# Patient Record
Sex: Male | Born: 1950 | Race: Black or African American | Hispanic: No | Marital: Married | Smoking: Never smoker
Health system: Southern US, Community
[De-identification: ages and names within clinical notes are randomized; demographics above are authoritative.]

## PROBLEM LIST (undated history)

## (undated) DIAGNOSIS — E0590 Thyrotoxicosis, unspecified without thyrotoxic crisis or storm: Secondary | ICD-10-CM

## (undated) DIAGNOSIS — I10 Essential (primary) hypertension: Secondary | ICD-10-CM

## (undated) DIAGNOSIS — K5730 Diverticulosis of large intestine without perforation or abscess without bleeding: Secondary | ICD-10-CM

## (undated) DIAGNOSIS — E119 Type 2 diabetes mellitus without complications: Secondary | ICD-10-CM

## (undated) DIAGNOSIS — D72819 Decreased white blood cell count, unspecified: Secondary | ICD-10-CM

## (undated) DIAGNOSIS — E7800 Pure hypercholesterolemia, unspecified: Secondary | ICD-10-CM

## (undated) DIAGNOSIS — Z86010 Personal history of colonic polyps: Secondary | ICD-10-CM

## (undated) HISTORY — DX: Pure hypercholesterolemia, unspecified: E78.00

## (undated) HISTORY — DX: Type 2 diabetes mellitus without complications: E11.9

## (undated) HISTORY — PX: COLONOSCOPY: SHX174

## (undated) HISTORY — DX: Essential (primary) hypertension: I10

## (undated) HISTORY — DX: Decreased white blood cell count, unspecified: D72.819

## (undated) HISTORY — DX: Personal history of colonic polyps: Z86.010

## (undated) HISTORY — DX: Thyrotoxicosis, unspecified without thyrotoxic crisis or storm: E05.90

## (undated) HISTORY — DX: Diverticulosis of large intestine without perforation or abscess without bleeding: K57.30

## (undated) HISTORY — PX: OTHER SURGICAL HISTORY: SHX169

## (undated) HISTORY — PX: BUNIONECTOMY: SHX129

---

## 2002-12-02 LAB — HM COLONOSCOPY

## 2004-08-03 ENCOUNTER — Ambulatory Visit: Payer: Self-pay | Admitting: Endocrinology

## 2004-08-08 ENCOUNTER — Ambulatory Visit: Payer: Self-pay | Admitting: Endocrinology

## 2005-07-01 ENCOUNTER — Ambulatory Visit: Payer: Self-pay | Admitting: Endocrinology

## 2005-07-19 ENCOUNTER — Ambulatory Visit: Payer: Self-pay | Admitting: Endocrinology

## 2005-07-25 ENCOUNTER — Ambulatory Visit: Payer: Self-pay | Admitting: Endocrinology

## 2005-11-19 ENCOUNTER — Ambulatory Visit: Payer: Self-pay | Admitting: Endocrinology

## 2005-11-26 ENCOUNTER — Ambulatory Visit: Payer: Self-pay | Admitting: Endocrinology

## 2006-02-25 ENCOUNTER — Ambulatory Visit: Payer: Self-pay | Admitting: Endocrinology

## 2006-10-16 ENCOUNTER — Ambulatory Visit: Payer: Self-pay | Admitting: Endocrinology

## 2006-10-22 ENCOUNTER — Ambulatory Visit: Payer: Self-pay | Admitting: Endocrinology

## 2006-10-22 LAB — CONVERTED CEMR LAB
Alkaline Phosphatase: 63 units/L (ref 39–117)
Basophils Absolute: 0 10*3/uL (ref 0.0–0.1)
Bilirubin Urine: NEGATIVE
Bilirubin, Direct: 0.2 mg/dL (ref 0.0–0.3)
Calcium: 8.9 mg/dL (ref 8.4–10.5)
Chloride: 103 meq/L (ref 96–112)
Cholesterol: 89 mg/dL (ref 0–200)
Creatinine, Ser: 1 mg/dL (ref 0.4–1.5)
Creatinine,U: 115.6 mg/dL
Eosinophils Absolute: 0.1 10*3/uL (ref 0.0–0.6)
Eosinophils Relative: 1.7 % (ref 0.0–5.0)
GFR calc non Af Amer: 82 mL/min
Glucose, Bld: 141 mg/dL — ABNORMAL HIGH (ref 70–99)
HCT: 38.3 % — ABNORMAL LOW (ref 39.0–52.0)
Hemoglobin, Urine: NEGATIVE
Hemoglobin: 13.2 g/dL (ref 13.0–17.0)
Hgb A1c MFr Bld: 8.1 % — ABNORMAL HIGH (ref 4.6–6.0)
LDL Cholesterol: 44 mg/dL (ref 0–99)
MCV: 90.6 fL (ref 78.0–100.0)
Neutrophils Relative %: 58.1 % (ref 43.0–77.0)
Nitrite: NEGATIVE
Potassium: 4 meq/L (ref 3.5–5.1)
Specific Gravity, Urine: 1.025 (ref 1.000–1.03)
Total Bilirubin: 1 mg/dL (ref 0.3–1.2)
Total Protein, Urine: NEGATIVE mg/dL
Total Protein: 6.5 g/dL (ref 6.0–8.3)
Triglycerides: 46 mg/dL (ref 0–149)
Urine Glucose: NEGATIVE mg/dL
Urobilinogen, UA: 0.2 (ref 0.0–1.0)
VLDL: 9 mg/dL (ref 0–40)
WBC: 3.2 10*3/uL — ABNORMAL LOW (ref 4.5–10.5)
pH: 5.5 (ref 5.0–8.0)

## 2006-10-28 ENCOUNTER — Ambulatory Visit: Payer: Self-pay | Admitting: Endocrinology

## 2007-01-27 ENCOUNTER — Ambulatory Visit: Payer: Self-pay | Admitting: Endocrinology

## 2007-01-27 LAB — CONVERTED CEMR LAB
Hgb A1c MFr Bld: 7.1 % — ABNORMAL HIGH (ref 4.6–6.0)
Testosterone: 301.5 ng/dL — ABNORMAL LOW (ref 350.00–890)

## 2007-04-01 ENCOUNTER — Encounter: Payer: Self-pay | Admitting: Endocrinology

## 2007-04-01 DIAGNOSIS — K5730 Diverticulosis of large intestine without perforation or abscess without bleeding: Secondary | ICD-10-CM

## 2007-04-01 DIAGNOSIS — I10 Essential (primary) hypertension: Secondary | ICD-10-CM

## 2007-04-01 DIAGNOSIS — Z860100 Personal history of colon polyps, unspecified: Secondary | ICD-10-CM

## 2007-04-01 HISTORY — DX: Diverticulosis of large intestine without perforation or abscess without bleeding: K57.30

## 2007-04-01 HISTORY — DX: Personal history of colon polyps, unspecified: Z86.0100

## 2007-04-01 HISTORY — DX: Personal history of colonic polyps: Z86.010

## 2007-04-01 HISTORY — DX: Essential (primary) hypertension: I10

## 2007-05-05 ENCOUNTER — Ambulatory Visit: Payer: Self-pay | Admitting: Endocrinology

## 2007-05-05 LAB — CONVERTED CEMR LAB
Hgb A1c MFr Bld: 6.7 % — ABNORMAL HIGH (ref 4.6–6.0)
Rubella: 92.8 intl units/mL — ABNORMAL HIGH
Rubeola IgG: 2.14 — ABNORMAL HIGH

## 2007-10-06 ENCOUNTER — Ambulatory Visit: Payer: Self-pay | Admitting: Endocrinology

## 2008-04-11 ENCOUNTER — Telehealth: Payer: Self-pay | Admitting: Endocrinology

## 2008-05-16 ENCOUNTER — Telehealth: Payer: Self-pay | Admitting: Endocrinology

## 2008-07-28 ENCOUNTER — Ambulatory Visit: Payer: Self-pay | Admitting: Endocrinology

## 2008-07-28 ENCOUNTER — Telehealth: Payer: Self-pay | Admitting: Internal Medicine

## 2008-07-28 DIAGNOSIS — D72819 Decreased white blood cell count, unspecified: Secondary | ICD-10-CM

## 2008-07-28 DIAGNOSIS — E7800 Pure hypercholesterolemia, unspecified: Secondary | ICD-10-CM

## 2008-07-28 HISTORY — DX: Decreased white blood cell count, unspecified: D72.819

## 2008-07-28 HISTORY — DX: Pure hypercholesterolemia, unspecified: E78.00

## 2008-11-24 ENCOUNTER — Ambulatory Visit: Payer: Self-pay | Admitting: Endocrinology

## 2008-11-24 DIAGNOSIS — E119 Type 2 diabetes mellitus without complications: Secondary | ICD-10-CM

## 2008-11-24 HISTORY — DX: Type 2 diabetes mellitus without complications: E11.9

## 2008-11-24 LAB — CONVERTED CEMR LAB: Hgb A1c MFr Bld: 8.7 % — ABNORMAL HIGH (ref 4.6–6.5)

## 2008-11-28 ENCOUNTER — Telehealth (INDEPENDENT_AMBULATORY_CARE_PROVIDER_SITE_OTHER): Payer: Self-pay | Admitting: *Deleted

## 2009-08-10 ENCOUNTER — Telehealth: Payer: Self-pay | Admitting: Endocrinology

## 2009-11-09 ENCOUNTER — Ambulatory Visit: Payer: Self-pay | Admitting: Endocrinology

## 2009-11-09 DIAGNOSIS — E0590 Thyrotoxicosis, unspecified without thyrotoxic crisis or storm: Secondary | ICD-10-CM

## 2009-11-09 HISTORY — DX: Thyrotoxicosis, unspecified without thyrotoxic crisis or storm: E05.90

## 2009-11-09 LAB — CONVERTED CEMR LAB
ALT: 19 units/L (ref 0–53)
Albumin: 4 g/dL (ref 3.5–5.2)
Alkaline Phosphatase: 95 units/L (ref 39–117)
BUN: 13 mg/dL (ref 6–23)
Basophils Absolute: 0 10*3/uL (ref 0.0–0.1)
Bilirubin, Direct: 0.1 mg/dL (ref 0.0–0.3)
Chloride: 97 meq/L (ref 96–112)
Cholesterol: 90 mg/dL (ref 0–200)
Creatinine, Ser: 1 mg/dL (ref 0.4–1.5)
Eosinophils Absolute: 0 10*3/uL (ref 0.0–0.7)
Glucose, Bld: 396 mg/dL — ABNORMAL HIGH (ref 70–99)
HCT: 39.5 % (ref 39.0–52.0)
HDL: 37.3 mg/dL — ABNORMAL LOW (ref >39.00)
Hemoglobin, Urine: NEGATIVE
Leukocytes, UA: NEGATIVE
Lymphs Abs: 1.1 10*3/uL (ref 0.7–4.0)
MCV: 94.1 fL (ref 78.0–100.0)
Neutro Abs: 2 10*3/uL (ref 1.4–7.7)
Neutrophils Relative %: 58.5 % (ref 43.0–77.0)
Nitrite: NEGATIVE
Potassium: 4 meq/L (ref 3.5–5.1)
Total Protein: 7 g/dL (ref 6.0–8.3)
Urine Glucose: >=1000 mg/dL
pH: 5 (ref 5.0–8.0)

## 2009-11-13 ENCOUNTER — Telehealth (INDEPENDENT_AMBULATORY_CARE_PROVIDER_SITE_OTHER): Payer: Self-pay | Admitting: *Deleted

## 2009-11-17 ENCOUNTER — Ambulatory Visit: Payer: Self-pay | Admitting: Endocrinology

## 2009-12-05 ENCOUNTER — Encounter (HOSPITAL_COMMUNITY): Admission: RE | Admit: 2009-12-05 | Discharge: 2010-03-05 | Payer: Self-pay | Admitting: Endocrinology

## 2010-01-11 ENCOUNTER — Ambulatory Visit: Payer: Self-pay | Admitting: Endocrinology

## 2010-04-12 ENCOUNTER — Ambulatory Visit: Payer: Self-pay | Admitting: Endocrinology

## 2010-04-12 LAB — CONVERTED CEMR LAB: TSH: 1.39 microintl units/mL (ref 0.35–5.50)

## 2010-04-26 ENCOUNTER — Encounter: Payer: Self-pay | Admitting: Endocrinology

## 2010-07-05 ENCOUNTER — Ambulatory Visit: Payer: Self-pay | Admitting: Endocrinology

## 2010-07-05 LAB — CONVERTED CEMR LAB: Hgb A1c MFr Bld: 8.6 % — ABNORMAL HIGH (ref 4.6–6.5)

## 2010-07-06 ENCOUNTER — Telehealth: Payer: Self-pay | Admitting: Endocrinology

## 2010-08-09 ENCOUNTER — Ambulatory Visit: Payer: Self-pay | Admitting: Internal Medicine

## 2010-08-09 DIAGNOSIS — M79609 Pain in unspecified limb: Secondary | ICD-10-CM

## 2010-09-16 LAB — CONVERTED CEMR LAB
Albumin: 4.3 g/dL (ref 3.5–5.2)
Bilirubin Urine: NEGATIVE
CO2: 32 meq/L (ref 19–32)
Calcium: 9.4 mg/dL (ref 8.4–10.5)
Chloride: 100 meq/L (ref 96–112)
Creatinine, Ser: 1.1 mg/dL (ref 0.4–1.5)
Creatinine,U: 37.2 mg/dL
GFR calc Af Amer: 89 mL/min
Glucose, Bld: 366 mg/dL — ABNORMAL HIGH (ref 70–99)
Hemoglobin, Urine: NEGATIVE
Hgb A1c MFr Bld: 8.4 % — ABNORMAL HIGH (ref 4.6–6.0)
Ketones, ur: NEGATIVE mg/dL
Leukocytes, UA: NEGATIVE
MCHC: 34.7 g/dL (ref 30.0–36.0)
MCV: 89.8 fL (ref 78.0–100.0)
Monocytes Absolute: 0.2 10*3/uL (ref 0.1–1.0)
Monocytes Relative: 7.7 % (ref 3.0–12.0)
Mucus, UA: NEGATIVE
Neutrophils Relative %: 57.7 % (ref 43.0–77.0)
Nitrite: NEGATIVE
PSA: 0.17 ng/mL (ref 0.10–4.00)
Platelets: 157 10*3/uL (ref 150–400)
RDW: 13.8 % (ref 11.5–14.6)
Sodium: 139 meq/L (ref 135–145)
TSH: 1.29 microintl units/mL (ref 0.35–5.50)
Total CHOL/HDL Ratio: 4
Total Protein, Urine: NEGATIVE mg/dL
Triglycerides: 198 mg/dL — ABNORMAL HIGH (ref 0–149)
pH: 6.5 (ref 5.0–8.0)

## 2010-09-18 NOTE — Assessment & Plan Note (Signed)
Summary: FU [facility]   Vital Signs:  Patient profile:   60 year old male Height:      66 inches (167.64 cm) Weight:      161.50 pounds (73.41 kg) BMI:     26.16 O2 Sat:      99 % on Room air Temp:     98.6 degrees F (37.00 degrees C) oral Pulse rate:   98 / minute BP sitting:   112 / 70  (left arm) Cuff size:   regular  Vitals Entered By: Josph Macho RMA (November 09, 2009 4:50 PM) Taken by Sydell Axon SMA  O2 Flow:  Room air CC: 2 week follow up. pt states he is no longer B complex caps, monopril, ciprofloxacin or mefloquine //[place]   CC:  2 week follow up. pt states he is no longer B complex caps, monopril, and ciprofloxacin or mefloquine //[place].  History of Present Illness: the status of 3 chronic medical problems is addressed today: no cbg record, but states cbg's are 200's in am.   he says the abscess on the right buttock is much better. he takes pravachol as rx'ed, and has lost a few lbs.   Current Medications (verified): 1)  B Complex   Caps (B Complex Vitamins) .... Take 1 By Mouth Qd 2)  Monopril 20 Mg  Tabs (Fosinopril Sodium) .... Tale 1 By Mouth Qd 3)  Pravastatin Sodium 40 Mg  Tabs (Pravastatin Sodium) .... 2 By Mouth At Bedtime 4)  Onetouch Ultra Test   Strp (Glucose Blood) .... Check Blood Sugar Once A Day 5)  Amaryl 4 Mg  Tabs (Glimepiride) .... 2 Tabs Qam 6)  Metformin Hcl 500 Mg Xr24h-Tab (Metformin Hcl) .... Take 2 Two Times A Day Need Office Visit For Addtional Refills 7)  Mefloquine Hcl 250 Mg Tabs (Mefloquine Hcl) .... Q Week 8)  Ciprofloxacin Hcl 500 Mg Tabs (Ciprofloxacin Hcl) .... Bid 9)  Actos 45 Mg Tabs (Pioglitazone Hcl) .Marland Kitchen.. 1 Qd  Allergies (verified): No Known Drug Allergies  Past History:  Past Medical History: Last updated: 07/28/2008 Dyslipidemia Leukopenia HYPERTENSION (ICD-401.9) DIVERTICULOSIS, COLON (ICD-562.10) DIABETES MELLITUS, TYPE I (ICD-250.01) COLONIC POLYPS, HX OF (ICD-V12.72)  Family History: Reviewed history from  07/28/2008 and no changes required. a brother has type 2 dm  Social History: Reviewed history from 07/28/2008 and no changes required. 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  Review of Systems  The patient denies chest pain and dyspnea on exertion.    Physical Exam  General:  obese.  no distress  Pulses:  dorsalis pedis intact bilat.   Extremities:  no deformity.  no ulcer on the feet.  feet are of normal color and temp.  no edema.  Neurologic:  sensation is intact to touch on the feet. Skin:  the abcess at the right buttock has resolved, and there is now only a slight irregularity in the skin. Additional Exam:   Hemoglobin A1C       [H]  10.5 %  FastTSH              [L]  0.04 uIU/mL      Impression & Recommendations:  Problem # 1:  DM (ICD-250.00) Assessment Deteriorated  Problem # 2:  abscess, buttock resolved  Problem # 3:  HYPERCHOLESTEROLEMIA (ICD-272.0) well-controlled  Problem # 4:  HYPERTHYROIDISM (ICD-242.90) Assessment: New  Medications Added to Medication List This Visit: 1)  Glimepiride 4 Mg Tabs (Glimepiride) .Marland Kitchen.. 1 bid  Other Orders: TLB-A1C /  Hgb A1C (Glycohemoglobin) (83036-A1C) TLB-Microalbumin/Creat Ratio, Urine (82043-MALB) TLB-Lipid Panel (80061-LIPID) TLB-BMP (Basic Metabolic Panel-BMET) (80048-METABOL) TLB-CBC Platelet - w/Differential (85025-CBCD) TLB-Hepatic/Liver Function Pnl (80076-HEPATIC) TLB-TSH (Thyroid Stimulating Hormone) (84443-TSH) TLB-PSA (Prostate Specific Antigen) (84153-PSA) TLB-Udip w/ Micro (81001-URINE) Est. Patient Level IV (04540)  Patient Instructions: 1)  tests are being ordered for you today.  a few days after the test(s), please call [phone] to hear your test results. 2)  pending the test results, please ad-back the glimepiride 4 mg two times a day 3)  check your blood sugar 1 time a day.  vary the time of day when you check, between before the 3 meals, and at bedtime.  also check  if you have symptoms of your blood sugar being too high or too low.  please keep a record of the readings and bring it to your next appointment here.  please call us sooner if you are having low blood sugar episodes. 4)  physical soon 5)  (update: i called pt 11/12/09.  wrong #.  i left message on phone-tree:  ov this week to address thyroid and dm). Prescriptions: GLIMEPIRIDE 4 MG TABS (GLIMEPIRIDE) 1 bid  #60 x 11   Entered and Authorized by:   Minus Breeding MD   Signed by:   Minus Breeding MD on 11/09/2009   Method used:   Electronically to        [facility] [phone]* (retail)       [address]       [place], [place]  [address]       Ph: [phone]       Fax: [phone]   RxID:   [id number]

## 2010-09-18 NOTE — Assessment & Plan Note (Signed)
Summary: f/u per appt/cd   Vital Signs:  Patient profile:   60 year old male Height:      66 inches (167.64 cm) Weight:      171.25 pounds (77.84 kg) O2 Sat:      98 % on Room air Temp:     98.4 degrees F (36.89 degrees C) oral Pulse rate:   97 / minute BP sitting:   126 / 70  (left arm) Cuff size:   regular  Vitals Entered By: Josph Macho RMA (Jan 11, 2010 3:59 PM)  O2 Flow:  Room air CC: Follow-up visit/ CF Is Patient Diabetic? Yes   CC:  Follow-up visit/ CF.  History of Present Illness: he brings a record of his cbg's which i have reviewed today.  it varies from 67 (afternoon) to 100 (other times). pt says he has never had a thyroid problem before, and he has no sxs of this.    Current Medications (verified): 1)  Pravastatin Sodium 40 Mg  Tabs (Pravastatin Sodium) .... 2 By Mouth At Bedtime 2)  Onetouch Ultra Test   Strp (Glucose Blood) .... Check Blood Sugar Once A Day 3)  Metformin Hcl 500 Mg Xr24h-Tab (Metformin Hcl) .... Take 2 Two Times A Day Need Office Visit For Addtional Refills 4)  Actos 45 Mg Tabs (Pioglitazone Hcl) .Marland Kitchen.. 1 Qd 5)  Glimepiride 4 Mg Tabs (Glimepiride) .Marland Kitchen.. 1 Bid 6)  Bromocriptine Mesylate 2.5 Mg Tabs (Bromocriptine Mesylate) .... 1/2 Tab At Bedtime  Allergies (verified): No Known Drug Allergies  Past History:  Past Medical History: Last updated: 07/28/2008 Dyslipidemia Leukopenia HYPERTENSION (ICD-401.9) DIVERTICULOSIS, COLON (ICD-562.10) DIABETES MELLITUS, TYPE I (ICD-250.01) COLONIC POLYPS, HX OF (ICD-V12.72)  Physical Exam  Additional Exam:  FastTSH                   1.11 uIU/mL    Impression & Recommendations:  Problem # 1:  DM (ICD-250.00) needs increased rx  Problem # 2:  HYPERTHYROIDISM (ICD-242.90) uncertain etiology resolved  Medications Added to Medication List This Visit: 1)  Glimepiride 4 Mg Tabs (Glimepiride) .Marland Kitchen.. 1 tab each am  Other Orders: TLB-TSH (Thyroid Stimulating Hormone) (84443-TSH) TLB-T4  (Thyrox), Free [phone]) Est. Patient Level III (40981)  Patient Instructions: 1)  reduce glimepiride to 4 mg each am only.  call if low-blood sugar recurs, so we can reduce it further. 2)  blood tests are being ordered for you today.  please call [phone] to hear your test results. 3)  Please schedule a follow-up appointment in 3 months. 4)  (update: i left message on phone-tree:  we'll recheck tsh in the future).

## 2010-09-18 NOTE — Assessment & Plan Note (Signed)
Summary: f/u appt/ per christy/cd   Vital Signs:  Patient profile:   60 year old male Height:      66 inches (167.64 cm) Weight:      163.50 pounds (74.32 kg) O2 Sat:      97 % on Room air Temp:     97.0 degrees F (36.11 degrees C) oral Pulse rate:   101 / minute BP sitting:   118 / 64  (left arm) Cuff size:   regular  Vitals Entered By: Josph Macho RMA (November 17, 2009 8:42 AM)  O2 Flow:  Room air CC: Follow-up visit/ CF Is Patient Diabetic? Yes   CC:  Follow-up visit/ CF.  History of Present Illness: pt says he has never had any thyroid condition before.   he does not check cbg's.  he just restarted the amaryl.  Current Medications (verified): 1)  Pravastatin Sodium 40 Mg  Tabs (Pravastatin Sodium) .... 2 By Mouth At Bedtime 2)  Onetouch Ultra Test   Strp (Glucose Blood) .... Check Blood Sugar Once A Day 3)  Amaryl 4 Mg  Tabs (Glimepiride) .... 2 Tabs Qam 4)  Metformin Hcl 500 Mg Xr24h-Tab (Metformin Hcl) .... Take 2 Two Times A Day Need Office Visit For Addtional Refills 5)  Actos 45 Mg Tabs (Pioglitazone Hcl) .Marland Kitchen.. 1 Qd 6)  Glimepiride 4 Mg Tabs (Glimepiride) .Marland Kitchen.. 1 Bid  Allergies (verified): No Known Drug Allergies  Past History:  Past Medical History: Last updated: 07/28/2008 Dyslipidemia Leukopenia HYPERTENSION (ICD-401.9) DIVERTICULOSIS, COLON (ICD-562.10) DIABETES MELLITUS, TYPE I (ICD-250.01) COLONIC POLYPS, HX OF (ICD-V12.72)  Family History: Reviewed history from 11/09/2009 and no changes required. a brother has type 2 dm  Review of Systems       he ahs lost a few lbs, due to his efforts  Physical Exam  General:  normal appearance.   Neck:  Supple without thyroid enlargement or tenderness.    Impression & Recommendations:  Problem # 1:  HYPERTHYROIDISM (ICD-242.90) Assessment New uncertain etiology  Problem # 2:  DM (ICD-250.00) therapy limited by noncompliance.  i'll do the best i can.  Medications Added to Medication List This  Visit: 1)  Bromocriptine Mesylate 2.5 Mg Tabs (Bromocriptine mesylate) .... 1/2 tab at bedtime  Other Orders: Radiology Referral (Radiology) Est. Patient Level III (91478)  Patient Instructions: 1)  we discussed the causes, risks, and treatment options of hyperthyroidism. 2)  let's do a thyroid "scan," (a special but easy type of thyroid x ray, done at the hospital). 3)  tests are being ordered for you today.  a few days after the test(s), please call [phone] to hear your test results. 4)  add bromocriptine 1/2 of 2.5 mg at bedtime 5)  return a few days after the thyroid x ray. 6)  check your blood sugar 2 times a day.  vary the time of day when you check, between before the 3 meals, and at bedtime.  also check if you have symptoms of your blood sugar being too high or too low.  please keep a record of the readings and bring it to your next appointment here.  please call us sooner if you are having low blood sugar episodes. Prescriptions: BROMOCRIPTINE MESYLATE 2.5 MG TABS (BROMOCRIPTINE MESYLATE) 1/2 tab at bedtime  #15 x 11   Entered and Authorized by:   Minus Breeding MD   Signed by:   Minus Breeding MD on 11/17/2009   Method used:   Electronically to  [facility] [address] [phone]* (retail)       [address]       [place], [place]  [address]       Ph: [phone]       Fax: [phone]   RxID:   [id number]

## 2010-09-18 NOTE — Letter (Signed)
Summary: Generic Letter  [facility]  [address] [place], [place] [address]   Phone: [phone]  Fax: [phone]    04/12/2010  [facility] [address] [place], [place]  [address]  Dear Mr. Lamprecht,   You received the 2011 flu shot in our office today.    Sincerely,   Romero Belling MD

## 2010-09-18 NOTE — Letter (Signed)
Summary: [facility]   Imported By: Sherian Rein 04/30/2010 15:04:58  _____________________________________________________________________  External Attachment:    Type:   Image     Comment:   External Document

## 2010-09-18 NOTE — Assessment & Plan Note (Signed)
Summary: FU--PER PT---STC   Vital Signs:  Patient profile:   60 year old male Height:      66 inches (167.64 cm) Weight:      168.38 pounds (76.54 kg) BMI:     27.28 O2 Sat:      97 % on Room air Temp:     97.6 degrees F (36.44 degrees C) oral Pulse rate:   76 / minute BP sitting:   118 / 78  (left arm) Cuff size:   regular  Vitals Entered By: Brenton Grills MA (April 12, 2010 4:21 PM)  O2 Flow:  Room air CC: F/u appt/aj Is Patient Diabetic? Yes   CC:  F/u appt/aj.  History of Present Illness: no cbg record, but states cbg's are well-controlled.  pt states he feels well in general.  he will go to [place] soon.  Current Medications (verified): 1)  Pravastatin Sodium 40 Mg  Tabs (Pravastatin Sodium) .... 2 By Mouth At Bedtime 2)  Onetouch Ultra Test   Strp (Glucose Blood) .... Check Blood Sugar Once A Day 3)  Metformin Hcl 500 Mg Xr24h-Tab (Metformin Hcl) .... Take 2 Two Times A Day 4)  Actos 45 Mg Tabs (Pioglitazone Hcl) .Marland Kitchen.. 1 Qd 5)  Glimepiride 4 Mg Tabs (Glimepiride) .Marland Kitchen.. 1 Tab Each Am 6)  Bromocriptine Mesylate 2.5 Mg Tabs (Bromocriptine Mesylate) .... 1/2 Tab At Bedtime 7)  Aspirin 81 Mg Tbec (Aspirin) .Marland Kitchen.. 1 By Mouth Once Daily 8)  Centrum Silver Ultra Mens  Tabs (Multiple Vitamins-Minerals) .Marland Kitchen.. 1 By Mouth Once Daily  Allergies (verified): No Known Drug Allergies  Past History:  Past Medical History: Last updated: 07/28/2008 Dyslipidemia Leukopenia HYPERTENSION (ICD-401.9) DIVERTICULOSIS, COLON (ICD-562.10) DIABETES MELLITUS, TYPE I (ICD-250.01) COLONIC POLYPS, HX OF (ICD-V12.72)  Social History: Reviewed history from 11/09/2009 and no changes required. 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  Review of Systems  The patient denies hypoglycemia.    Physical Exam  General:  normal appearance.   Pulses:  dorsalis pedis intact bilat.   Extremities:  no deformity.  no ulcer on the feet.  feet are of normal color  and temp.  no edema.  Neurologic:  sensation is intact to touch on the feet. Additional Exam:  Hemoglobin A1C       [H]  7.3 %    Impression & Recommendations:  Problem # 1:  DM (ICD-250.00) this is the best control this pt should aim for, given this sulfonylurea-containing regimen  Medications Added to Medication List This Visit: 1)  Aspirin 81 Mg Tbec (Aspirin) .Marland Kitchen.. 1 by mouth once daily 2)  Centrum Silver Ultra Mens Tabs (Multiple vitamins-minerals) .Marland Kitchen.. 1 by mouth once daily 3)  Ciprofloxacin Hcl 500 Mg Tabs (Ciprofloxacin hcl) .Marland Kitchen.. 1 tab two times a day  Other Orders: Admin 1st Vaccine ([address]) Flu Vaccine 73yrs + ([address]) TLB-TSH (Thyroid Stimulating Hormone) (84443-TSH) TLB-A1C / Hgb A1C (Glycohemoglobin) (83036-A1C) Est. Patient Level III (09811)  Patient Instructions: 1)  blood tests are being ordered for you today.  please call [phone] to hear your test results. 2)  pending the test results, please continue the same medications for now. 3)  Please schedule a follow-up appointment in 3 months. 4)  if you get traveler's diarrhea, take cipro 500 mg two times a day. Prescriptions: CIPROFLOXACIN HCL 500 MG TABS (CIPROFLOXACIN HCL) 1 tab two times a day  #14 x 0   Entered and Authorized by:   Minus Breeding MD   Signed  by:   Minus Breeding MD on 04/12/2010   Method used:   Electronically to        [facility] [facility] [address] [id number] (retail)       [address]       [place], [place]  [address]       Ph: [phone]       Fax: [phone]   RxID:   [id number]     Flu Vaccine Consent Questions:    Do you have a history of severe allergic reactions to this vaccine? no    Any prior history of allergic reactions to egg and/or gelatin? no    Do you have a sensitivity to the preservative Thimersol? no    Do you have a past history of Guillan-Barre Syndrome? no    Do you currently have an acute febrile illness? no    Have you ever had a severe reaction  to latex? no    Vaccine information given and explained to patient? yes Flu Vaccine Consent Questions     Do you have a history of severe allergic reactions to this vaccine? no    Any prior history of allergic reactions to egg and/or gelatin? no    Do you have a sensitivity to the preservative Thimersol? no    Do you have a past history of Guillan-Barre Syndrome? no    Do you currently have an acute febrile illness? no    Have you ever had a severe reaction to latex? no    Vaccine information given and explained to patient? yes    Are you currently pregnant? no    Lot Number:[id number]   Exp Date:02/16/2011   Site Given  Left Deltoid IMlu

## 2010-09-18 NOTE — Progress Notes (Signed)
Summary: Call per MD  Phone Note Outgoing Call Call back at Home Phone [phone]   Call placed by: Sherese Christopher  Call placed to: Patient Summary of Call: Left message on vm for pt to schedule f/u appt this week per SAE. Pt's BS was high, and thyroid was off.  Sydell Axon  November 13, 2009 4:54 PM   Follow-up for Phone Call        Informed pt and transferred call to [facility] to schedule appt. Follow-up by: Josph Macho RMA,  November 14, 2009 8:54 AM

## 2010-09-18 NOTE — Assessment & Plan Note (Signed)
Summary: f/u appt/cd   Vital Signs:  Patient profile:   60 year old male Height:      66 inches (167.64 cm) Weight:      169.38 pounds (76.99 kg) BMI:     27.44 O2 Sat:      97 % on Room air Temp:     98.9 degrees F (37.17 degrees C) oral Pulse rate:   91 / minute BP sitting:   110 / 74  (left arm) Cuff size:   regular  Vitals Entered By: Brenton Grills CMA Duncan Dull) (July 05, 2010 3:39 PM)  O2 Flow:  Room air CC: Follow-up visit/aj Is Patient Diabetic? Yes   CC:  Follow-up visit/aj.  History of Present Illness: no cbg record, but states cbg's are well-controlled.  pt states he feels well in general.   pt has a few weeks of dry quality cough, which is improving.  no assoc sob.    Current Medications (verified): 1)  Pravastatin Sodium 40 Mg  Tabs (Pravastatin Sodium) .... 2 By Mouth At Bedtime 2)  Onetouch Ultra Test   Strp (Glucose Blood) .... Check Blood Sugar Once A Day 3)  Metformin Hcl 500 Mg Xr24h-Tab (Metformin Hcl) .... Take 2 Two Times A Day 4)  Actos 45 Mg Tabs (Pioglitazone Hcl) .Marland Kitchen.. 1 Qd 5)  Glimepiride 4 Mg Tabs (Glimepiride) .Marland Kitchen.. 1 Tab Each Am 6)  Bromocriptine Mesylate 2.5 Mg Tabs (Bromocriptine Mesylate) .... 1/2 Tab At Bedtime 7)  Aspirin 81 Mg Tbec (Aspirin) .Marland Kitchen.. 1 By Mouth Once Daily 8)  Centrum Silver Ultra Mens  Tabs (Multiple Vitamins-Minerals) .Marland Kitchen.. 1 By Mouth Once Daily 9)  Ciprofloxacin Hcl 500 Mg Tabs (Ciprofloxacin Hcl) .Marland Kitchen.. 1 Tab Two Times A Day 10)  Clarinex-D 12 Hour 2.5-120 Mg Xr12h-Tab (Desloratadine-Pseudoephedrine) .Marland Kitchen.. 1 By Mouth As Needed  Allergies (verified): No Known Drug Allergies  Past History:  Past Medical History: Last updated: 07/28/2008 Dyslipidemia Leukopenia HYPERTENSION (ICD-401.9) DIVERTICULOSIS, COLON (ICD-562.10) DIABETES MELLITUS, TYPE I (ICD-250.01) COLONIC POLYPS, HX OF (ICD-V12.72)  Review of Systems  The patient denies fever and dyspnea on exertion.    Physical Exam  General:  normal appearance.     Head:  head: no deformity eyes: no periorbital swelling, no proptosis external nose and ears are normal mouth: no lesion seen Lungs:  Clear to auscultation bilaterally. Normal respiratory effort.  Additional Exam:  Hemoglobin A1C       [H]  8.6 %      Impression & Recommendations:  Problem # 1:  DM (ICD-250.00) needs increased rx  Problem # 2:  acute bronchitis new  Medications Added to Medication List This Visit: 1)  Bromocriptine Mesylate 2.5 Mg Tabs (Bromocriptine mesylate) .Marland Kitchen.. 1 tab two times a day 2)  Clarinex-d 12 Hour 2.5-120 Mg Xr12h-tab (Desloratadine-pseudoephedrine) .Marland Kitchen.. 1 by mouth as needed 3)  Benzonatate 200 Mg Caps (Benzonatate) .Marland Kitchen.. 1 tab three times a day as needed for cough  Other Orders: TLB-A1C / Hgb A1C (Glycohemoglobin) (83036-A1C) Est. Patient Level IV ([address])  Patient Instructions: 1)  blood tests are being ordered for you today.  please call [phone] to hear your test results. 2)  benzonatate 200 mg three times a day as needed for cough. 3)  Please schedule a physical appointment in 3 months. 4)  (update: i left message on phone-tree:  increase parlodel to 2.5 mg at bedtime). Prescriptions: BROMOCRIPTINE MESYLATE 2.5 MG TABS (BROMOCRIPTINE MESYLATE) 1 tab two times a day  #60 x 11   Entered and Authorized by:  Minus Breeding MD   Signed by:   Minus Breeding MD on 07/05/2010   Method used:   [facility]  [address] [facility] [address] [phone]* (retail)       [address]       [place], [place]  [address]       Ph: [phone]       Fax: [phone]   RxID:   [id number] BENZONATATE 200 MG CAPS (BENZONATATE) 1 tab three times a day as needed for cough  #30 x 0   Entered and Authorized by:   Minus Breeding MD   Signed by:   Minus Breeding MD on 07/05/2010   Method used:   [facility] [phone]* (retail)       [address]       [place], [place]  [address]       Ph:  [phone]       Fax: [phone]   RxID:   [id number]    Orders Added: 1)  TLB-A1C / Hgb A1C (Glycohemoglobin) [83036-A1C] 2)  Est. Patient Level IV [30160]

## 2010-09-18 NOTE — Progress Notes (Signed)
Summary: Pharmacy change  Phone Note Call from Patient Call back at Home Phone [phone]   Caller: Patient Summary of Call: Pt called requesting Rxs be resent to [facility]. Rx resent and pt notified. Initial call taken by: Margaret Pyle, CMA,  July 06, 2010 10:06 AM    Prescriptions: BROMOCRIPTINE MESYLATE 2.5 MG TABS (BROMOCRIPTINE MESYLATE) 1 tab two times a day  #60 x 11   Entered by:   Margaret Pyle, CMA   Authorized by:   Minus Breeding MD   Signed by:   Margaret Pyle, CMA on 07/06/2010   Method used:   Electronically to        [facility] [phone]* (retail)       [address]       [place], [place]  [address]       Ph: [phone]       Fax: [phone]   RxID:   [id number] BENZONATATE 200 MG CAPS (BENZONATATE) 1 tab three times a day as needed for cough  #30 x 0   Entered by:   Margaret Pyle, CMA   Authorized by:   Minus Breeding MD   Signed by:   Margaret Pyle, CMA on 07/06/2010   Method used:   Electronically to        [facility] [phone]* (retail)       [address]       [place], [place]  [address]       Ph: [phone]       Fax: [phone]   RxID:   [id number]

## 2010-09-19 ENCOUNTER — Telehealth (INDEPENDENT_AMBULATORY_CARE_PROVIDER_SITE_OTHER): Payer: Self-pay | Admitting: *Deleted
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
## 2010-09-26 NOTE — Progress Notes (Signed)
  Phone Note Other Incoming   Request: Send information Summary of Call: Request for records received from Automated Record Collection.Request forwarded to [facility].  December 15,2011 to present.

## 2010-10-18 ENCOUNTER — Ambulatory Visit (INDEPENDENT_AMBULATORY_CARE_PROVIDER_SITE_OTHER): Payer: PRIVATE HEALTH INSURANCE | Admitting: Endocrinology

## 2010-10-18 ENCOUNTER — Other Ambulatory Visit: Payer: PRIVATE HEALTH INSURANCE

## 2010-10-18 ENCOUNTER — Encounter: Payer: Self-pay | Admitting: Endocrinology

## 2010-10-18 ENCOUNTER — Other Ambulatory Visit: Payer: Self-pay | Admitting: Endocrinology

## 2010-10-18 DIAGNOSIS — E119 Type 2 diabetes mellitus without complications: Secondary | ICD-10-CM

## 2010-10-18 LAB — HEMOGLOBIN A1C: Hgb A1c MFr Bld: 7.9 % — ABNORMAL HIGH (ref 4.6–6.5)

## 2010-10-30 NOTE — Assessment & Plan Note (Signed)
Summary: FOLLOW UP /NWS   Vital Signs:  Patient profile:   60 year old male Weight:      172 pounds (78.18 kg) O2 Sat:      98 % on Room air Temp:     98.4 degrees F (36.89 degrees C) oral Pulse rate:   93 / minute BP sitting:   120 / 68  (left arm) Cuff size:   regular  Vitals Entered By: Orlan Leavens RMA (October 18, 2010 3:57 PM)  O2 Flow:  Room air CC: follow-up visit Is Patient Diabetic? Yes Did you bring your meter with you today? Yes Pain Assessment Patient in pain? no        CC:  follow-up visit.  History of Present Illness: pt says his diet is good, but exercise is not.  no cbg record, but states cbg's are mostly in the mod-100's.    he took insulin in the past, but that was a few years ago.    Current Medications (verified): 1)  Pravastatin Sodium 40 Mg  Tabs (Pravastatin Sodium) .... 2 By Mouth At Bedtime 2)  Onetouch Ultra Test   Strp (Glucose Blood) .... Check Blood Sugar Once A Day 3)  Metformin Hcl 500 Mg Xr24h-Tab (Metformin Hcl) .... Take 2 Two Times A Day 4)  Actos 45 Mg Tabs (Pioglitazone Hcl) .Marland Kitchen.. 1 Qd 5)  Glimepiride 4 Mg Tabs (Glimepiride) .Marland Kitchen.. 1 Tab Each Am 6)  Bromocriptine Mesylate 2.5 Mg Tabs (Bromocriptine Mesylate) .Marland Kitchen.. 1 Tab Two Times A Day 7)  Aspirin 81 Mg Tbec (Aspirin) .Marland Kitchen.. 1 By Mouth Once Daily 8)  Centrum Silver Ultra Mens  Tabs (Multiple Vitamins-Minerals) .Marland Kitchen.. 1 By Mouth Once Daily 9)  Clarinex-D 12 Hour 2.5-120 Mg Xr12h-Tab (Desloratadine-Pseudoephedrine) .Marland Kitchen.. 1 By Mouth As Needed 10)  Benzonatate 200 Mg Caps (Benzonatate) .Marland Kitchen.. 1 Tab Three Times A Day As Needed For Cough  Allergies (verified): No Known Drug Allergies  Past History:  Past Medical History: Last updated: 07/28/2008 Dyslipidemia Leukopenia HYPERTENSION (ICD-401.9) DIVERTICULOSIS, COLON (ICD-562.10) DIABETES MELLITUS, TYPE I (ICD-250.01) COLONIC POLYPS, HX OF (ICD-V12.72)  Review of Systems  The patient denies hypoglycemia.    Physical Exam  General:  normal  appearance.   Pulses:  dorsalis pedis intact bilat.   Extremities:  no deformity.  no ulcer on the feet.  feet are of normal color and temp.  no edema.  Neurologic:  sensation is intact to touch on the feet. Additional Exam:  Hemoglobin A1C       [H]  7.9 %    Impression & Recommendations:  Problem # 1:  DM (ICD-250.00) needs increased rx  Medications Added to Medication List This Visit: 1)  Januvia 100 Mg Tabs (Sitagliptin phosphate) .Marland Kitchen.. 1 tab each am  Other Orders: TLB-A1C / Hgb A1C (Glycohemoglobin) (83036-A1C) Est. Patient Level III (27062)  Patient Instructions: 1)  blood tests are being ordered for you today.  please call [phone] to hear your test results. 2)  pending the test results, please add januvia 100 mg each am.  here is a discount card. 3)  Please schedule a physical appointment in 3 months. 4)  (update: i left message on phone-tree:  rx as we discussed) Prescriptions: JANUVIA 100 MG TABS (SITAGLIPTIN PHOSPHATE) 1 tab each am  #30 x 11   Entered and Authorized by:   Minus Breeding MD   Signed by:   Minus Breeding MD on 10/18/2010   Method used:   Print then Give to Patient  RxID:   [id number]    Orders Added: 1)  TLB-A1C / Hgb A1C (Glycohemoglobin) [83036-A1C] 2)  Est. Patient Level III [14782]

## 2011-01-15 ENCOUNTER — Other Ambulatory Visit: Payer: Self-pay | Admitting: Endocrinology

## 2011-01-31 ENCOUNTER — Other Ambulatory Visit: Payer: Self-pay | Admitting: Endocrinology

## 2011-01-31 ENCOUNTER — Other Ambulatory Visit (INDEPENDENT_AMBULATORY_CARE_PROVIDER_SITE_OTHER): Payer: PRIVATE HEALTH INSURANCE

## 2011-01-31 DIAGNOSIS — E119 Type 2 diabetes mellitus without complications: Secondary | ICD-10-CM

## 2011-01-31 DIAGNOSIS — Z0000 Encounter for general adult medical examination without abnormal findings: Secondary | ICD-10-CM

## 2011-01-31 LAB — URINALYSIS
Ketones, ur: NEGATIVE
Leukocytes, UA: NEGATIVE
pH: 7 (ref 5.0–8.0)

## 2011-01-31 LAB — CBC WITH DIFFERENTIAL/PLATELET
Basophils Absolute: 0 10*3/uL (ref 0.0–0.1)
Eosinophils Absolute: 0.1 10*3/uL (ref 0.0–0.7)
HCT: 39.1 % (ref 39.0–52.0)
Hemoglobin: 13.2 g/dL (ref 13.0–17.0)
MCHC: 33.9 g/dL (ref 30.0–36.0)
Monocytes Absolute: 0.3 10*3/uL (ref 0.1–1.0)
Neutro Abs: 2.2 10*3/uL (ref 1.4–7.7)

## 2011-01-31 LAB — HEPATIC FUNCTION PANEL
ALT: 23 U/L (ref 0–53)
AST: 22 U/L (ref 0–37)
Albumin: 4.4 g/dL (ref 3.5–5.2)
Bilirubin, Direct: 0.1 mg/dL (ref 0.0–0.3)
Total Protein: 7.2 g/dL (ref 6.0–8.3)

## 2011-01-31 LAB — BASIC METABOLIC PANEL
Calcium: 9.2 mg/dL (ref 8.4–10.5)
Chloride: 102 mEq/L (ref 96–112)
Creatinine, Ser: 1 mg/dL (ref 0.4–1.5)
GFR: 104.15 mL/min (ref >60.00)

## 2011-01-31 LAB — LIPID PANEL
HDL: 40 mg/dL (ref >39.00)
LDL Cholesterol: 53 mg/dL (ref 0–99)
Total CHOL/HDL Ratio: 3

## 2011-01-31 LAB — TSH: TSH: 1.09 u[IU]/mL (ref 0.35–5.50)

## 2011-01-31 LAB — HEMOGLOBIN A1C: Hgb A1c MFr Bld: 7.7 % — ABNORMAL HIGH (ref 4.6–6.5)

## 2011-02-07 ENCOUNTER — Ambulatory Visit (INDEPENDENT_AMBULATORY_CARE_PROVIDER_SITE_OTHER): Payer: PRIVATE HEALTH INSURANCE | Admitting: Endocrinology

## 2011-02-07 ENCOUNTER — Encounter: Payer: Self-pay | Admitting: Endocrinology

## 2011-02-07 VITALS — BP 118/74 | HR 87 | Temp 98.2°F | Ht 66.0 in | Wt 172.8 lb

## 2011-02-07 DIAGNOSIS — E119 Type 2 diabetes mellitus without complications: Secondary | ICD-10-CM

## 2011-02-07 DIAGNOSIS — Z0000 Encounter for general adult medical examination without abnormal findings: Secondary | ICD-10-CM

## 2011-02-07 DIAGNOSIS — Z136 Encounter for screening for cardiovascular disorders: Secondary | ICD-10-CM

## 2011-02-07 DIAGNOSIS — Z23 Encounter for immunization: Secondary | ICD-10-CM

## 2011-02-07 MED ORDER — LIRAGLUTIDE 18 MG/3ML ~~LOC~~ SOLN: 1.8000 mg | SUBCUTANEOUS | Status: DC

## 2011-02-07 MED ORDER — GLIMEPIRIDE 1 MG PO TABS: 1.0000 mg | ORAL_TABLET | Freq: Every day | ORAL | Status: DC

## 2011-02-07 NOTE — Progress Notes (Signed)
Subjective:    Patient ID: Albert Reed, male    DOB: July 14, 1951, 60 y.o.   MRN: [id number]  HPI  Past Medical History  Diagnosis Date  . HYPERTHYROIDISM 11/09/2009  . DIVERTICULOSIS, COLON 04/01/2007  . COLONIC POLYPS, HX OF 04/01/2007  . HYPERTENSION 04/01/2007  . HYPERCHOLESTEROLEMIA 07/28/2008  . LEUKOPENIA, CHRONIC 07/28/2008  . DM 11/24/2008    No past surgical history on file.  History   Social History  . Marital Status: Married    Spouse Name: N/A    Number of Children: N/A  . Years of Education: N/A   Occupational History  . 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 Smoking status: Never Smoker   . Smokeless tobacco: Not on file  . Alcohol Use: Not on file  . Drug Use: Not on file  . Sexually Active: Not on file   Other Topics Concern  . Not on file   Social History Narrative   Originally from [place] (in [place] since 1996)    Current Outpatient Prescriptions on File Prior to Visit  Medication Sig Dispense Refill  . aspirin 81 MG tablet Take 81 mg by mouth daily.        . bromocriptine (PARLODEL) 2.5 MG tablet Take 2.5 mg by mouth 2 (two) times daily.        Marland Kitchen glucose blood (ONE TOUCH ULTRA TEST) test strip Check blood sugar once daily       . metFORMIN (GLUCOPHAGE-XR) 500 MG 24 hr tablet Take 2 tablets two times a day       . Multiple Vitamins-Minerals (CENTRUM SILVER ULTRA MENS) TABS Take 1 tablet by mouth daily.        . pioglitazone (ACTOS) 45 MG tablet Take 45 mg by mouth daily.        . pravastatin (PRAVACHOL) 40 MG tablet 2 by mouth at bedtime       . DISCONTD: glimepiride (AMARYL) 4 MG tablet 1 tablet by mouth every morning  30 tablet  3  . DISCONTD: sitaGLIPtan (JANUVIA) 100 MG tablet Take 100 mg by mouth daily.          No Known Allergies  Family History  Problem Relation Age of Onset  . Diabetes Brother     Type 2    BP 118/74  Pulse 87  Temp(Src) 98.2 F (36.8 C) (Oral)  Ht 5\' 6"  (1.676 m)  Wt 172 lb 12.8 oz  (78.382 kg)  BMI 27.89 kg/m2  SpO2 97%    Review of Systems  Constitutional: Negative for fever.  HENT: Negative for hearing loss.   Eyes: Negative for visual disturbance.  Respiratory: Negative for shortness of breath.   Cardiovascular: Negative for chest pain.  Gastrointestinal: Negative for anal bleeding.  Genitourinary: Negative for hematuria.  Musculoskeletal: Negative for arthralgias.  Skin: Negative for rash.  Neurological: Negative for syncope.  Hematological: Does not bruise/bleed easily.  Psychiatric/Behavioral: Negative for dysphoric mood.       Objective:   Physical Exam VS: see vs page GEN: no distress HEAD: head: no deformity eyes: no periorbital swelling, no proptosis external nose and ears are normal mouth: no lesion seen NECK: supple, thyroid is not enlarged CHEST WALL: no deformity BREASTS:  No gynecomastia. CV: reg rate and rhythm, no murmur ABD: abdomen is soft, nontender.  no hepatosplenomegaly.  not distended.  no hernia GENITALIA:  Normal male.   RECTAL: normal external and internal exam.  heme  neg. PROSTATE:  Normal size.  No nodule MUSCULOSKELETAL: muscle bulk and strength are grossly normal.  no obvious joint swelling.  gait is normal and steady PULSES: no carotid bruit NEURO:  cn 2-12 grossly intact.   readily moves all 4's.  SKIN:  Normal texture and temperature.  No rash or suspicious lesion is visible.   NODES:  None palpable at the neck PSYCH: alert, oriented x3.  Does not appear anxious nor depressed.     Assessment & Plan:   SEPARATE EVALUATION FOLLOWS--EACH PROBLEM HERE IS NEW, NOT RESPONDING TO TREATMENT, OR POSES SIGNIFICANT RISK TO THE PATIENT'S HEALTH: HISTORY OF THE PRESENT ILLNESS: no cbg record, but states cbg's are well-controlled.  Denies hypoglycemic sxs. PAST MEDICAL HISTORY reviewed and up to date today REVIEW OF SYSTEMS: Denies weight change PHYSICAL EXAMINATION: Pulses: dorsalis pedis intact bilat.   Feet: no  deformity.  no ulcer on the feet.  feet are of normal color and temp.  no edema Neuro: sensation is intact to touch on the feet LAB/XRAY RESULTS: Lab Results  Component Value Date   HGBA1C 7.7* 01/31/2011   IMPRESSION: Dm. he needs some adjustment in his therapy. The goal is to minimize the sulfonylurea. PLAN: See instruction page

## 2011-02-07 NOTE — Patient Instructions (Addendum)
Reduce glimepiride to 1 mg each morning Change januvia to "victoza."  The side-effect is nausea, which improved or goes away with time.  There are 3 settings on the "victoza" pen.  Try to increase from the minimum (0.6) to the maximum (1.8), as allowed by nausea.  please consider these measures for your health:  minimize alcohol.  do not use tobacco products.  have a colonoscopy at least every 10 years from age 60.  keep firearms safely stored.  always use seat belts.  have working smoke alarms in your home.  see an eye doctor and dentist regularly.  never drive under the influence of alcohol or drugs (including prescription drugs).   please let me know what your wishes would be, if artificial life support measures should become necessary.  it is critically important to prevent falling down (keep floor areas well-lit, dry, and free of loose objects) Please make a follow-up appointment in 3 months. (we discussed code status.  pt requests full code, but would not want to be started or maintained on artificial life-support measures if there was not a reasonable chance of recovery).

## 2011-02-09 DIAGNOSIS — Z0000 Encounter for general adult medical examination without abnormal findings: Secondary | ICD-10-CM | POA: Insufficient documentation

## 2011-03-26 ENCOUNTER — Other Ambulatory Visit: Payer: Self-pay | Admitting: Endocrinology

## 2011-05-14 ENCOUNTER — Other Ambulatory Visit: Payer: Self-pay | Admitting: Endocrinology

## 2011-05-15 ENCOUNTER — Other Ambulatory Visit: Payer: Self-pay | Admitting: Endocrinology

## 2011-06-13 ENCOUNTER — Ambulatory Visit (INDEPENDENT_AMBULATORY_CARE_PROVIDER_SITE_OTHER): Payer: BC Managed Care – PPO | Admitting: Endocrinology

## 2011-06-13 ENCOUNTER — Other Ambulatory Visit (INDEPENDENT_AMBULATORY_CARE_PROVIDER_SITE_OTHER): Payer: BC Managed Care – PPO

## 2011-06-13 ENCOUNTER — Encounter: Payer: Self-pay | Admitting: Endocrinology

## 2011-06-13 VITALS — BP 122/78 | HR 75 | Temp 97.0°F | Ht 66.0 in | Wt 169.0 lb

## 2011-06-13 DIAGNOSIS — E119 Type 2 diabetes mellitus without complications: Secondary | ICD-10-CM

## 2011-06-13 MED ORDER — GLIMEPIRIDE 4 MG PO TABS: 4.0000 mg | ORAL_TABLET | Freq: Every day | ORAL | Status: DC

## 2011-06-13 NOTE — Progress Notes (Signed)
  Subjective:    Patient ID: Albert Reed, male    DOB: 1951-02-03, 60 y.o.   MRN: [id number]  HPI no cbg record, but states cbg's vary from 100-160 in am, which is the only time of day he checks.   pt states he feels well in general. Past Medical History  Diagnosis Date  . HYPERTHYROIDISM 11/09/2009  . DIVERTICULOSIS, COLON 04/01/2007  . COLONIC POLYPS, HX OF 04/01/2007  . HYPERTENSION 04/01/2007  . HYPERCHOLESTEROLEMIA 07/28/2008  . LEUKOPENIA, CHRONIC 07/28/2008  . DM 11/24/2008    No past surgical history on file.  History   Social History  . Marital Status: Married    Spouse Name: N/A    Number of Children: N/A  . Years of Education: N/A   Occupational History  . 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 Smoking status: Never Smoker   . Smokeless tobacco: Not on file  . Alcohol Use: Not on file  . Drug Use: Not on file  . Sexually Active: Not on file   Other Topics Concern  . Not on file   Social History Narrative   Originally from [place] (in [place] since 1996)    Current Outpatient Prescriptions on File Prior to Visit  Medication Sig Dispense Refill  . ACTOS 45 MG tablet TAKE ONE TABLET BY MOUTH EVERY DAY  30 each  8  . aspirin 81 MG tablet Take 81 mg by mouth daily.        . bromocriptine (PARLODEL) 2.5 MG tablet Take 2.5 mg by mouth 2 (two) times daily.        Marland Kitchen glucose blood (ONE TOUCH ULTRA TEST) test strip Check blood sugar once daily       . Liraglutide (VICTOZA) 18 MG/3ML SOLN Inject 0.3 mLs (1.8 mg total) into the skin every morning. And pen needles, 1 daily  6 mL  11  . metFORMIN (GLUCOPHAGE-XR) 500 MG 24 hr tablet TAKE TWO TABLETS BY MOUTH TWICE DAILY  120 tablet  8  . Multiple Vitamins-Minerals (CENTRUM SILVER ULTRA MENS) TABS Take 1 tablet by mouth daily.        . pravastatin (PRAVACHOL) 40 MG tablet TAKE TWO TABLETS BY MOUTH AT BEDTIME  60 tablet  5    No Known Allergies  Family History  Problem Relation Age of Onset  .  Diabetes Brother     Type 2    BP 122/78  Pulse 75  Temp(Src) 97 F (36.1 C) (Oral)  Ht 5\' 6"  (1.676 m)  Wt 169 lb (76.658 kg)  BMI 27.28 kg/m2  SpO2 98%   Review of Systems denies hypoglycemia.      Objective:   Physical Exam VITAL SIGNS:  See vs page GENERAL: no distress SKIN:  Insulin injection sites at the anterior abdomen are normal.   Lab Results  Component Value Date   HGBA1C 8.6* 06/13/2011      Assessment & Plan:  Dm, needs increased rx

## 2011-06-13 NOTE — Patient Instructions (Addendum)
blood tests are being requested for you today.  please call [phone] to hear your test results.  You will be prompted to enter the 9-digit "MRN" number that appears at the top left of this page, followed by #.  Then you will hear the message. Based on the results, i may advise you to add "welchol" (a cholesterol medication, which also helps the blood sugar).  Here is a discount card for this.   Please come back for a follow-up appointment in 3 months.   (update: i left message on phone-tree:  Hold-off on welchol for now.  Increase amaryl to 4 mg qam).

## 2011-07-12 ENCOUNTER — Other Ambulatory Visit: Payer: Self-pay | Admitting: Endocrinology

## 2011-10-16 ENCOUNTER — Other Ambulatory Visit: Payer: Self-pay | Admitting: Endocrinology

## 2011-10-23 ENCOUNTER — Other Ambulatory Visit (INDEPENDENT_AMBULATORY_CARE_PROVIDER_SITE_OTHER): Payer: BC Managed Care – PPO

## 2011-10-23 ENCOUNTER — Ambulatory Visit (INDEPENDENT_AMBULATORY_CARE_PROVIDER_SITE_OTHER): Payer: BC Managed Care – PPO | Admitting: Endocrinology

## 2011-10-23 ENCOUNTER — Encounter: Payer: Self-pay | Admitting: Endocrinology

## 2011-10-23 VITALS — BP 130/78 | HR 82 | Temp 98.5°F | Ht 66.0 in | Wt 176.1 lb

## 2011-10-23 DIAGNOSIS — E119 Type 2 diabetes mellitus without complications: Secondary | ICD-10-CM

## 2011-10-23 LAB — HEMOGLOBIN A1C: Hgb A1c MFr Bld: 8.1 % — ABNORMAL HIGH (ref 4.6–6.5)

## 2011-10-23 MED ORDER — COLESEVELAM HCL 625 MG PO TABS: 1875.0000 mg | ORAL_TABLET | Freq: Two times a day (BID) | ORAL | Status: DC

## 2011-10-23 MED ORDER — LIRAGLUTIDE 18 MG/3ML ~~LOC~~ SOLN: 1.8000 mg | SUBCUTANEOUS | Status: DC

## 2011-10-23 MED ORDER — METFORMIN HCL ER 500 MG PO TB24: 1000.0000 mg | ORAL_TABLET | Freq: Two times a day (BID) | ORAL | Status: DC

## 2011-10-23 MED ORDER — DOXYCYCLINE HYCLATE 100 MG PO TABS: 100.0000 mg | ORAL_TABLET | Freq: Two times a day (BID) | ORAL | Status: DC

## 2011-10-23 MED ORDER — BROMOCRIPTINE MESYLATE 2.5 MG PO TABS: 2.5000 mg | ORAL_TABLET | Freq: Two times a day (BID) | ORAL | Status: DC

## 2011-10-23 NOTE — Patient Instructions (Addendum)
blood tests are being requested for you today.  please call [phone] to hear your test results.  You will be prompted to enter the 9-digit "MRN" number that appears at the top left of this page, followed by #.  Then you will hear the message. Based on the results, i may advise you to add "welchol" (a cholesterol medication, which also helps the blood sugar).  Here is a discount card for this.   Please come back for a regular physical appointment in 4 months.   i have sent a prescription to your pharmacy, for an antibiotic.  You should take this daily.  (update: i left message on phone-tree:  i sent rx for welchol).

## 2011-10-23 NOTE — Progress Notes (Signed)
  Subjective:    Patient ID: Albert Reed, male    DOB: Feb 07, 1951, 61 y.o.   MRN: [id number]  HPI Pt returns for f/u of type 2 DM (1997).  pt states he feels well in general.  no cbg record, but states cbg's vary from 60-180. There is no trend throughout the day. Pt states few years of intermittent slight pustule at the right buttock, and assoc drainage.   Past Medical History  Diagnosis Date  . HYPERTHYROIDISM 11/09/2009  . DIVERTICULOSIS, COLON 04/01/2007  . COLONIC POLYPS, HX OF 04/01/2007  . HYPERTENSION 04/01/2007  . HYPERCHOLESTEROLEMIA 07/28/2008  . LEUKOPENIA, CHRONIC 07/28/2008  . DM 11/24/2008    No past surgical history on file.  History   Social History  . Marital Status: Married    Spouse Name: N/A    Number of Children: N/A  . Years of Education: N/A   Occupational History  . 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 Smoking status: Never Smoker   . Smokeless tobacco: Not on file  . Alcohol Use: Not on file  . Drug Use: Not on file  . Sexually Active: Not on file   Other Topics Concern  . Not on file   Social History Narrative   Originally from [place] (in [place] since 1996)    Current Outpatient Prescriptions on File Prior to Visit  Medication Sig Dispense Refill  . ACTOS 45 MG tablet TAKE ONE TABLET BY MOUTH EVERY DAY  30 each  8  . aspirin 81 MG tablet Take 81 mg by mouth daily.        Marland Kitchen glimepiride (AMARYL) 4 MG tablet Take 1 tablet (4 mg total) by mouth daily before breakfast.  30 tablet  11  . glucose blood (ONE TOUCH ULTRA TEST) test strip Check blood sugar once daily       . Multiple Vitamins-Minerals (CENTRUM SILVER ULTRA MENS) TABS Take 1 tablet by mouth daily.        . pravastatin (PRAVACHOL) 40 MG tablet TAKE TWO TABLETS BY MOUTH AT BEDTIME  60 tablet  5    No Known Allergies  Family History  Problem Relation Age of Onset  . Diabetes Brother     Type 2    BP 130/78  Pulse 82  Temp(Src) 98.5 F (36.9 C)  (Oral)  Ht 5\' 6"  (1.676 m)  Wt 176 lb 1.9 oz (79.888 kg)  BMI 28.43 kg/m2  SpO2 98%   Review of Systems denies hypoglycemia.      Objective:   Physical Exam VITAL SIGNS:  See vs page GENERAL: no distress Pulses: dorsalis pedis intact bilat.   Feet: no deformity.  no ulcer on the feet.  feet are of normal color and temp.  no edema Neuro: sensation is intact to touch on the feet. Right buttock: there is a healed pustule, but no drainage/tend now  Lab Results  Component Value Date   HGBA1C 8.1* 10/23/2011      Assessment & Plan:  DM, needs increased rx Pustule, new

## 2011-11-05 ENCOUNTER — Other Ambulatory Visit: Payer: Self-pay | Admitting: Endocrinology

## 2011-11-29 ENCOUNTER — Other Ambulatory Visit: Payer: Self-pay | Admitting: Endocrinology

## 2012-02-13 ENCOUNTER — Ambulatory Visit (INDEPENDENT_AMBULATORY_CARE_PROVIDER_SITE_OTHER): Payer: BC Managed Care – PPO | Admitting: Endocrinology

## 2012-02-13 ENCOUNTER — Encounter: Payer: Self-pay | Admitting: Endocrinology

## 2012-02-13 VITALS — BP 144/82 | HR 86 | Temp 98.4°F | Ht 66.0 in | Wt 173.0 lb

## 2012-02-13 DIAGNOSIS — E119 Type 2 diabetes mellitus without complications: Secondary | ICD-10-CM

## 2012-02-13 MED ORDER — LOSARTAN POTASSIUM 50 MG PO TABS: 50.0000 mg | ORAL_TABLET | Freq: Every day | ORAL | Status: DC

## 2012-02-13 MED ORDER — CEFUROXIME AXETIL 250 MG PO TABS: 250.0000 mg | ORAL_TABLET | Freq: Two times a day (BID) | ORAL | Status: AC

## 2012-02-13 NOTE — Progress Notes (Signed)
  Subjective:    Patient ID: Albert Reed, male    DOB: Dec 08, 1950, 61 y.o.   MRN: [id number]  HPI Pt states 1 month of moderate pain at the right ear, but no assoc drainage. He wants to go back to insulin, to save copays.  Past Medical History  Diagnosis Date  . HYPERTHYROIDISM 11/09/2009  . DIVERTICULOSIS, COLON 04/01/2007  . COLONIC POLYPS, HX OF 04/01/2007  . HYPERTENSION 04/01/2007  . HYPERCHOLESTEROLEMIA 07/28/2008  . LEUKOPENIA, CHRONIC 07/28/2008  . DM 11/24/2008    No past surgical history on file.  History   Social History  . Marital Status: Married    Spouse Name: N/A    Number of Children: N/A  . Years of Education: N/A   Occupational History  . 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 Smoking status: Never Smoker   . Smokeless tobacco: Not on file  . Alcohol Use: Not on file  . Drug Use: Not on file  . Sexually Active: Not on file   Other Topics Concern  . Not on file   Social History Narrative   Originally from [place] (in [place] since 1996)    Current Outpatient Prescriptions on File Prior to Visit  Medication Sig Dispense Refill  . aspirin 81 MG tablet Take 81 mg by mouth daily.        . Multiple Vitamins-Minerals (CENTRUM SILVER ULTRA MENS) TABS Take 1 tablet by mouth daily.        . ONE TOUCH ULTRA TEST test strip USE ONE TEST STRIP TO CHECK BLOOD SUGAR EVERY DAY  100 each  3  . pravastatin (PRAVACHOL) 40 MG tablet TAKE TWO TABLETS BY MOUTH EVERY DAY AT BEDTIME  60 tablet  5  . insulin lispro protamine-insulin lispro (HUMALOG 75/25) (75-25) 100 UNIT/ML SUSP Inject 15 Units into the skin daily with breakfast.  10 mL  2  . losartan (COZAAR) 50 MG tablet Take 1 tablet (50 mg total) by mouth daily.  30 tablet  11    No Known Allergies  Family History  Problem Relation Age of Onset  . Diabetes Brother     Type 2    BP 144/82  Pulse 86  Temp 98.4 F (36.9 C) (Oral)  Ht 5\' 6"  (1.676 m)  Wt 173 lb (78.472 kg)  BMI 27.92  kg/m2  SpO2 99%  Review of Systems He reports 1 week of excessive diaphoresis.  denies hypoglycemia    Objective:   Physical Exam VITAL SIGNS:  See vs page GENERAL: no distress Right eac is normal, but the tm is slightly red      Assessment & Plan:  DM.  He wants to resume insulin, and i agree Aom, new

## 2012-02-13 NOTE — Patient Instructions (Addendum)
i have sent a prescription to your pharmacy, for an antibiotic pill.  I hope you feel better soon.  If you don't feel better by next week, please call back, and i would refer you to an ear-nose-throat specialist.   Please come back for a regular physical appointment in 1 month.   i have also sent a prescription to your pharmacy, for a blood pressure pill. Stop victoza, glimepiride, bromocriptine, actos, welchol, and metformin. Start insulin, 15 units each day, with breakfast.   check your blood sugar 2 times a day.  vary the time of day when you check, between before the 3 meals, and at bedtime.  also check if you have symptoms of your blood sugar being too high or too low.  please keep a record of the readings and bring it to your next appointment here.  please call us sooner if your blood sugar goes below 70, or if it stays over 200.

## 2012-02-18 ENCOUNTER — Other Ambulatory Visit: Payer: Self-pay

## 2012-02-18 MED ORDER — "INSULIN SYRINGE-NEEDLE U-100 31G X 5/16"" 0.5 ML MISC": 1.0000 | Freq: Every day | Status: DC

## 2012-02-18 MED ORDER — INSULIN LISPRO PROT & LISPRO (75-25 MIX) 100 UNIT/ML ~~LOC~~ SUSP: 15.0000 [IU] | Freq: Every day | SUBCUTANEOUS | Status: DC

## 2012-02-26 ENCOUNTER — Telehealth: Payer: Self-pay | Admitting: *Deleted

## 2012-02-26 NOTE — Telephone Encounter (Signed)
Increase am insulin to 40

## 2012-02-26 NOTE — Telephone Encounter (Signed)
Pt states that at 02/13/2012 OV pt was changed from oral DM meds and Victoza to insulin. Pt states that his CBG's have been over 200 since switching to insulin. Pt increased insulin dosage and is now taking 25 units qAM and PM. Please advise.

## 2012-02-27 MED ORDER — INSULIN NPH ISOPHANE & REGULAR (70-30) 100 UNIT/ML ~~LOC~~ SUSP: SUBCUTANEOUS | Status: DC

## 2012-02-27 MED ORDER — INSULIN LISPRO PROT & LISPRO (75-25 MIX) 100 UNIT/ML ~~LOC~~ SUSP: SUBCUTANEOUS | Status: DC

## 2012-02-27 NOTE — Telephone Encounter (Signed)
Pt informed of MD's advisement. Pt states that Humalog rx is over $100 dollars for 1 month supply and that he cannot afford rx (he is still using samples). Pt wants to know if another rx for insulin can be sent in that is cheaper (previously used Novolin).

## 2012-02-27 NOTE — Telephone Encounter (Signed)
i changed and sent rx 

## 2012-02-27 NOTE — Telephone Encounter (Signed)
Pt informed of new insulin rx.  

## 2012-04-23 ENCOUNTER — Other Ambulatory Visit (INDEPENDENT_AMBULATORY_CARE_PROVIDER_SITE_OTHER): Payer: BC Managed Care – PPO

## 2012-04-23 ENCOUNTER — Encounter: Payer: Self-pay | Admitting: Endocrinology

## 2012-04-23 ENCOUNTER — Ambulatory Visit (INDEPENDENT_AMBULATORY_CARE_PROVIDER_SITE_OTHER): Payer: BC Managed Care – PPO | Admitting: Endocrinology

## 2012-04-23 VITALS — BP 102/64 | HR 91 | Temp 98.1°F | Ht 66.0 in | Wt 173.0 lb

## 2012-04-23 DIAGNOSIS — Z23 Encounter for immunization: Secondary | ICD-10-CM

## 2012-04-23 DIAGNOSIS — E0590 Thyrotoxicosis, unspecified without thyrotoxic crisis or storm: Secondary | ICD-10-CM

## 2012-04-23 DIAGNOSIS — D72819 Decreased white blood cell count, unspecified: Secondary | ICD-10-CM

## 2012-04-23 DIAGNOSIS — Z79899 Other long term (current) drug therapy: Secondary | ICD-10-CM

## 2012-04-23 DIAGNOSIS — E119 Type 2 diabetes mellitus without complications: Secondary | ICD-10-CM

## 2012-04-23 DIAGNOSIS — Z125 Encounter for screening for malignant neoplasm of prostate: Secondary | ICD-10-CM

## 2012-04-23 DIAGNOSIS — E7800 Pure hypercholesterolemia, unspecified: Secondary | ICD-10-CM

## 2012-04-23 DIAGNOSIS — Z0000 Encounter for general adult medical examination without abnormal findings: Secondary | ICD-10-CM

## 2012-04-23 DIAGNOSIS — I10 Essential (primary) hypertension: Secondary | ICD-10-CM

## 2012-04-23 LAB — BASIC METABOLIC PANEL
CO2: 29 mEq/L (ref 19–32)
Chloride: 100 mEq/L (ref 96–112)
Creatinine, Ser: 1 mg/dL (ref 0.4–1.5)
Glucose, Bld: 306 mg/dL — ABNORMAL HIGH (ref 70–99)

## 2012-04-23 LAB — URINALYSIS, ROUTINE W REFLEX MICROSCOPIC
Bilirubin Urine: NEGATIVE
Ketones, ur: NEGATIVE
Leukocytes, UA: NEGATIVE
Specific Gravity, Urine: 1.01 (ref 1.000–1.030)
Total Protein, Urine: NEGATIVE
Urine Glucose: >=1000
pH: 7 (ref 5.0–8.0)

## 2012-04-23 LAB — CBC WITH DIFFERENTIAL/PLATELET
Basophils Relative: 0.7 % (ref 0.0–3.0)
Eosinophils Relative: 1.8 % (ref 0.0–5.0)
HCT: 38.4 % — ABNORMAL LOW (ref 39.0–52.0)
Hemoglobin: 12.6 g/dL — ABNORMAL LOW (ref 13.0–17.0)
Lymphocytes Relative: 32.6 % (ref 12.0–46.0)
Lymphs Abs: 1.2 10*3/uL (ref 0.7–4.0)
Monocytes Relative: 8.6 % (ref 3.0–12.0)
Neutro Abs: 2 10*3/uL (ref 1.4–7.7)
RBC: 4.22 Mil/uL (ref 4.22–5.81)
RDW: 14.7 % — ABNORMAL HIGH (ref 11.5–14.6)

## 2012-04-23 LAB — LIPID PANEL
Cholesterol: 109 mg/dL (ref 0–200)
Total CHOL/HDL Ratio: 3
Triglycerides: 288 mg/dL — ABNORMAL HIGH (ref 0.0–149.0)
VLDL: 57.6 mg/dL — ABNORMAL HIGH (ref 0.0–40.0)

## 2012-04-23 LAB — HEPATIC FUNCTION PANEL
AST: 22 U/L (ref 0–37)
Albumin: 3.6 g/dL (ref 3.5–5.2)

## 2012-04-23 LAB — TSH: TSH: 1.23 u[IU]/mL (ref 0.35–5.50)

## 2012-04-23 LAB — LDL CHOLESTEROL, DIRECT: Direct LDL: 49.3 mg/dL

## 2012-04-23 LAB — PSA: PSA: 0.24 ng/mL (ref 0.10–4.00)

## 2012-04-23 LAB — MICROALBUMIN / CREATININE URINE RATIO: Microalb Creat Ratio: 0.4 mg/g (ref 0.0–30.0)

## 2012-04-23 NOTE — Progress Notes (Signed)
Subjective:    Patient ID: Albert Reed, male    DOB: 12-25-1950, 61 y.o.   MRN: [id number]  HPI here for regular wellness examination.  He's feeling pretty well in general, and says chronic med probs are stable, except as noted below Past Medical History  Diagnosis Date  . HYPERTHYROIDISM 11/09/2009  . DIVERTICULOSIS, COLON 04/01/2007  . COLONIC POLYPS, HX OF 04/01/2007  . HYPERTENSION 04/01/2007  . HYPERCHOLESTEROLEMIA 07/28/2008  . LEUKOPENIA, CHRONIC 07/28/2008  . DM 11/24/2008    No past surgical history on file.  History   Social History  . Marital Status: Married    Spouse Name: N/A    Number of Children: N/A  . Years of Education: N/A   Occupational History  . 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 Smoking status: Never Smoker   . Smokeless tobacco: Not on file  . Alcohol Use: Not on file  . Drug Use: Not on file  . Sexually Active: Not on file   Other Topics Concern  . Not on file   Social History Narrative   Originally from [place] (in [place] since 1996)    Current Outpatient Prescriptions on File Prior to Visit  Medication Sig Dispense Refill  . aspirin 81 MG tablet Take 81 mg by mouth daily.        Marland Kitchen doxycycline (VIBRA-TABS) 100 MG tablet Take 100 mg by mouth 2 (two) times daily.       . insulin NPH-insulin regular (NOVOLIN 70/30) (70-30) 100 UNIT/ML injection 25 units with breakfast, and 15 units with the evening meal      . Insulin Syringe-Needle U-100 (BD INSULIN SYRINGE ULTRAFINE) 31G X 5/16" 0.5 ML MISC 1 each by Does not apply route daily.  100 each  1  . losartan (COZAAR) 50 MG tablet Take 1 tablet (50 mg total) by mouth daily.  30 tablet  11  . Multiple Vitamins-Minerals (CENTRUM SILVER ULTRA MENS) TABS Take 1 tablet by mouth daily.        . ONE TOUCH ULTRA TEST test strip USE ONE TEST STRIP TO CHECK BLOOD SUGAR EVERY DAY  100 each  3  . pravastatin (PRAVACHOL) 40 MG tablet TAKE TWO TABLETS BY MOUTH EVERY DAY AT BEDTIME   60 tablet  5    No Known Allergies  Family History  Problem Relation Age of Onset  . Diabetes Brother     Type 2    BP 102/64  Pulse 91  Temp 98.1 F (36.7 C) (Oral)  Ht 5\' 6"  (1.676 m)  Wt 173 lb (78.472 kg)  BMI 27.92 kg/m2  SpO2 97%  Review of Systems  Constitutional: Negative for fever and unexpected weight change.  HENT: Negative for hearing loss.   Eyes: Negative for visual disturbance.  Respiratory: Negative for shortness of breath.   Cardiovascular: Negative for chest pain.  Gastrointestinal: Negative for anal bleeding.  Genitourinary: Negative for hematuria and difficulty urinating.  Musculoskeletal: Negative for back pain.  Skin: Negative for rash.  Neurological: Negative for syncope.  Hematological: Bruises/bleeds easily.  Psychiatric/Behavioral: Negative for dysphoric mood.       Objective:   Physical Exam VS: see vs page GEN: no distress HEAD: head: no deformity eyes: no periorbital swelling, no proptosis external nose and ears are normal mouth: no lesion seen NECK: supple, thyroid is not enlarged CHEST WALL: no deformity LUNGS: clear to auscultation BREASTS:  No gynecomastia CV: reg rate and rhythm,  no murmur ABD: abdomen is soft, nontender.  no hepatosplenomegaly.  not distended.  no hernia RECTAL: normal external and internal exam.  heme neg. PROSTATE:  Normal size.  No nodule MUSCULOSKELETAL: muscle bulk and strength are grossly normal.  no obvious joint swelling.  gait is normal and steady PULSES: no carotid bruit NEURO:  cn 2-12 grossly intact.   readily moves all 4's.   SKIN:  Normal texture and temperature.  No rash or suspicious lesion is visible.   NODES:  None palpable at the neck PSYCH: alert, oriented x3.  Does not appear anxious nor depressed.     Assessment & Plan:  Wellness visit today, with problems stable, except as noted.    SEPARATE EVALUATION FOLLOWS--EACH PROBLEM HERE IS NEW, NOT RESPONDING TO TREATMENT, OR POSES  SIGNIFICANT RISK TO THE PATIENT'S HEALTH: HISTORY OF THE PRESENT ILLNESS: Pt says he takes only 35 am and 15 pm, due to hypoglycemia.   Also, he had to reverse am and pm insulins during Ramadan, which ended 1 month ago.   PAST MEDICAL HISTORY reviewed and up to date today REVIEW OF SYSTEMS: PHYSICAL EXAMINATION: VITAL SIGNS:  See vs page GENERAL: no distress Pulses: dorsalis pedis intact bilat.   Feet: no deformity.  no ulcer on the feet.  feet are of normal color and temp.  no edema Neuro: sensation is intact to touch on the feet  LAB/XRAY RESULTS: Lab Results  Component Value Date   HGBA1C 9.4* 04/23/2012  IMPRESSION: DM: needs increased rx PLAN: See instruction page

## 2012-04-23 NOTE — Patient Instructions (Addendum)
please consider these measures for your health:  minimize alcohol.  do not use tobacco products.  have a colonoscopy at least every 10 years from age 61.  keep firearms safely stored.  always use seat belts.  have working smoke alarms in your home.  see an eye doctor and dentist regularly.  never drive under the influence of alcohol or drugs (including prescription drugs).   Reduce the insulin to 35 with breakfast, and 15 with the evening meal.   Please come back for a follow-up appointment in 3 months.

## 2012-04-24 ENCOUNTER — Telehealth: Payer: Self-pay | Admitting: *Deleted

## 2012-04-24 NOTE — Telephone Encounter (Signed)
Called pt to inform of lab results, pt informed (letter also mailed to pt). 

## 2012-06-12 ENCOUNTER — Other Ambulatory Visit: Payer: Self-pay | Admitting: Endocrinology

## 2012-11-04 ENCOUNTER — Encounter: Payer: Self-pay | Admitting: Endocrinology

## 2012-11-04 ENCOUNTER — Ambulatory Visit (INDEPENDENT_AMBULATORY_CARE_PROVIDER_SITE_OTHER): Payer: BC Managed Care – PPO | Admitting: Endocrinology

## 2012-11-04 VITALS — BP 126/80 | HR 65 | Wt 176.0 lb

## 2012-11-04 DIAGNOSIS — E119 Type 2 diabetes mellitus without complications: Secondary | ICD-10-CM

## 2012-11-04 NOTE — Progress Notes (Signed)
  Subjective:    Patient ID: Albert Reed, male    DOB: 08-Mar-1951, 62 y.o.   MRN: [id number]  HPI Pt returns for f/u of type 2 DM (dx'ed 1997; no known complications; he has never had severe hypoglycemia or DKA).  pt states he feels well in general.  no cbg record, but states cbg's vary from 100-200. There is no trend throughout the day.   Past Medical History  Diagnosis Date  . HYPERTHYROIDISM 11/09/2009  . DIVERTICULOSIS, COLON 04/01/2007  . COLONIC POLYPS, HX OF 04/01/2007  . HYPERTENSION 04/01/2007  . HYPERCHOLESTEROLEMIA 07/28/2008  . LEUKOPENIA, CHRONIC 07/28/2008  . DM 11/24/2008    No past surgical history on file.  History   Social History  . Marital Status: Married    Spouse Name: N/A    Number of Children: N/A  . Years of Education: N/A   Occupational History  . 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 Smoking status: Never Smoker   . Smokeless tobacco: Not on file  . Alcohol Use: Not on file  . Drug Use: Not on file  . Sexually Active: Not on file   Other Topics Concern  . Not on file   Social History Narrative   Originally from [place] (in [place] since 1996)    Current Outpatient Prescriptions on File Prior to Visit  Medication Sig Dispense Refill  . aspirin 81 MG tablet Take 81 mg by mouth daily.        Marland Kitchen doxycycline (VIBRA-TABS) 100 MG tablet Take 100 mg by mouth 2 (two) times daily.       . insulin NPH-insulin regular (NOVOLIN 70/30) (70-30) 100 UNIT/ML injection 40 units with breakfast, and 20 units with the evening meal      . Insulin Syringe-Needle U-100 (BD INSULIN SYRINGE ULTRAFINE) 31G X 5/16" 0.5 ML MISC 1 each by Does not apply route daily.  100 each  1  . losartan (COZAAR) 50 MG tablet Take 1 tablet (50 mg total) by mouth daily.  30 tablet  11  . Multiple Vitamins-Minerals (CENTRUM SILVER ULTRA MENS) TABS Take 1 tablet by mouth daily.        . ONE TOUCH ULTRA TEST test strip USE ONE TEST STRIP TO CHECK BLOOD SUGAR EVERY  DAY  100 each  3  . pravastatin (PRAVACHOL) 40 MG tablet TAKE TWO TABLETS BY MOUTH EVERY DAY AT BEDTIME  60 tablet  10   No current facility-administered medications on file prior to visit.    No Known Allergies  Family History  Problem Relation Age of Onset  . Diabetes Brother     Type 2    BP 126/80  Pulse 65  Wt 176 lb (79.833 kg)  BMI 28.42 kg/m2  SpO2 98%   Review of Systems denies hypoglycemia    Objective:   Physical Exam Pulses: dorsalis pedis intact bilat.   Feet: no deformity.  no ulcer on the feet.  feet are of normal color and temp.  no edema Neuro: sensation is intact to touch on the feet    Lab Results  Component Value Date   HGBA1C 9.4* 11/04/2012      Assessment & Plan:  DM: needs increased rx

## 2012-11-04 NOTE — Patient Instructions (Addendum)
Please continue the same insulin.   Please come back for a follow-up appointment in 3 months. check your blood sugar twice a day.  vary the time of day when you check, between before the 3 meals, and at bedtime.  also check if you have symptoms of your blood sugar being too high or too low.  please keep a record of the readings and bring it to your next appointment here.  please call us sooner if your blood sugar goes below 70, or if you have a lot of readings over 200. A diabetes blood test is being requested for you today.  We'll contact you with results.  Please come back for a regular physical appointment in 6 months.

## 2012-12-22 ENCOUNTER — Other Ambulatory Visit: Payer: Self-pay | Admitting: Endocrinology

## 2013-03-11 ENCOUNTER — Other Ambulatory Visit: Payer: Self-pay | Admitting: *Deleted

## 2013-03-11 MED ORDER — INSULIN NPH ISOPHANE & REGULAR (70-30) 100 UNIT/ML ~~LOC~~ SUSP: SUBCUTANEOUS | Status: DC

## 2013-04-21 ENCOUNTER — Ambulatory Visit (INDEPENDENT_AMBULATORY_CARE_PROVIDER_SITE_OTHER): Payer: BC Managed Care – PPO | Admitting: Endocrinology

## 2013-04-21 ENCOUNTER — Encounter: Payer: Self-pay | Admitting: Endocrinology

## 2013-04-21 VITALS — BP 134/76 | HR 82 | Ht 65.0 in | Wt 178.0 lb

## 2013-04-21 DIAGNOSIS — Z79899 Other long term (current) drug therapy: Secondary | ICD-10-CM

## 2013-04-21 DIAGNOSIS — E7800 Pure hypercholesterolemia, unspecified: Secondary | ICD-10-CM

## 2013-04-21 DIAGNOSIS — I10 Essential (primary) hypertension: Secondary | ICD-10-CM

## 2013-04-21 DIAGNOSIS — Z0000 Encounter for general adult medical examination without abnormal findings: Secondary | ICD-10-CM

## 2013-04-21 DIAGNOSIS — Z125 Encounter for screening for malignant neoplasm of prostate: Secondary | ICD-10-CM

## 2013-04-21 DIAGNOSIS — E119 Type 2 diabetes mellitus without complications: Secondary | ICD-10-CM

## 2013-04-21 DIAGNOSIS — E0590 Thyrotoxicosis, unspecified without thyrotoxic crisis or storm: Secondary | ICD-10-CM

## 2013-04-21 LAB — CBC WITH DIFFERENTIAL/PLATELET
Basophils Relative: 0.5 % (ref 0.0–3.0)
Eosinophils Relative: 1 % (ref 0.0–5.0)
Lymphocytes Relative: 34.2 % (ref 12.0–46.0)
Monocytes Relative: 9.3 % (ref 3.0–12.0)
Neutrophils Relative %: 55 % (ref 43.0–77.0)
RBC: 4.39 Mil/uL (ref 4.22–5.81)
WBC: 3.6 10*3/uL — ABNORMAL LOW (ref 4.5–10.5)

## 2013-04-21 LAB — HEPATIC FUNCTION PANEL
ALT: 26 U/L (ref 0–53)
Albumin: 3.9 g/dL (ref 3.5–5.2)
Total Bilirubin: 0.6 mg/dL (ref 0.3–1.2)
Total Protein: 6.4 g/dL (ref 6.0–8.3)

## 2013-04-21 LAB — BASIC METABOLIC PANEL
CO2: 31 mEq/L (ref 19–32)
Calcium: 8.8 mg/dL (ref 8.4–10.5)
Chloride: 97 mEq/L (ref 96–112)
Sodium: 134 mEq/L — ABNORMAL LOW (ref 135–145)

## 2013-04-21 LAB — MICROALBUMIN / CREATININE URINE RATIO
Creatinine,U: 34.3 mg/dL
Microalb Creat Ratio: 1.5 mg/g (ref 0.0–30.0)

## 2013-04-21 LAB — URINALYSIS, ROUTINE W REFLEX MICROSCOPIC
Nitrite: NEGATIVE
RBC / HPF: NONE SEEN (ref 0–?)
Specific Gravity, Urine: 1.01 (ref 1.000–1.030)
Total Protein, Urine: NEGATIVE
Urine Glucose: >=1000
WBC, UA: NONE SEEN (ref 0–?)

## 2013-04-21 LAB — TSH: TSH: 1.13 u[IU]/mL (ref 0.35–5.50)

## 2013-04-21 LAB — LIPID PANEL
Cholesterol: 100 mg/dL (ref 0–200)
Total CHOL/HDL Ratio: 3
Triglycerides: 101 mg/dL (ref 0.0–149.0)

## 2013-04-21 LAB — HEMOGLOBIN A1C: Hgb A1c MFr Bld: 8.1 % — ABNORMAL HIGH (ref 4.6–6.5)

## 2013-04-21 MED ORDER — INSULIN NPH ISOPHANE & REGULAR (70-30) 100 UNIT/ML ~~LOC~~ SUSP: SUBCUTANEOUS | Status: DC

## 2013-04-21 NOTE — Patient Instructions (Addendum)
blood tests are being requested for you today.  We'll contact you with results. Please take this amount of insulin, no matter what your blood sugar is. check your blood sugar twice a day.  vary the time of day when you check, between before the 3 meals, and at bedtime.  also check if you have symptoms of your blood sugar being too high or too low.  please keep a record of the readings and bring it to your next appointment here.  please call us sooner if your blood sugar goes below 70, or if you have a lot of readings over 200. Refer for a colonoscopy.  you will receive a phone call, about a day and time for an appointment Please come back for a regular physical appointment soon.

## 2013-04-21 NOTE — Progress Notes (Signed)
Subjective:    Patient ID: Albert Reed, male    DOB: 07/05/1951, 62 y.o.   MRN: [id number]  HPI Pt returns for f/u of type 2 DM (dx'ed 1997; he has mild if any neuropathy of the lower extremities.no known associated complications; he has never had severe hypoglycemia or DKA; he has chosen a simple, inexpensive insulin regimen).  pt states he feels well in general.  no cbg record, but states cbg's vary from 45-150. There is no trend throughout the day.  He takes a varying dosage of insulin, according to what his cbg is at that time.   Past Medical History  Diagnosis Date  . HYPERTHYROIDISM 11/09/2009  . DIVERTICULOSIS, COLON 04/01/2007  . COLONIC POLYPS, HX OF 04/01/2007  . HYPERTENSION 04/01/2007  . HYPERCHOLESTEROLEMIA 07/28/2008  . LEUKOPENIA, CHRONIC 07/28/2008  . DM 11/24/2008    No past surgical history on file.  History   Social History  . Marital Status: Married    Spouse Name: N/A    Number of Children: N/A  . Years of Education: N/A   Occupational History  . 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 Smoking status: Never Smoker   . Smokeless tobacco: Not on file  . Alcohol Use: Not on file  . Drug Use: Not on file  . Sexual Activity: Not on file   Other Topics Concern  . Not on file   Social History Narrative   Originally from [place] (in [place] since 1996)    Current Outpatient Prescriptions on File Prior to Visit  Medication Sig Dispense Refill  . aspirin 81 MG tablet Take 81 mg by mouth daily.        Marland Kitchen doxycycline (VIBRA-TABS) 100 MG tablet Take 100 mg by mouth 2 (two) times daily.       . Insulin Syringe-Needle U-100 (BD INSULIN SYRINGE ULTRAFINE) 31G X 5/16" 0.5 ML MISC 1 each by Does not apply route daily.  100 each  1  . Multiple Vitamins-Minerals (CENTRUM SILVER ULTRA MENS) TABS Take 1 tablet by mouth daily.        . pravastatin (PRAVACHOL) 40 MG tablet TAKE TWO TABLETS BY MOUTH EVERY DAY AT BEDTIME  60 tablet  10  . losartan  (COZAAR) 50 MG tablet Take 1 tablet (50 mg total) by mouth daily.  30 tablet  11   No current facility-administered medications on file prior to visit.    No Known Allergies  Family History  Problem Relation Age of Onset  . Diabetes Brother     Type 2    BP 134/76  Pulse 82  Ht 5\' 5"  (1.651 m)  Wt 178 lb (80.74 kg)  BMI 29.62 kg/m2  SpO2 97%    Review of Systems Denies LOC and weight change    Objective:   Physical Exam VITAL SIGNS:  See vs page GENERAL: no distress   Lab Results  Component Value Date   WBC 3.6* 04/21/2013   HGB 13.4 04/21/2013   HCT 39.0 04/21/2013   PLT 166.0 04/21/2013   GLUCOSE 327* 04/21/2013   CHOL 100 04/21/2013   TRIG 101.0 04/21/2013   HDL 34.20* 04/21/2013   LDLDIRECT 49.3 04/23/2012   LDLCALC 46 04/21/2013   ALT 26 04/21/2013   AST 24 04/21/2013   NA 134* 04/21/2013   K 3.8 04/21/2013   CL 97 04/21/2013   CREATININE 1.0 04/21/2013   BUN 11 04/21/2013   CO2 31 04/21/2013  TSH 1.13 04/21/2013   PSA 0.18 04/21/2013   HGBA1C 8.1* 04/21/2013   MICROALBUR 0.5 04/21/2013      Assessment & Plan:  DM: This insulin regimen was chosen from multiple options, for its simplicity.  The benefits of glycemic control must be weighed against the risks of hypoglycemia.  He says he'll try taking the insulin as rx'ed, no matter what the then-current cbg.   Hyponatremia, due to hyperglycemia. Leukopenia: chronic, unchanged

## 2013-05-26 ENCOUNTER — Encounter: Payer: Self-pay | Admitting: Gastroenterology

## 2013-06-09 ENCOUNTER — Other Ambulatory Visit: Payer: Self-pay

## 2013-06-09 MED ORDER — PRAVASTATIN SODIUM 40 MG PO TABS: ORAL_TABLET | ORAL | Status: DC

## 2013-06-24 ENCOUNTER — Other Ambulatory Visit: Payer: Self-pay

## 2013-07-21 ENCOUNTER — Encounter: Payer: Self-pay | Admitting: Gastroenterology

## 2013-07-21 ENCOUNTER — Ambulatory Visit (AMBULATORY_SURGERY_CENTER): Payer: Self-pay | Admitting: *Deleted

## 2013-07-21 VITALS — Ht 65.0 in | Wt 178.0 lb

## 2013-07-21 DIAGNOSIS — Z86010 Personal history of colonic polyps: Secondary | ICD-10-CM

## 2013-07-21 MED ORDER — MOVIPREP 100 G PO SOLR: ORAL | Status: DC

## 2013-07-21 NOTE — Progress Notes (Signed)
Patient denies any allergies to eggs or soy. 

## 2013-08-04 ENCOUNTER — Ambulatory Visit (AMBULATORY_SURGERY_CENTER): Payer: BC Managed Care – PPO | Admitting: Gastroenterology

## 2013-08-04 ENCOUNTER — Encounter: Payer: Self-pay | Admitting: Gastroenterology

## 2013-08-04 VITALS — BP 111/65 | HR 71 | Temp 96.5°F | Resp 17 | Ht 65.0 in | Wt 178.0 lb

## 2013-08-04 DIAGNOSIS — Z1211 Encounter for screening for malignant neoplasm of colon: Secondary | ICD-10-CM

## 2013-08-04 DIAGNOSIS — D126 Benign neoplasm of colon, unspecified: Secondary | ICD-10-CM

## 2013-08-04 DIAGNOSIS — Z86010 Personal history of colonic polyps: Secondary | ICD-10-CM

## 2013-08-04 MED ORDER — DEXTROSE 5 % IV SOLN: INTRAVENOUS | Status: DC

## 2013-08-04 NOTE — Patient Instructions (Signed)
Impressions/recommendations:  Polyps (handout given)  Repeat colonoscopy pending pathology results.  YOU HAD AN ENDOSCOPIC PROCEDURE TODAY AT THE [facility]: Refer to the procedure report that was given to you for any specific questions about what was found during the examination.  If the procedure report does not answer your questions, please call your gastroenterologist to clarify.  If you requested that your care partner not be given the details of your procedure findings, then the procedure report has been included in a sealed envelope for you to review at your convenience later.  YOU SHOULD EXPECT: Some feelings of bloating in the abdomen. Passage of more gas than usual.  Walking can help get rid of the air that was put into your GI tract during the procedure and reduce the bloating. If you had a lower endoscopy (such as a colonoscopy or flexible sigmoidoscopy) you may notice spotting of blood in your stool or on the toilet paper. If you underwent a bowel prep for your procedure, then you may not have a normal bowel movement for a few days.  DIET: Your first meal following the procedure should be a light meal and then it is ok to progress to your normal diet.  A half-sandwich or bowl of soup is an example of a good first meal.  Heavy or fried foods are harder to digest and may make you feel nauseous or bloated.  Likewise meals heavy in dairy and vegetables can cause extra gas to form and this can also increase the bloating.  Drink plenty of fluids but you should avoid alcoholic beverages for 24 hours.  ACTIVITY: Your care partner should take you home directly after the procedure.  You should plan to take it easy, moving slowly for the rest of the day.  You can resume normal activity the day after the procedure however you should NOT DRIVE or use heavy machinery for 24 hours (because of the sedation medicines used during the test).    SYMPTOMS TO REPORT IMMEDIATELY: A  gastroenterologist can be reached at any hour.  During normal business hours, 8:30 AM to 5:00 PM Monday through Friday, call [phone].  After hours and on weekends, please call the GI answering service at [phone] who will take a message and have the physician on call contact you.   Following lower endoscopy (colonoscopy or flexible sigmoidoscopy):  Excessive amounts of blood in the stool  Significant tenderness or worsening of abdominal pains  Swelling of the abdomen that is new, acute  Fever of 100F or higher   FOLLOW UP: If any biopsies were taken you will be contacted by phone or by letter within the next 1-3 weeks.  Call your gastroenterologist if you have not heard about the biopsies in 3 weeks.  Our staff will call the home number listed on your records the next business day following your procedure to check on you and address any questions or concerns that you may have at that time regarding the information given to you following your procedure. This is a courtesy call and so if there is no answer at the home number and we have not heard from you through the emergency physician on call, we will assume that you have returned to your regular daily activities without incident.  SIGNATURES/CONFIDENTIALITY: You and/or your care partner have signed paperwork which will be entered into your electronic medical record.  These signatures attest to the fact that that the information above on your After Visit Summary has   been reviewed and is understood.  Full responsibility of the confidentiality of this discharge information lies with you and/or your care-partner. 

## 2013-08-04 NOTE — Op Note (Signed)
[facility] [address] [place], [address]   COLONOSCOPY PROCEDURE REPORT  PATIENT: Albert Reed, Albert Reed  MR#: [id number] BIRTHDATE: 07/29/1951 , 62  yrs. old GENDER: Male ENDOSCOPIST: Rachael Fee, MD PROCEDURE DATE:  08/04/2013 PROCEDURE:   Colonoscopy, screening First Screening Colonoscopy - Avg.  risk and is 50 yrs.  old or older - No.  Prior Negative Screening - Now for repeat screening. 10 or more years since last screening  History of Adenoma - Now for follow-up colonoscopy & has been > or = to 3 yrs.  N/A  Polyps Removed Today? Yes. ASA CLASS:   Class II INDICATIONS:single HP polyp removed by [facility] in 2004. MEDICATIONS: Fentanyl 50 mcg IV, Versed 5 mg IV, and These medications were titrated to patient response per physician's verbal order  DESCRIPTION OF PROCEDURE:   After the risks benefits and alternatives of the procedure were thoroughly explained, informed consent was obtained.  A digital rectal exam revealed no abnormalities of the rectum.   The LB CF-[id number] Loaner [id number] endoscope was introduced through the anus and advanced to the cecum, which was identified by both the appendix and ileocecal valve. No adverse events experienced.   The quality of the prep was good.  The instrument was then slowly withdrawn as the colon was fully examined.  COLON FINDINGS: Two polyps were found, removed and sent to pathology.  These were both sessile, located in transverse segment, 2-73mm across, removed with cold snare.  The examination was otherwise normal.  Retroflexed views revealed no abnormalities. The time to cecum=2 minutes 11 seconds.  Withdrawal time=9 minutes 45 seconds.  The scope was withdrawn and the procedure completed. COMPLICATIONS: There were no complications.  ENDOSCOPIC IMPRESSION: Two polyps were found, removed and sent to pathology. The examination was otherwise normal.  RECOMMENDATIONS: If the polyp(s) removed today are proven to be  adenomatous (pre-cancerous) polyps, you will need a repeat colonoscopy in 5 years.  Otherwise you should continue to follow colorectal cancer screening guidelines for "routine risk" patients with colonoscopy in 10 years.  You will receive a letter within 1-2 weeks with the results of your biopsy as well as final recommendations.  Please call my office if you have not received a letter after 3 weeks.   eSigned:  Rachael Fee, MD 08/04/2013 9:53 AM   cc: Romero Belling, MD

## 2013-08-05 ENCOUNTER — Telehealth: Payer: Self-pay | Admitting: *Deleted

## 2013-08-05 NOTE — Telephone Encounter (Signed)
  Follow up Call-  Call back number 08/04/2013  Post procedure Call Back phone  # [phone] or  [phone]  Permission to leave phone message Yes     Patient questions:  Do you have a fever, pain , or abdominal swelling? no Pain Score  0 *  Have you tolerated food without any problems? yes  Have you been able to return to your normal activities? yes  Do you have any questions about your discharge instructions: Diet                         no Medications  no Follow up visit  no  Do you have questions or concerns about your Care? no  Actions: * If pain score is 4 or above: No action needed, pain <4.

## 2013-08-11 ENCOUNTER — Encounter: Payer: Self-pay | Admitting: Gastroenterology

## 2013-10-27 ENCOUNTER — Encounter: Payer: Self-pay | Admitting: Endocrinology

## 2013-10-27 ENCOUNTER — Ambulatory Visit (INDEPENDENT_AMBULATORY_CARE_PROVIDER_SITE_OTHER): Payer: BC Managed Care – PPO | Admitting: Endocrinology

## 2013-10-27 ENCOUNTER — Ambulatory Visit
Admission: RE | Admit: 2013-10-27 | Discharge: 2013-10-27 | Disposition: A | Discharge status: Home / Self Care | Payer: BC Managed Care – PPO | Source: Ambulatory Visit | Attending: Endocrinology | Admitting: Endocrinology

## 2013-10-27 VITALS — BP 130/80 | HR 97 | Temp 98.2°F | Ht 65.0 in | Wt 180.0 lb

## 2013-10-27 DIAGNOSIS — E119 Type 2 diabetes mellitus without complications: Secondary | ICD-10-CM

## 2013-10-27 DIAGNOSIS — M79644 Pain in right finger(s): Secondary | ICD-10-CM | POA: Insufficient documentation

## 2013-10-27 DIAGNOSIS — M79609 Pain in unspecified limb: Secondary | ICD-10-CM

## 2013-10-27 LAB — SEDIMENTATION RATE: SED RATE: 24 mm/h — AB (ref 0–22)

## 2013-10-27 LAB — URIC ACID: URIC ACID, SERUM: 6.7 mg/dL (ref 4.0–7.8)

## 2013-10-27 LAB — HEMOGLOBIN A1C: Hgb A1c MFr Bld: 10.3 % — ABNORMAL HIGH (ref 4.6–6.5)

## 2013-10-27 MED ORDER — PRAVASTATIN SODIUM 40 MG PO TABS: ORAL_TABLET | ORAL | Status: DC

## 2013-10-27 NOTE — Patient Instructions (Addendum)
blood tests are being requested for you today.  We'll contact you with results.  Please also do an x-ray downstairs. check your blood sugar twice a day.  vary the time of day when you check, between before the 3 meals, and at bedtime.  also check if you have symptoms of your blood sugar being too high or too low.  please keep a record of the readings and bring it to your next appointment here.  please call us sooner if your blood sugar goes below 70, or if you have a lot of readings over 200.   Please come back for a regular physical appointment soon.   i have sent a prescription to your pharmacy, for the cholesterol pill.

## 2013-10-27 NOTE — Progress Notes (Signed)
   Subjective:    Patient ID: Albert Reed, male    DOB: 03/13/1951, 63 y.o.   MRN: [id number]  HPI Pt returns for f/u of type 2 DM (dx'ed 1997; he has mild if any neuropathy of the lower extremities.no known associated complications; he has been on insulin since 2013; he has never had severe hypoglycemia or DKA; he has chosen a simple, inexpensive insulin regimen).  pt states he feels well in general.  no cbg record, but states cbg's vary from 67-200, but most are in the 100's.  It is in general higher as the day goes on.   Pt states few mos of moderate swelling of the right little finger, and slight assoc pain.  He is unable to cite precip factor, such as injury. Past Medical History  Diagnosis Date  . HYPERTHYROIDISM 11/09/2009  . DIVERTICULOSIS, COLON 04/01/2007  . COLONIC POLYPS, HX OF 04/01/2007  . HYPERTENSION 04/01/2007  . HYPERCHOLESTEROLEMIA 07/28/2008  . LEUKOPENIA, CHRONIC 07/28/2008  . DM 11/24/2008    Past Surgical History  Procedure Laterality Date  . Bunionectomy      History   Social History  . Marital Status: Married    Spouse Name: N/A    Number of Children: N/A  . Years of Education: N/A   Occupational History  . 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 Smoking status: Never Smoker   . Smokeless tobacco: Never Used  . Alcohol Use: No  . Drug Use: No  . Sexual Activity: Not on file   Other Topics Concern  . Not on file   Social History Narrative   Originally from [place] (in [place] since 1996)    Current Outpatient Prescriptions on File Prior to Visit  Medication Sig Dispense Refill  . aspirin 81 MG tablet Take 81 mg by mouth daily.        Marland Kitchen glucose blood (ONE TOUCH ULTRA TEST) test strip       . Insulin Syringe-Needle U-100 (BD INSULIN SYRINGE ULTRAFINE) 31G X 5/16" 0.5 ML MISC 1 each by Does not apply route daily.  100 each  1  . Multiple Vitamins-Minerals (CENTRUM SILVER ULTRA MENS) TABS Take 1 tablet by mouth daily.           No current facility-administered medications on file prior to visit.    No Known Allergies  Family History  Problem Relation Age of Onset  . Diabetes Brother     Type 2  . Colon cancer Neg Hx     patient dose not know much about family    BP 130/80  Pulse 97  Temp(Src) 98.2 F (36.8 C) (Oral)  Ht 5\' 5"  (1.651 m)  Wt 180 lb (81.647 kg)  BMI 29.95 kg/m2  SpO2 98%  Review of Systems denies hypoglycemia and weight change.      Objective:   Physical Exam VITAL SIGNS:  See vs page GENERAL: no distress Right little finger: at the dip joint, there is moderate swelling, but no tend/warmth/erythema.   Lab Results  Component Value Date   HGBA1C 10.3* 10/27/2013      Assessment & Plan:  DM: poor control.  He needs increased rx Finger swelling, new, apparently due to OA

## 2013-11-24 ENCOUNTER — Other Ambulatory Visit: Payer: Self-pay | Admitting: Endocrinology

## 2014-03-30 ENCOUNTER — Ambulatory Visit (INDEPENDENT_AMBULATORY_CARE_PROVIDER_SITE_OTHER): Payer: BC Managed Care – PPO | Admitting: Endocrinology

## 2014-03-30 ENCOUNTER — Encounter: Payer: Self-pay | Admitting: Endocrinology

## 2014-03-30 VITALS — BP 134/80 | HR 91 | Temp 98.7°F | Ht 65.0 in | Wt 179.0 lb

## 2014-03-30 DIAGNOSIS — I10 Essential (primary) hypertension: Secondary | ICD-10-CM

## 2014-03-30 DIAGNOSIS — Z79899 Other long term (current) drug therapy: Secondary | ICD-10-CM

## 2014-03-30 DIAGNOSIS — Z0000 Encounter for general adult medical examination without abnormal findings: Secondary | ICD-10-CM

## 2014-03-30 DIAGNOSIS — Z125 Encounter for screening for malignant neoplasm of prostate: Secondary | ICD-10-CM

## 2014-03-30 DIAGNOSIS — E119 Type 2 diabetes mellitus without complications: Secondary | ICD-10-CM

## 2014-03-30 DIAGNOSIS — E0590 Thyrotoxicosis, unspecified without thyrotoxic crisis or storm: Secondary | ICD-10-CM

## 2014-03-30 DIAGNOSIS — D72819 Decreased white blood cell count, unspecified: Secondary | ICD-10-CM

## 2014-03-30 LAB — CBC WITH DIFFERENTIAL/PLATELET
BASOS PCT: 0.4 % (ref 0.0–3.0)
Basophils Absolute: 0 10*3/uL (ref 0.0–0.1)
Eosinophils Absolute: 0.1 10*3/uL (ref 0.0–0.7)
Eosinophils Relative: 1.8 % (ref 0.0–5.0)
HCT: 39.6 % (ref 39.0–52.0)
HEMOGLOBIN: 13.3 g/dL (ref 13.0–17.0)
LYMPHS PCT: 34.9 % (ref 12.0–46.0)
Lymphs Abs: 1.5 10*3/uL (ref 0.7–4.0)
MCHC: 33.6 g/dL (ref 30.0–36.0)
MCV: 88.3 fl (ref 78.0–100.0)
Monocytes Absolute: 0.4 10*3/uL (ref 0.1–1.0)
Monocytes Relative: 9.9 % (ref 3.0–12.0)
Neutro Abs: 2.3 10*3/uL (ref 1.4–7.7)
Neutrophils Relative %: 53 % (ref 43.0–77.0)
Platelets: 161 10*3/uL (ref 150.0–400.0)
RBC: 4.49 Mil/uL (ref 4.22–5.81)
RDW: 15 % (ref 11.5–15.5)
WBC: 4.3 10*3/uL (ref 4.0–10.5)

## 2014-03-30 LAB — HEPATIC FUNCTION PANEL
ALT: 25 U/L (ref 0–53)
AST: 25 U/L (ref 0–37)
Albumin: 3.8 g/dL (ref 3.5–5.2)
Alkaline Phosphatase: 99 U/L (ref 39–117)
BILIRUBIN DIRECT: 0 mg/dL (ref 0.0–0.3)
TOTAL PROTEIN: 6.7 g/dL (ref 6.0–8.3)
Total Bilirubin: 0.5 mg/dL (ref 0.2–1.2)

## 2014-03-30 LAB — URINALYSIS, ROUTINE W REFLEX MICROSCOPIC
Bilirubin Urine: NEGATIVE
HGB URINE DIPSTICK: NEGATIVE
Ketones, ur: NEGATIVE
Leukocytes, UA: NEGATIVE
NITRITE: NEGATIVE
RBC / HPF: NONE SEEN (ref 0–?)
Specific Gravity, Urine: 1.01 (ref 1.000–1.030)
Total Protein, Urine: NEGATIVE
Urine Glucose: >=1000 — AB
Urobilinogen, UA: 0.2 (ref 0.0–1.0)
pH: 7 (ref 5.0–8.0)

## 2014-03-30 LAB — LIPID PANEL
CHOL/HDL RATIO: 3
CHOLESTEROL: 95 mg/dL (ref 0–200)
HDL: 33 mg/dL — ABNORMAL LOW (ref >39.00)
LDL CALC: 26 mg/dL (ref 0–99)
NonHDL: 62
TRIGLYCERIDES: 180 mg/dL — AB (ref 0.0–149.0)
VLDL: 36 mg/dL (ref 0.0–40.0)

## 2014-03-30 LAB — MICROALBUMIN / CREATININE URINE RATIO
CREATININE, U: 61.4 mg/dL
MICROALB UR: 0.8 mg/dL (ref 0.0–1.9)
MICROALB/CREAT RATIO: 1.3 mg/g (ref 0.0–30.0)

## 2014-03-30 LAB — BASIC METABOLIC PANEL
BUN: 11 mg/dL (ref 6–23)
CO2: 28 mEq/L (ref 19–32)
CREATININE: 1.1 mg/dL (ref 0.4–1.5)
Calcium: 8.9 mg/dL (ref 8.4–10.5)
Chloride: 99 mEq/L (ref 96–112)
GFR: 84.36 mL/min (ref >60.00)
Glucose, Bld: 290 mg/dL — ABNORMAL HIGH (ref 70–99)
Potassium: 4.1 mEq/L (ref 3.5–5.1)
Sodium: 135 mEq/L (ref 135–145)

## 2014-03-30 LAB — HEMOGLOBIN A1C
HEMOGLOBIN A1C: 9.4 % — AB (ref 4.6–6.5)
HEMOGLOBIN A1C: 9.4 % — AB (ref 4.6–6.5)

## 2014-03-30 LAB — URIC ACID: URIC ACID, SERUM: 6.1 mg/dL (ref 4.0–7.8)

## 2014-03-30 LAB — TSH: TSH: 0.47 u[IU]/mL (ref 0.35–4.50)

## 2014-03-30 LAB — PSA: PSA: 0.3 ng/mL (ref 0.10–4.00)

## 2014-03-30 MED ORDER — DOXYCYCLINE HYCLATE 100 MG PO TABS: 100.0000 mg | ORAL_TABLET | Freq: Two times a day (BID) | ORAL | Status: AC

## 2014-03-30 NOTE — Progress Notes (Signed)
Subjective:    Patient ID: Albert Reed, male    DOB: 1950/11/25, 63 y.o.   MRN: [id number]  HPI Pt is here for regular wellness examination, and is feeling pretty well in general, and says chronic med probs are stable, except as noted below Past Medical History  Diagnosis Date  . HYPERTHYROIDISM 11/09/2009  . DIVERTICULOSIS, COLON 04/01/2007  . COLONIC POLYPS, HX OF 04/01/2007  . HYPERTENSION 04/01/2007  . HYPERCHOLESTEROLEMIA 07/28/2008  . LEUKOPENIA, CHRONIC 07/28/2008  . DM 11/24/2008    Past Surgical History  Procedure Laterality Date  . Bunionectomy      History   Social History  . Marital Status: Married    Spouse Name: N/A    Number of Children: N/A  . Years of Education: N/A   Occupational History  . 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 Smoking status: Never Smoker   . Smokeless tobacco: Never Used  . Alcohol Use: No  . Drug Use: No  . Sexual Activity: Not on file   Other Topics Concern  . Not on file   Social History Narrative   Originally from [place] (in [place] since [place])    Current Outpatient Prescriptions on File Prior to Visit  Medication Sig Dispense Refill  . aspirin 81 MG tablet Take 81 mg by mouth daily.        Marland Kitchen glucose blood (ONE TOUCH ULTRA TEST) test strip       . insulin NPH-regular Human (NOVOLIN 70/30) (70-30) 100 UNIT/ML injection 70 units with breakfast, and 10 units with the evening meal      . Insulin Syringe-Needle U-100 (BD INSULIN SYRINGE ULTRAFINE) 31G X 5/16" 0.5 ML MISC 1 each by Does not apply route daily.  100 each  1  . Multiple Vitamins-Minerals (CENTRUM SILVER ULTRA MENS) TABS Take 1 tablet by mouth daily.        . ONE TOUCH ULTRA TEST test strip USE ONE TEST STRIP TO CHECK BLOOD SUGAR EVERY DAY  100 each  2  . pravastatin (PRAVACHOL) 40 MG tablet TAKE TWO TABLETS BY MOUTH EVERY DAY AT BEDTIME  60 tablet  10   No current facility-administered medications on file prior to visit.    No Known  Allergies  Family History  Problem Relation Age of Onset  . Diabetes Brother     Type 2  . Colon cancer Neg Hx     patient dose not know much about family    BP 134/80  Pulse 91  Temp(Src) 98.7 F (37.1 C) (Oral)  Ht 5\' 5"  (1.651 m)  Wt 179 lb (81.194 kg)  BMI 29.79 kg/m2  SpO2 97%  Review of Systems  Constitutional: Negative for unexpected weight change.  HENT: Negative for hearing loss.   Eyes: Negative for visual disturbance.  Respiratory: Negative for shortness of breath.   Cardiovascular: Negative for chest pain.  Gastrointestinal: Negative for anal bleeding.  Endocrine: Negative for cold intolerance.  Genitourinary: Negative for hematuria and difficulty urinating.  Musculoskeletal: Negative for back pain.  Skin: Negative for rash.  Allergic/Immunologic: Negative for environmental allergies.  Neurological: Negative for syncope and numbness.  Hematological: Does not bruise/bleed easily.  Psychiatric/Behavioral: Negative for dysphoric mood.       Objective:   Physical Exam VS: see vs page GEN: no distress HEAD: head: no deformity eyes: no periorbital swelling, no proptosis external nose and ears are normal mouth: no lesion seen NECK: supple,  thyroid is not enlarged CHEST WALL: no deformity LUNGS: clear to auscultation BREASTS:  No gynecomastia CV: reg rate and rhythm, no murmur ABD: abdomen is soft, nontender.  no hepatosplenomegaly.  not distended.  no hernia RECTAL: normal external and internal exam.  heme neg. PROSTATE:  Normal size.  No nodule MUSCULOSKELETAL: muscle bulk and strength are grossly normal.  no obvious joint swelling.  gait is normal and steady EXTEMITIES: no deformity.  no ulcer on the feet.  feet are of normal color and temp.  no edema PULSES: dorsalis pedis intact bilat.  no carotid bruit NEURO:  cn 2-12 grossly intact.   readily moves all 4's.  sensation is intact to touch on the feet SKIN:  Normal texture and temperature.  No rash or  suspicious lesion is visible.   NODES:  None palpable at the neck PSYCH: alert, well-oriented.  Does not appear anxious nor depressed.      Assessment & Plan:  Wellness visit today, with problems stable, except as noted. we discussed code status.  pt requests full code, but would not want to be started or maintained on artificial life-support measures if there was not a reasonable chance of recovery. Patient is advised the following: Patient Instructions  blood tests are being requested for you today.  We'll contact you with results.  check your blood sugar twice a day.  vary the time of day when you check, between before the 3 meals, and at bedtime.  also check if you have symptoms of your blood sugar being too high or too low.  please keep a record of the readings and bring it to your next appointment here.  please call us sooner if your blood sugar goes below 70, or if you have a lot of readings over 200.   Please come back for a follow-up appointment in 3 months   please consider these measures for your health:  minimize alcohol.  do not use tobacco products.  have a colonoscopy at least every 10 years from age 35.   keep firearms safely stored.  always use seat belts.  have working smoke alarms in your home.  see an eye doctor and dentist regularly.  never drive under the influence of alcohol or drugs (including prescription drugs).   i have sent a prescription to your pharmacy, for an antibiotic pill.           SEPARATE EVALUATION FOLLOWS--EACH PROBLEM HERE IS NEW, NOT RESPONDING TO TREATMENT, OR POSES SIGNIFICANT RISK TO THE PATIENT'S HEALTH: HISTORY OF THE PRESENT ILLNESS: Pt returns for f/u of type 2 DM (dx'ed 1997; he has mild if any neuropathy of the lower extremities.no known associated complications; he has been on insulin since 2013; he has never had pancreatitis, severe hypoglycemia, or DKA; he has chosen a simple, inexpensive insulin regimen).  pt states he feels well in  general.  no cbg record, but states cbg's vary from 70-200's, but most are in the 100's.  It is in general higher as the day goes on.  Pt says pustule at the right buttock has recurred. PAST MEDICAL HISTORY reviewed and up to date today REVIEW OF SYSTEMS: Denies hypoglycemia and fever PHYSICAL EXAMINATION: VITAL SIGNS:  See vs page GENERAL: no distress Right buttock: small pustule LAB/XRAY RESULTS: i reviewed electrocardiogram Lab Results  Component Value Date   HGBA1C 9.4* 03/30/2014   HGBA1C 9.4* 03/30/2014  IMPRESSION: DM: moderate exacerbation Pustule: worse PLAN:  i rx'ed abx. Increase insulin to 70 units with  breakfast, and 10 units with the evening meal

## 2014-03-30 NOTE — Patient Instructions (Addendum)
blood tests are being requested for you today.  We'll contact you with results.  check your blood sugar twice a day.  vary the time of day when you check, between before the 3 meals, and at bedtime.  also check if you have symptoms of your blood sugar being too high or too low.  please keep a record of the readings and bring it to your next appointment here.  please call us sooner if your blood sugar goes below 70, or if you have a lot of readings over 200.   Please come back for a follow-up appointment in 3 months   please consider these measures for your health:  minimize alcohol.  do not use tobacco products.  have a colonoscopy at least every 10 years from age 21.   keep firearms safely stored.  always use seat belts.  have working smoke alarms in your home.  see an eye doctor and dentist regularly.  never drive under the influence of alcohol or drugs (including prescription drugs).   i have sent a prescription to your pharmacy, for an antibiotic pill.

## 2014-04-20 LAB — HM DIABETES EYE EXAM

## 2014-04-22 ENCOUNTER — Encounter: Payer: Self-pay | Admitting: Endocrinology

## 2014-05-19 ENCOUNTER — Other Ambulatory Visit: Payer: Self-pay | Admitting: Endocrinology

## 2014-07-13 ENCOUNTER — Ambulatory Visit (INDEPENDENT_AMBULATORY_CARE_PROVIDER_SITE_OTHER): Payer: BC Managed Care – PPO

## 2014-07-13 DIAGNOSIS — Z23 Encounter for immunization: Secondary | ICD-10-CM

## 2014-08-29 ENCOUNTER — Other Ambulatory Visit: Payer: Self-pay | Admitting: Endocrinology

## 2014-09-28 ENCOUNTER — Ambulatory Visit (INDEPENDENT_AMBULATORY_CARE_PROVIDER_SITE_OTHER): Payer: BLUE CROSS/BLUE SHIELD | Admitting: Endocrinology

## 2014-09-28 ENCOUNTER — Encounter: Payer: Self-pay | Admitting: Endocrinology

## 2014-09-28 VITALS — BP 134/66 | HR 95 | Temp 98.7°F | Ht 65.0 in | Wt 180.0 lb

## 2014-09-28 DIAGNOSIS — E119 Type 2 diabetes mellitus without complications: Secondary | ICD-10-CM

## 2014-09-28 LAB — HEMOGLOBIN A1C: Hgb A1c MFr Bld: 10.3 % — ABNORMAL HIGH (ref 4.6–6.5)

## 2014-09-28 NOTE — Patient Instructions (Addendum)
blood tests are being requested for you today.  We'll let you know about the results.  Because of the pattern of your blood sugar (higher later in the day), it is important to take 70 units with breakfast, and 10 units with the evening meal.  Please let me know if you want to see a urology specialist.   check your blood sugar twice a day.  vary the time of day when you check, between before the 3 meals, and at bedtime.  also check if you have symptoms of your blood sugar being too high or too low.  please keep a record of the readings and bring it to your next appointment here.  You can write it on any piece of paper.  please call us sooner if your blood sugar goes below 70, or if you have a lot of readings over 200.  Please come back for a follow-up appointment in 3 months.

## 2014-09-28 NOTE — Progress Notes (Signed)
Subjective:    Patient ID: Albert Reed, male    DOB: [birth year], 64 y.o.   MRN: [id number]  HPI Pt returns for f/u of diabetes mellitus: DM type: Insulin-requiring type 2 Dx'ed: 8115 Complications: none Therapy: insulin since 2013 DKA: never Severe hypoglycemia: never Pancreatitis: never Other: he has chosen a simple, inexpensive insulin regimen Interval history: he brings a record of his cbg's which i have reviewed today.  It varies from 66-200.  It is in general higher as the day goes on.  He spreads his insulin out throughout the day.  He seldom has hypoglycemia, and these episodes are mild.  These happen in am.    Past Medical History  Diagnosis Date  . HYPERTHYROIDISM 11/09/2009  . DIVERTICULOSIS, COLON 04/01/2007  . COLONIC POLYPS, HX OF 04/01/2007  . HYPERTENSION 04/01/2007  . HYPERCHOLESTEROLEMIA 07/28/2008  . LEUKOPENIA, CHRONIC 07/28/2008  . DM 11/24/2008    Past Surgical History  Procedure Laterality Date  . Bunionectomy      History   Social History  . Marital Status: Married    Spouse Name: N/A  . Number of Children: N/A  . Years of Education: N/A   Occupational History  . 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 Smoking status: Never Smoker   . Smokeless tobacco: Never Used  . Alcohol Use: No  . Drug Use: No  . Sexual Activity: Not on file   Other Topics Concern  . Not on file   Social History Narrative   Originally from [place] (in [place] since [place])    Current Outpatient Prescriptions on File Prior to Visit  Medication Sig Dispense Refill  . aspirin 81 MG tablet Take 81 mg by mouth daily.      Marland Kitchen glucose blood (ONE TOUCH ULTRA TEST) test strip     . Insulin Syringe-Needle U-100 (BD INSULIN SYRINGE ULTRAFINE) 31G X 5/16" 0.5 ML MISC 1 each by Does not apply route daily. 100 each 1  . Multiple Vitamins-Minerals (CENTRUM SILVER ULTRA MENS) TABS Take 1 tablet by mouth daily.      . ONE TOUCH ULTRA TEST test strip USE ONE  TEST STRIP TO CHECK BLOOD SUGAR EVERY DAY 100 each 2  . pravastatin (PRAVACHOL) 40 MG tablet TAKE TWO TABLETS BY MOUTH EVERY DAY AT BEDTIME (Patient not taking: Reported on 09/28/2014) 60 tablet 10   No current facility-administered medications on file prior to visit.    No Known Allergies  Family History  Problem Relation Age of Onset  . Diabetes Brother     Type 2  . Colon cancer Neg Hx     patient dose not know much about family    BP 134/66 mmHg  Pulse 95  Temp(Src) 98.7 F (37.1 C) (Oral)  Ht 5\' 5"  (1.651 m)  Wt 180 lb (81.647 kg)  BMI 29.95 kg/m2  SpO2 95%  Review of Systems Denies LOC and weight change.  He seldom has enuresis    Objective:   Physical Exam VITAL SIGNS:  See vs page GENERAL: no distress Pulses: dorsalis pedis intact bilat.   MSK: no deformity of the feet CV: no leg edema Skin:  no ulcer on the feet.  normal color and temp on the feet. Neuro: sensation is intact to touch on the feet.    Lab Results  Component Value Date   HGBA1C 10.3* 09/28/2014       Assessment & Plan:  DM: severe  exacerbation Enuresis, new, uncertain etiology Noncompliance with insulin dosing: I'll work around this as best I can    Patient is advised the following: Patient Instructions  blood tests are being requested for you today.  We'll let you know about the results.  Because of the pattern of your blood sugar (higher later in the day), it is important to take 70 units with breakfast, and 10 units with the evening meal.  Please let me know if you want to see a urology specialist.   check your blood sugar twice a day.  vary the time of day when you check, between before the 3 meals, and at bedtime.  also check if you have symptoms of your blood sugar being too high or too low.  please keep a record of the readings and bring it to your next appointment here.  You can write it on any piece of paper.  please call us sooner if your blood sugar goes below 70, or if you have  a lot of readings over 200.  Please come back for a follow-up appointment in 3 months.

## 2014-10-12 ENCOUNTER — Telehealth: Payer: Self-pay | Admitting: Endocrinology

## 2014-10-12 NOTE — Telephone Encounter (Signed)
please call patient: i need to know why pt needs this med

## 2014-10-12 NOTE — Telephone Encounter (Signed)
Left message for pt to call back to discuss need for abx

## 2014-10-13 NOTE — Telephone Encounter (Signed)
Informed pt of what Dr. Loanne Drilling was asking. He did not think he needed it but the pharmacy thought he did and asked him to ask [place] for approval. Disregard this refill pt doesn't need it.

## 2014-10-13 NOTE — Telephone Encounter (Signed)
Noted  

## 2014-12-06 ENCOUNTER — Other Ambulatory Visit: Payer: Self-pay | Admitting: Endocrinology

## 2014-12-12 IMAGING — CR DG HAND COMPLETE 3+V*R*
3 series · 3 of 3 positions shown · non-contrast
Comparison: none

[x hand pa right]
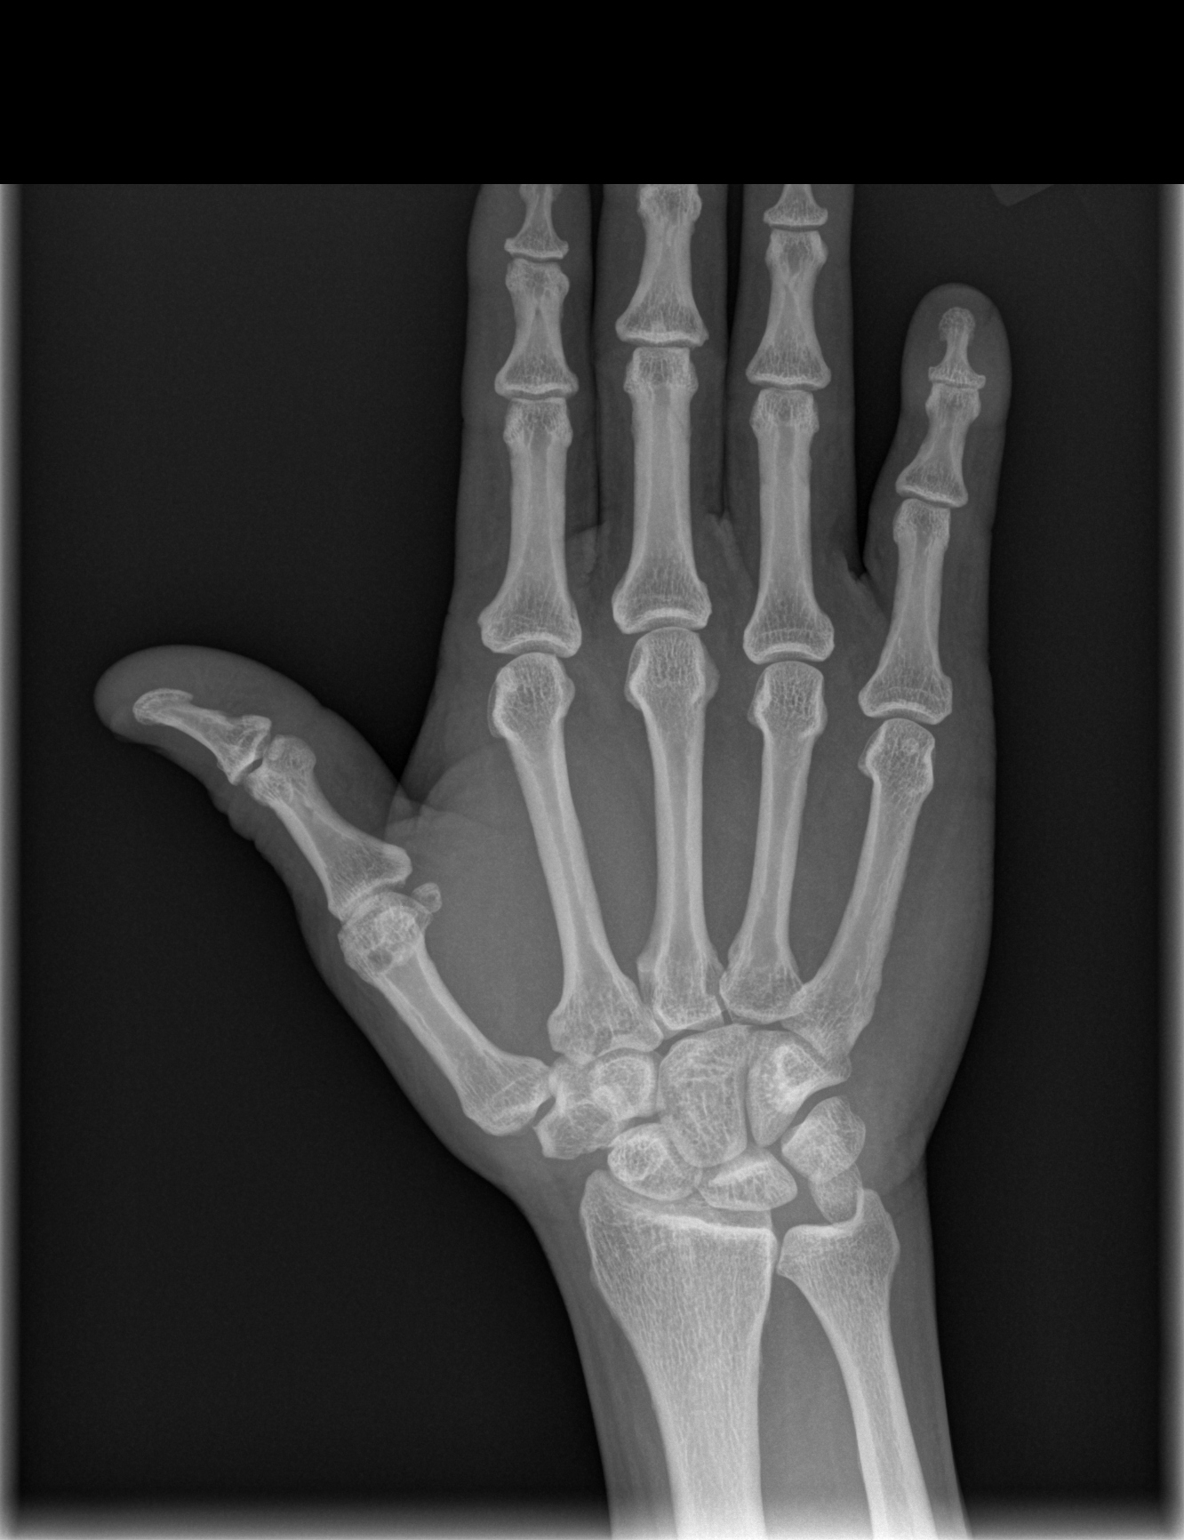

[x hand oblique right]
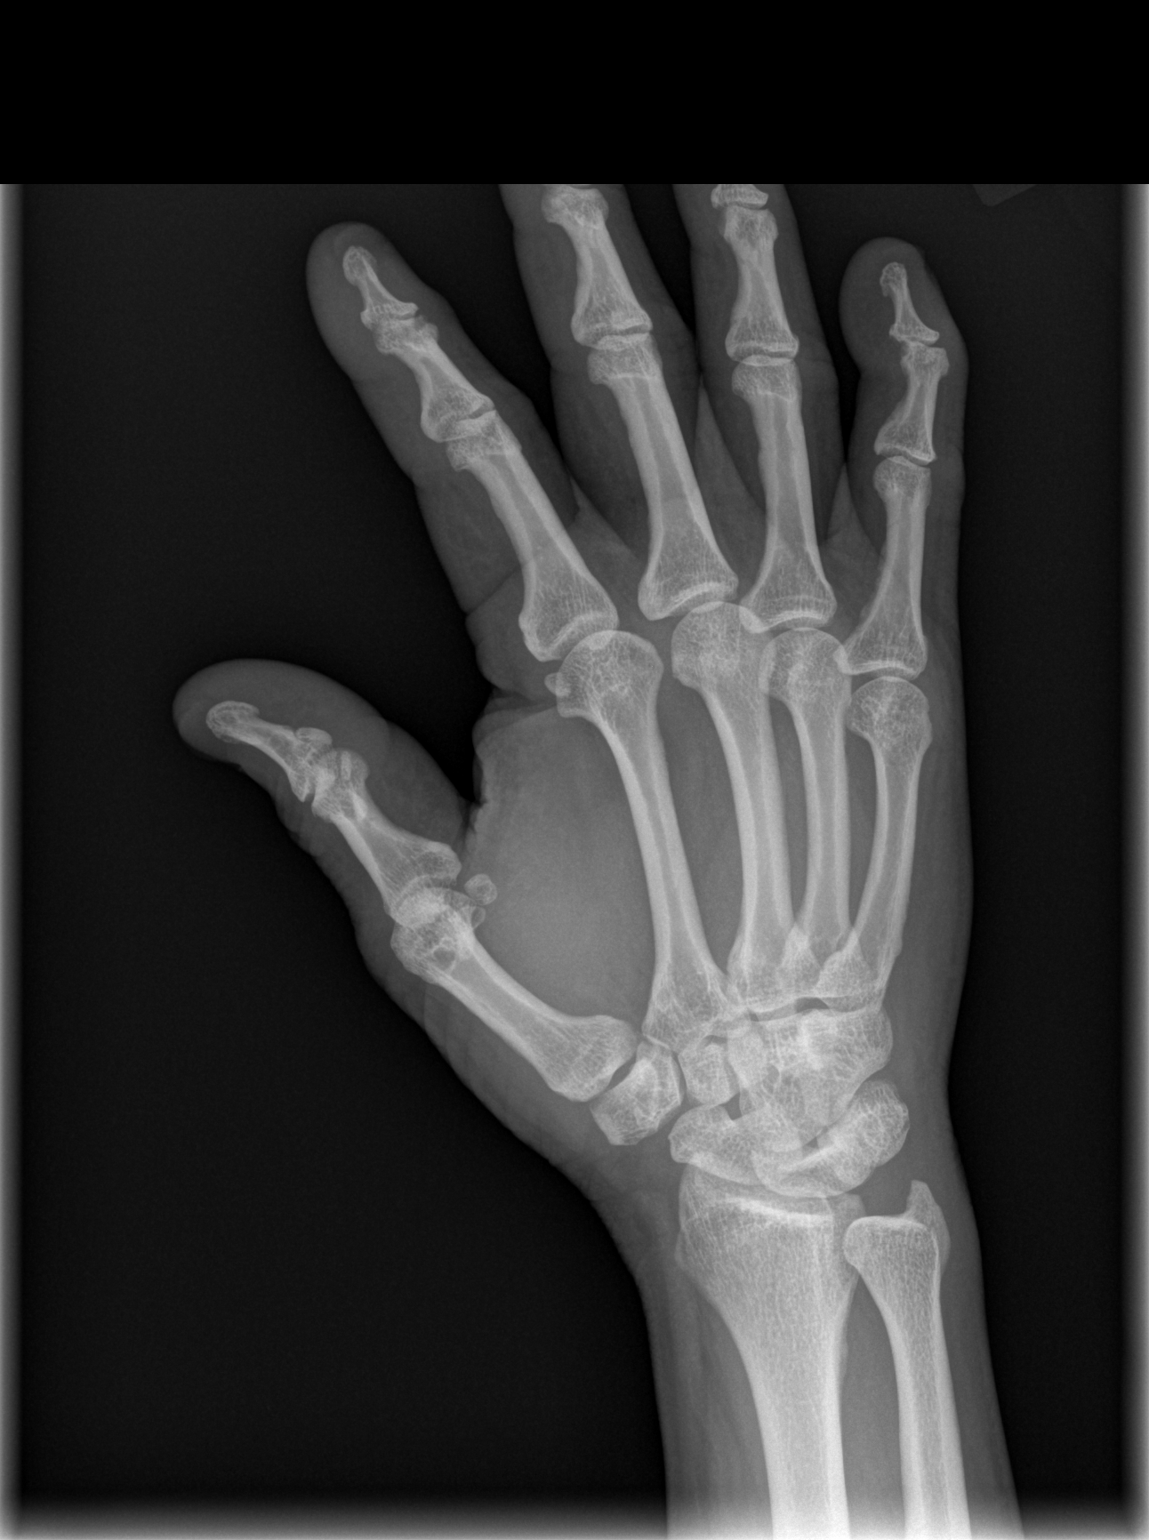

[x hand lat right]
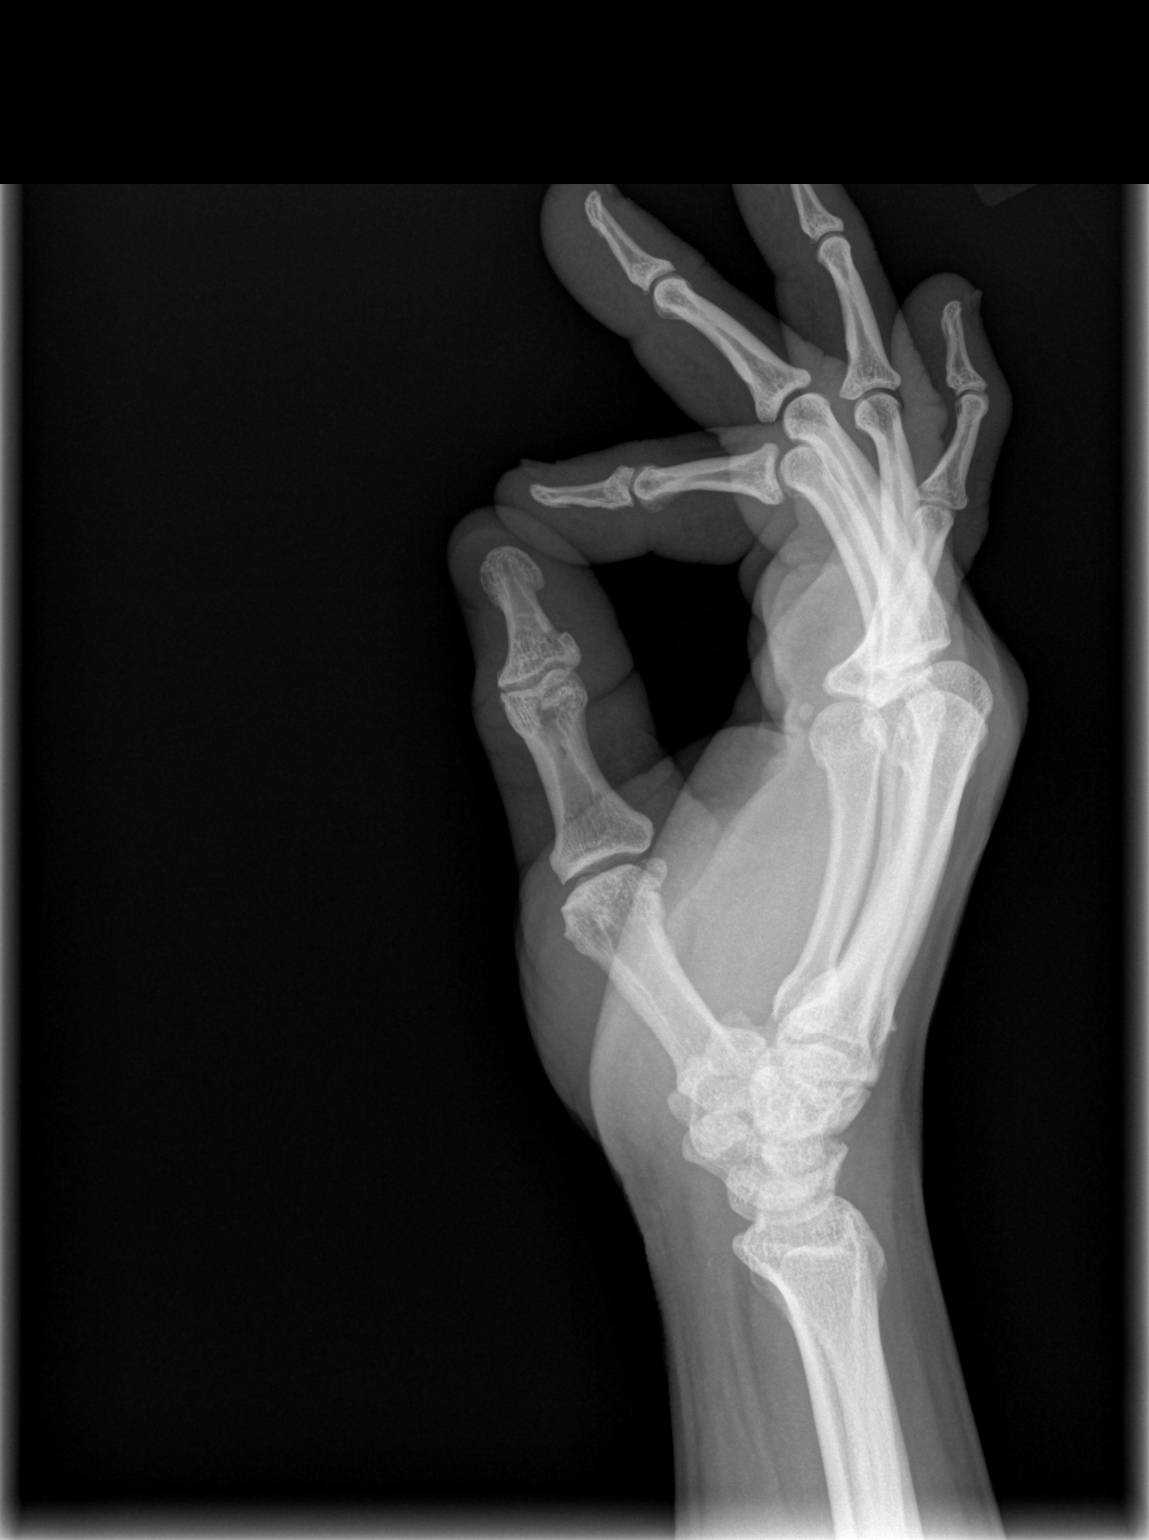

[3 of 3 positions shown; findings below may reference images not displayed]

CLINICAL DATA
Pain in the distal fifth finger, no injury

EXAM
RIGHT HAND - COMPLETE 3+ VIEW

COMPARISON
None.

FINDINGS
The does appear to be soft tissue swelling distally in the right
fifth finger, but no bony abnormality is seen. Joint spaces appear
well preserved with only mild degenerative change involving the DIP
joints. No erosion is seen.

IMPRESSION
Mild degenerative change in the DIP joints.  No acute abnormality.

SIGNATURE

## 2014-12-27 ENCOUNTER — Ambulatory Visit: Payer: BLUE CROSS/BLUE SHIELD | Admitting: Endocrinology

## 2014-12-28 ENCOUNTER — Ambulatory Visit (INDEPENDENT_AMBULATORY_CARE_PROVIDER_SITE_OTHER): Payer: BLUE CROSS/BLUE SHIELD | Admitting: Endocrinology

## 2014-12-28 VITALS — BP 140/80 | HR 93 | Temp 98.0°F | Wt 180.0 lb

## 2014-12-28 DIAGNOSIS — E119 Type 2 diabetes mellitus without complications: Secondary | ICD-10-CM | POA: Diagnosis not present

## 2014-12-28 LAB — HEMOGLOBIN A1C: Hgb A1c MFr Bld: 9.5 % — ABNORMAL HIGH (ref 4.6–6.5)

## 2014-12-28 MED ORDER — INSULIN ASPART 100 UNIT/ML FLEXPEN: PEN_INJECTOR | SUBCUTANEOUS | Status: DC

## 2014-12-28 MED ORDER — GLUCOSE BLOOD VI STRP: 1.0000 | ORAL_STRIP | Freq: Two times a day (BID) | Status: DC

## 2014-12-28 NOTE — Progress Notes (Signed)
Subjective:    Patient ID: Albert Reed, male    DOB: [birth year], 64 y.o.   MRN: [id number]  HPI Pt returns for f/u of diabetes mellitus: DM type: Insulin-requiring type 2 Dx'ed: 2993 Complications: none Therapy: insulin since 2013 DKA: never Severe hypoglycemia: never Pancreatitis: never Other: he has chosen a simple, inexpensive insulin regimen Interval history: If cbg is 100 in am, and he takes more than 40 units, it goes too low in am.  He usually takes 40 qam and 20 qpm.  he brings a record of his cbg's which i have reviewed today (he checks in am only).  It varies from 61-232.   Past Medical History  Diagnosis Date  . HYPERTHYROIDISM 11/09/2009  . DIVERTICULOSIS, COLON 04/01/2007  . COLONIC POLYPS, HX OF 04/01/2007  . HYPERTENSION 04/01/2007  . HYPERCHOLESTEROLEMIA 07/28/2008  . LEUKOPENIA, CHRONIC 07/28/2008  . DM 11/24/2008    Past Surgical History  Procedure Laterality Date  . Bunionectomy      History   Social History  . Marital Status: Married    Spouse Name: N/A  . Number of Children: N/A  . Years of Education: N/A   Occupational History  . 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 Smoking status: Never Smoker   . Smokeless tobacco: Never Used  . Alcohol Use: No  . Drug Use: No  . Sexual Activity: Not on file   Other Topics Concern  . Not on file   Social History Narrative   Originally from [place] (in [place] since [place])    Current Outpatient Prescriptions on File Prior to Visit  Medication Sig Dispense Refill  . aspirin 81 MG tablet Take 81 mg by mouth daily.      Marland Kitchen glucose blood (ONE TOUCH ULTRA TEST) test strip     . Insulin Syringe-Needle U-100 (BD INSULIN SYRINGE ULTRAFINE) 31G X 5/16" 0.5 ML MISC 1 each by Does not apply route daily. 100 each 1  . Multiple Vitamins-Minerals (CENTRUM SILVER ULTRA MENS) TABS Take 1 tablet by mouth daily.      Marland Kitchen NOVOLIN 70/30 RELION (70-30) 100 UNIT/ML injection INJECT 40 UNITS WITH  BREAKFAST, AND 20 UNITS WITH EVENING MEAL 20 mL 2  . pravastatin (PRAVACHOL) 40 MG tablet TAKE TWO TABLETS BY MOUTH EVERY DAY AT BEDTIME (Patient not taking: Reported on 09/28/2014) 60 tablet 10   No current facility-administered medications on file prior to visit.    No Known Allergies  Family History  Problem Relation Age of Onset  . Diabetes Brother     Type 2  . Colon cancer Neg Hx     patient dose not know much about family    BP 140/80 mmHg  Pulse 93  Temp(Src) 98 F (36.7 C) (Oral)  Wt 180 lb (81.647 kg)  SpO2 97%  Review of Systems Denies LOC and weight change    Objective:   Physical Exam VITAL SIGNS:  See vs page GENERAL: no distress Pulses: dorsalis pedis intact bilat.   MSK: no deformity of the feet CV: no leg edema Skin:  no ulcer on the feet.  normal color and temp on the feet. Neuro: sensation is intact to touch on the feet   Lab Results  Component Value Date   HGBA1C 9.5* 12/28/2014      Assessment & Plan:  DM: persistent poor control. Noncompliance with cbg recording and insulin dosing, persistent: I'll work around this as best I  can, but this continues to limit glycemic control.    Patient is advised the following: Patient Instructions  blood tests are being requested for you today.  We'll let you know about the results.   Because of the pattern of your blood sugar (higher later in the day), it is important to take 50 units with breakfast, and 10 units with the evening meal.  i have sent a prescription to your pharmacy, for the insulin pen.  Here is a discount card.   check your blood sugar twice a day.  vary the time of day when you check, between before the 3 meals, and at bedtime.  also check if you have symptoms of your blood sugar being too high or too low.  please keep a record of the readings and bring it to your next appointment here.  You can write it on any piece of paper.  please call us sooner if your blood sugar goes below 70, or if you  have a lot of readings over 200.   Please come back for a follow-up appointment in 3 months.

## 2014-12-28 NOTE — Patient Instructions (Addendum)
blood tests are being requested for you today.  We'll let you know about the results.   Because of the pattern of your blood sugar (higher later in the day), it is important to take 50 units with breakfast, and 10 units with the evening meal.  i have sent a prescription to your pharmacy, for the insulin pen.  Here is a discount card.   check your blood sugar twice a day.  vary the time of day when you check, between before the 3 meals, and at bedtime.  also check if you have symptoms of your blood sugar being too high or too low.  please keep a record of the readings and bring it to your next appointment here.  You can write it on any piece of paper.  please call us sooner if your blood sugar goes below 70, or if you have a lot of readings over 200.   Please come back for a follow-up appointment in 3 months.

## 2015-03-30 ENCOUNTER — Other Ambulatory Visit: Payer: Self-pay | Admitting: Endocrinology

## 2015-04-03 ENCOUNTER — Telehealth: Payer: Self-pay | Admitting: Endocrinology

## 2015-04-03 MED ORDER — INSULIN NPH ISOPHANE & REGULAR (70-30) 100 UNIT/ML ~~LOC~~ SUSP: SUBCUTANEOUS | Status: DC

## 2015-04-03 NOTE — Telephone Encounter (Signed)
Left voicemail advising the pt rx for Novolin 70/30 has been resubmitted. Requested call back if the pt would like to discuss.

## 2015-04-03 NOTE — Telephone Encounter (Signed)
Pt states the pharmacy did not receive the refill for the insulin on 8/11

## 2015-04-26 LAB — HM DIABETES EYE EXAM

## 2015-04-27 ENCOUNTER — Encounter: Payer: Self-pay | Admitting: Endocrinology

## 2015-06-13 ENCOUNTER — Other Ambulatory Visit: Payer: Self-pay | Admitting: Endocrinology

## 2015-06-14 ENCOUNTER — Telehealth: Payer: Self-pay | Admitting: Endocrinology

## 2015-06-14 MED ORDER — INSULIN NPH ISOPHANE & REGULAR (70-30) 100 UNIT/ML ~~LOC~~ SUSP: SUBCUTANEOUS | Status: DC

## 2015-06-14 NOTE — Telephone Encounter (Signed)
Rx sent per pt's request.  

## 2015-06-14 NOTE — Telephone Encounter (Signed)
Pt needs novolin called in to [facility] please

## 2015-09-29 ENCOUNTER — Ambulatory Visit (INDEPENDENT_AMBULATORY_CARE_PROVIDER_SITE_OTHER): Payer: BLUE CROSS/BLUE SHIELD | Admitting: Endocrinology

## 2015-09-29 ENCOUNTER — Encounter: Payer: Self-pay | Admitting: Endocrinology

## 2015-09-29 VITALS — BP 122/64 | HR 91 | Temp 98.1°F | Ht 65.0 in | Wt 176.0 lb

## 2015-09-29 DIAGNOSIS — E119 Type 2 diabetes mellitus without complications: Secondary | ICD-10-CM | POA: Diagnosis not present

## 2015-09-29 LAB — POCT GLYCOSYLATED HEMOGLOBIN (HGB A1C): HEMOGLOBIN A1C: 9.9

## 2015-09-29 MED ORDER — LOSARTAN POTASSIUM 25 MG PO TABS: 25.0000 mg | ORAL_TABLET | Freq: Every day | ORAL | Status: DC

## 2015-09-29 MED ORDER — GLUCOSE BLOOD VI STRP: 1.0000 | ORAL_STRIP | Freq: Two times a day (BID) | Status: DC

## 2015-09-29 MED ORDER — INSULIN NPH ISOPHANE & REGULAR (70-30) 100 UNIT/ML ~~LOC~~ SUSP: SUBCUTANEOUS | Status: DC

## 2015-09-29 MED ORDER — CONTOUR NEXT ONE KIT: 1.0000 | PACK | Status: DC

## 2015-09-29 NOTE — Progress Notes (Signed)
Subjective:    Patient ID: Albert Reed, male    DOB: September 11, 1950, 65 y.o.   MRN: [id number]  HPI Pt returns for f/u of diabetes mellitus: DM type: Insulin-requiring type 2 Dx'ed: 0000000 Complications: none Therapy: insulin since 2013 DKA: never Severe hypoglycemia: never Pancreatitis: never Other: he has chosen a simple, inexpensive insulin regimen.  Interval history: He usually takes 40 qam and 20 qpm.  He says he never misses the insulin.  he brings a record of his cbg's which i have reviewed today (he checks in am only).  It varies from 69-200's.  He says he skips the am insulin if am cbg is low. Past Medical History  Diagnosis Date  . HYPERTHYROIDISM 11/09/2009  . DIVERTICULOSIS, COLON 04/01/2007  . COLONIC POLYPS, HX OF 04/01/2007  . HYPERTENSION 04/01/2007  . HYPERCHOLESTEROLEMIA 07/28/2008  . LEUKOPENIA, CHRONIC 07/28/2008  . DM 11/24/2008    Past Surgical History  Procedure Laterality Date  . Bunionectomy      Social History   Social History  . Marital Status: Married    Spouse Name: N/A  . Number of Children: N/A  . Years of Education: N/A   Occupational History  . 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 Smoking status: Never Smoker   . Smokeless tobacco: Never Used  . Alcohol Use: No  . Drug Use: No  . Sexual Activity: Not on file   Other Topics Concern  . Not on file   Social History Narrative   Originally from [place] (in [place] since 1996)    Current Outpatient Prescriptions on File Prior to Visit  Medication Sig Dispense Refill  . aspirin 81 MG tablet Take 81 mg by mouth daily.      . Insulin Syringe-Needle U-100 (BD INSULIN SYRINGE ULTRAFINE) 31G X 5/16" 0.5 ML MISC 1 each by Does not apply route daily. 100 each 1  . Multiple Vitamins-Minerals (CENTRUM SILVER ULTRA MENS) TABS Take 1 tablet by mouth daily.       No current facility-administered medications on file prior to visit.    No Known Allergies  Family  History  Problem Relation Age of Onset  . Diabetes Brother     Type 2  . Colon cancer Neg Hx     patient dose not know much about family    BP 122/64 mmHg  Pulse 91  Temp(Src) 98.1 F (36.7 C) (Oral)  Ht 5\' 5"  (1.651 m)  Wt 176 lb (79.833 kg)  BMI 29.29 kg/m2  SpO2 98%  Review of Systems Denies LOC    Objective:   Physical Exam VITAL SIGNS:  See vs page GENERAL: no distress Pulses: dorsalis pedis intact bilat.   MSK: no deformity of the feet CV: no leg edema Skin:  no ulcer on the feet.  normal color and temp on the feet. Neuro: sensation is intact to touch on the feet.     A1c=9.9%    Assessment & Plan:  DM: glycemic control is worse Noncompliance with cbg recording and insulin dosing: I'll work around this as best I can.   Patient is advised the following: Patient Instructions  Because of the pattern of your blood sugar (higher later in the day), it is important to take 50 units with breakfast, and 10 units with the evening meal.   check your blood sugar twice a day.  vary the time of day when you check, between before the 3 meals,  and at bedtime.  also check if you have symptoms of your blood sugar being too high or too low.  please keep a record of the readings and bring it to your next appointment here.  You can write it on any piece of paper.  please call us sooner if your blood sugar goes below 70, or if you have a lot of readings over 200.   Please come back for a regular physical appointment in 2 months.   i have sent a prescription to your pharmacy, to protect the kidneys. i have also sent a prescription to your pharmacy, for a new meter and strips.

## 2015-09-29 NOTE — Patient Instructions (Addendum)
Because of the pattern of your blood sugar (higher later in the day), it is important to take 50 units with breakfast, and 10 units with the evening meal.   check your blood sugar twice a day.  vary the time of day when you check, between before the 3 meals, and at bedtime.  also check if you have symptoms of your blood sugar being too high or too low.  please keep a record of the readings and bring it to your next appointment here.  You can write it on any piece of paper.  please call us sooner if your blood sugar goes below 70, or if you have a lot of readings over 200.   Please come back for a regular physical appointment in 2 months.   i have sent a prescription to your pharmacy, to protect the kidneys. i have also sent a prescription to your pharmacy, for a new meter and strips.

## 2015-12-08 ENCOUNTER — Encounter: Payer: Self-pay | Admitting: Endocrinology

## 2015-12-08 ENCOUNTER — Ambulatory Visit (INDEPENDENT_AMBULATORY_CARE_PROVIDER_SITE_OTHER): Payer: BLUE CROSS/BLUE SHIELD | Admitting: Endocrinology

## 2015-12-08 VITALS — BP 124/64 | HR 91 | Temp 98.9°F | Ht 65.0 in | Wt 176.0 lb

## 2015-12-08 DIAGNOSIS — E119 Type 2 diabetes mellitus without complications: Secondary | ICD-10-CM | POA: Diagnosis not present

## 2015-12-08 DIAGNOSIS — Z125 Encounter for screening for malignant neoplasm of prostate: Secondary | ICD-10-CM | POA: Diagnosis not present

## 2015-12-08 DIAGNOSIS — Z794 Long term (current) use of insulin: Secondary | ICD-10-CM | POA: Diagnosis not present

## 2015-12-08 DIAGNOSIS — Z0000 Encounter for general adult medical examination without abnormal findings: Secondary | ICD-10-CM

## 2015-12-08 LAB — HEPATIC FUNCTION PANEL
ALK PHOS: 100 U/L (ref 39–117)
ALT: 19 U/L (ref 0–53)
AST: 12 U/L (ref 0–37)
Albumin: 4 g/dL (ref 3.5–5.2)
BILIRUBIN DIRECT: 0.1 mg/dL (ref 0.0–0.3)
BILIRUBIN TOTAL: 0.4 mg/dL (ref 0.2–1.2)
TOTAL PROTEIN: 6.6 g/dL (ref 6.0–8.3)

## 2015-12-08 LAB — LDL CHOLESTEROL, DIRECT: Direct LDL: 82 mg/dL

## 2015-12-08 LAB — CBC WITH DIFFERENTIAL/PLATELET
BASOS PCT: 0.5 % (ref 0.0–3.0)
Basophils Absolute: 0 10*3/uL (ref 0.0–0.1)
Eosinophils Absolute: 0.1 10*3/uL (ref 0.0–0.7)
Eosinophils Relative: 1.8 % (ref 0.0–5.0)
HEMATOCRIT: 41.6 % (ref 39.0–52.0)
Hemoglobin: 14 g/dL (ref 13.0–17.0)
LYMPHS ABS: 1.5 10*3/uL (ref 0.7–4.0)
LYMPHS PCT: 32.8 % (ref 12.0–46.0)
MCHC: 33.6 g/dL (ref 30.0–36.0)
MCV: 87.7 fl (ref 78.0–100.0)
MONOS PCT: 11.4 % (ref 3.0–12.0)
Monocytes Absolute: 0.5 10*3/uL (ref 0.1–1.0)
NEUTROS ABS: 2.5 10*3/uL (ref 1.4–7.7)
NEUTROS PCT: 53.5 % (ref 43.0–77.0)
PLATELETS: 196 10*3/uL (ref 150.0–400.0)
RBC: 4.75 Mil/uL (ref 4.22–5.81)
RDW: 15.2 % (ref 11.5–15.5)
WBC: 4.6 10*3/uL (ref 4.0–10.5)

## 2015-12-08 LAB — URINALYSIS, ROUTINE W REFLEX MICROSCOPIC
Bilirubin Urine: NEGATIVE
HGB URINE DIPSTICK: NEGATIVE
Ketones, ur: NEGATIVE
Leukocytes, UA: NEGATIVE
Nitrite: NEGATIVE
PH: 5 (ref 5.0–8.0)
RBC / HPF: NONE SEEN (ref 0–?)
SPECIFIC GRAVITY, URINE: 1.025 (ref 1.000–1.030)
TOTAL PROTEIN, URINE-UPE24: NEGATIVE
URINE GLUCOSE: 250 — AB
Urobilinogen, UA: 0.2 (ref 0.0–1.0)
WBC, UA: NONE SEEN (ref 0–?)

## 2015-12-08 LAB — LIPID PANEL
CHOL/HDL RATIO: 4
Cholesterol: 146 mg/dL (ref 0–200)
HDL: 33.8 mg/dL — AB (ref >39.00)
NONHDL: 111.89
Triglycerides: 259 mg/dL — ABNORMAL HIGH (ref 0.0–149.0)
VLDL: 51.8 mg/dL — AB (ref 0.0–40.0)

## 2015-12-08 LAB — BASIC METABOLIC PANEL
BUN: 18 mg/dL (ref 6–23)
CO2: 30 mEq/L (ref 19–32)
CREATININE: 1.12 mg/dL (ref 0.40–1.50)
Calcium: 9.3 mg/dL (ref 8.4–10.5)
Chloride: 99 mEq/L (ref 96–112)
GFR: 84.77 mL/min (ref >60.00)
GLUCOSE: 244 mg/dL — AB (ref 70–99)
POTASSIUM: 3.9 meq/L (ref 3.5–5.1)
Sodium: 138 mEq/L (ref 135–145)

## 2015-12-08 LAB — POCT GLYCOSYLATED HEMOGLOBIN (HGB A1C): HEMOGLOBIN A1C: 10.2

## 2015-12-08 LAB — MICROALBUMIN / CREATININE URINE RATIO
Creatinine,U: 138.9 mg/dL
MICROALB UR: 1 mg/dL (ref 0.0–1.9)
Microalb Creat Ratio: 0.7 mg/g (ref 0.0–30.0)

## 2015-12-08 LAB — PSA: PSA: 0.34 ng/mL (ref 0.10–4.00)

## 2015-12-08 LAB — TSH: TSH: 0.95 u[IU]/mL (ref 0.35–4.50)

## 2015-12-08 MED ORDER — INSULIN NPH ISOPHANE & REGULAR (70-30) 100 UNIT/ML ~~LOC~~ SUSP: SUBCUTANEOUS | Status: DC

## 2015-12-08 NOTE — Progress Notes (Signed)
Subjective:    Patient ID: Albert Reed, male    DOB: Dec 27, 1950, 65 y.o.   MRN: [id number]  HPI Pt returns for f/u of diabetes mellitus: DM type: Insulin-requiring type 2 Dx'ed: 5643 Complications: none Therapy: insulin since 2013 DKA: never.  Severe hypoglycemia: never.  Pancreatitis: never.  Other: he has chosen a simple, inexpensive insulin regimen.  Interval history: He usually takes 45 qam and 20 qpm.  He says he seldom misses the insulin.  no cbg record, but states cbg's are in the 100's. He just finished taking cipro (bought in [place] a approx 10 days ago) for UTI.  He says last UTI was in 2011.   Past Medical History  Diagnosis Date  . HYPERTHYROIDISM 11/09/2009  . DIVERTICULOSIS, COLON 04/01/2007  . COLONIC POLYPS, HX OF 04/01/2007  . HYPERTENSION 04/01/2007  . HYPERCHOLESTEROLEMIA 07/28/2008  . LEUKOPENIA, CHRONIC 07/28/2008  . DM 11/24/2008    Past Surgical History  Procedure Laterality Date  . Bunionectomy      Social History   Social History  . Marital Status: Married    Spouse Name: N/A  . Number of Children: N/A  . Years of Education: N/A   Occupational History  . 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 Smoking status: Never Smoker   . Smokeless tobacco: Never Used  . Alcohol Use: No  . Drug Use: No  . Sexual Activity: Not on file   Other Topics Concern  . Not on file   Social History Narrative   Originally from [place] (in [place] since [place])    Current Outpatient Prescriptions on File Prior to Visit  Medication Sig Dispense Refill  . aspirin 81 MG tablet Take 81 mg by mouth daily.      . Blood Glucose Monitoring Suppl (CONTOUR NEXT ONE) KIT 1 Device by Does not apply route once a week. 1 kit 0  . glucose blood (BAYER CONTOUR NEXT TEST) test strip 1 each by Other route 2 (two) times daily with a meal. And lancets 2/day 100 each 12  . Insulin Syringe-Needle U-100 (BD INSULIN SYRINGE ULTRAFINE) 31G X 5/16" 0.5 ML MISC  1 each by Does not apply route daily. 100 each 1  . losartan (COZAAR) 25 MG tablet Take 1 tablet (25 mg total) by mouth daily. 90 tablet 3  . Multiple Vitamins-Minerals (CENTRUM SILVER ULTRA MENS) TABS Take 1 tablet by mouth daily.       No current facility-administered medications on file prior to visit.    No Known Allergies  Family History  Problem Relation Age of Onset  . Diabetes Brother     Type 2  . Colon cancer Neg Hx     patient dose not know much about family    BP 124/64 mmHg  Pulse 91  Temp(Src) 98.9 F (37.2 C) (Oral)  Ht '5\' 5"'$  (1.651 m)  Wt 176 lb (79.833 kg)  BMI 29.29 kg/m2  SpO2 98%  Review of Systems Denies fever.      Objective:   Physical Exam VITAL SIGNS:  See vs page GENERAL: no distress Pulses: dorsalis pedis intact bilat.   MSK: no deformity of the feet CV: no leg edema Skin:  no ulcer on the feet.  normal color and temp on the feet. Neuro: sensation is intact to touch on the feet.      A1c=10.2% UA: normal except for glucose    Assessment & Plan:  DM:  worse. a1c is much worse than would be suggested by reported cbg's.  UTI: better: no further rx is needed HTN: well-controlled.  Please continue the same medication  Patient is advised the following: Patient Instructions  Please come back for a regular physical appointment in 3 months.  check your blood sugar twice a day.  vary the time of day when you check, between before the 3 meals, and at bedtime.  also check if you have symptoms of your blood sugar being too high or too low.  please keep a record of the readings and bring it to your next appointment here (or you can bring the meter itself).  You can write it on any piece of paper.  please call us sooner if your blood sugar goes below 70, or if you have a lot of readings over 200. Blood and urine tests are requested for you today.  We'll let you know about the results.

## 2015-12-08 NOTE — Patient Instructions (Addendum)
Please come back for a regular physical appointment in 3 months.  check your blood sugar twice a day.  vary the time of day when you check, between before the 3 meals, and at bedtime.  also check if you have symptoms of your blood sugar being too high or too low.  please keep a record of the readings and bring it to your next appointment here (or you can bring the meter itself).  You can write it on any piece of paper.  please call us sooner if your blood sugar goes below 70, or if you have a lot of readings over 200. Blood and urine tests are requested for you today.  We'll let you know about the results.

## 2015-12-09 DIAGNOSIS — E119 Type 2 diabetes mellitus without complications: Secondary | ICD-10-CM | POA: Insufficient documentation

## 2015-12-18 ENCOUNTER — Ambulatory Visit: Payer: BLUE CROSS/BLUE SHIELD | Admitting: Endocrinology

## 2015-12-26 ENCOUNTER — Telehealth: Payer: Self-pay | Admitting: Endocrinology

## 2015-12-26 MED ORDER — GLUCOSE BLOOD VI STRP: 1.0000 | ORAL_STRIP | Freq: Two times a day (BID) | Status: DC

## 2015-12-26 NOTE — Telephone Encounter (Signed)
Rx submitted per pt's request.  

## 2015-12-26 NOTE — Telephone Encounter (Signed)
PT said he needs his test strips sent into the [facility] on [facility]

## 2016-03-08 ENCOUNTER — Ambulatory Visit (INDEPENDENT_AMBULATORY_CARE_PROVIDER_SITE_OTHER): Payer: Managed Care, Other (non HMO) | Admitting: Endocrinology

## 2016-03-08 ENCOUNTER — Encounter: Payer: Self-pay | Admitting: Endocrinology

## 2016-03-08 VITALS — BP 126/78 | HR 89 | Temp 98.1°F | Ht 64.5 in | Wt 178.0 lb

## 2016-03-08 DIAGNOSIS — Z23 Encounter for immunization: Secondary | ICD-10-CM | POA: Diagnosis not present

## 2016-03-08 DIAGNOSIS — I10 Essential (primary) hypertension: Secondary | ICD-10-CM | POA: Diagnosis not present

## 2016-03-08 DIAGNOSIS — E119 Type 2 diabetes mellitus without complications: Secondary | ICD-10-CM | POA: Diagnosis not present

## 2016-03-08 DIAGNOSIS — Z794 Long term (current) use of insulin: Secondary | ICD-10-CM

## 2016-03-08 DIAGNOSIS — D72819 Decreased white blood cell count, unspecified: Secondary | ICD-10-CM | POA: Diagnosis not present

## 2016-03-08 DIAGNOSIS — Z125 Encounter for screening for malignant neoplasm of prostate: Secondary | ICD-10-CM | POA: Diagnosis not present

## 2016-03-08 DIAGNOSIS — Z0000 Encounter for general adult medical examination without abnormal findings: Secondary | ICD-10-CM | POA: Diagnosis not present

## 2016-03-08 LAB — CBC WITH DIFFERENTIAL/PLATELET
BASOS PCT: 0.3 % (ref 0.0–3.0)
Basophils Absolute: 0 10*3/uL (ref 0.0–0.1)
EOS PCT: 1.1 % (ref 0.0–5.0)
Eosinophils Absolute: 0 10*3/uL (ref 0.0–0.7)
HCT: 40.6 % (ref 39.0–52.0)
HEMOGLOBIN: 13.6 g/dL (ref 13.0–17.0)
Lymphocytes Relative: 28.8 % (ref 12.0–46.0)
Lymphs Abs: 1.2 10*3/uL (ref 0.7–4.0)
MCHC: 33.5 g/dL (ref 30.0–36.0)
MCV: 87.3 fl (ref 78.0–100.0)
MONOS PCT: 9.8 % (ref 3.0–12.0)
Monocytes Absolute: 0.4 10*3/uL (ref 0.1–1.0)
Neutro Abs: 2.5 10*3/uL (ref 1.4–7.7)
Neutrophils Relative %: 60 % (ref 43.0–77.0)
Platelets: 172 10*3/uL (ref 150.0–400.0)
RBC: 4.66 Mil/uL (ref 4.22–5.81)
RDW: 15.1 % (ref 11.5–15.5)
WBC: 4.1 10*3/uL (ref 4.0–10.5)

## 2016-03-08 LAB — BASIC METABOLIC PANEL
BUN: 16 mg/dL (ref 6–23)
CHLORIDE: 97 meq/L (ref 96–112)
CO2: 30 meq/L (ref 19–32)
Calcium: 9.2 mg/dL (ref 8.4–10.5)
Creatinine, Ser: 1.13 mg/dL (ref 0.40–1.50)
GFR: 83.84 mL/min (ref >60.00)
Glucose, Bld: 286 mg/dL — ABNORMAL HIGH (ref 70–99)
POTASSIUM: 3.9 meq/L (ref 3.5–5.1)
Sodium: 135 mEq/L (ref 135–145)

## 2016-03-08 LAB — MICROALBUMIN / CREATININE URINE RATIO
CREATININE, U: 59.2 mg/dL
Microalb Creat Ratio: 1.2 mg/g (ref 0.0–30.0)
Microalb, Ur: <0.7 mg/dL (ref 0.0–1.9)

## 2016-03-08 LAB — URINALYSIS, ROUTINE W REFLEX MICROSCOPIC
Bilirubin Urine: NEGATIVE
HGB URINE DIPSTICK: NEGATIVE
Ketones, ur: NEGATIVE
Leukocytes, UA: NEGATIVE
NITRITE: NEGATIVE
RBC / HPF: NONE SEEN (ref 0–?)
Specific Gravity, Urine: <=1.005 — AB (ref 1.000–1.030)
Total Protein, Urine: NEGATIVE
Urine Glucose: >=1000 — AB
Urobilinogen, UA: 0.2 (ref 0.0–1.0)
WBC UA: NONE SEEN (ref 0–?)
pH: 7 (ref 5.0–8.0)

## 2016-03-08 LAB — LIPID PANEL
CHOLESTEROL: 147 mg/dL (ref 0–200)
HDL: 32.5 mg/dL — AB (ref >39.00)
NonHDL: 114.2
TRIGLYCERIDES: 299 mg/dL — AB (ref 0.0–149.0)
Total CHOL/HDL Ratio: 5
VLDL: 59.8 mg/dL — AB (ref 0.0–40.0)

## 2016-03-08 LAB — POCT GLYCOSYLATED HEMOGLOBIN (HGB A1C): Hemoglobin A1C: 9.6

## 2016-03-08 LAB — HEPATIC FUNCTION PANEL
ALBUMIN: 4.1 g/dL (ref 3.5–5.2)
ALK PHOS: 90 U/L (ref 39–117)
ALT: 21 U/L (ref 0–53)
AST: 16 U/L (ref 0–37)
Bilirubin, Direct: 0.1 mg/dL (ref 0.0–0.3)
Total Bilirubin: 0.4 mg/dL (ref 0.2–1.2)
Total Protein: 6.8 g/dL (ref 6.0–8.3)

## 2016-03-08 LAB — LDL CHOLESTEROL, DIRECT: LDL DIRECT: 86 mg/dL

## 2016-03-08 LAB — PSA: PSA: 0.28 ng/mL (ref 0.10–4.00)

## 2016-03-08 LAB — TSH: TSH: 1.32 u[IU]/mL (ref 0.35–4.50)

## 2016-03-08 MED ORDER — INSULIN GLARGINE 100 UNIT/ML ~~LOC~~ SOLN: 80.0000 [IU] | Freq: Every day | SUBCUTANEOUS | Status: DC

## 2016-03-08 MED ORDER — SILDENAFIL CITRATE 20 MG PO TABS
ORAL_TABLET | ORAL | Status: DC
Start: 2016-03-08 — End: 2017-05-08

## 2016-03-08 NOTE — Progress Notes (Signed)
Pre visit review using our clinic review tool, if applicable. No additional management support is needed unless otherwise documented below in the visit note. 

## 2016-03-08 NOTE — Progress Notes (Signed)
 Subjective:    Patient ID: Albert Reed, male    DOB: 01/09/1951, 65 y.o.   MRN: [id number]  HPI Pt is here for regular wellness examination, and is feeling pretty well in general, and says chronic med probs are stable, except as noted below Past Medical History  Diagnosis Date  . HYPERTHYROIDISM 11/09/2009  . DIVERTICULOSIS, COLON 04/01/2007  . COLONIC POLYPS, HX OF 04/01/2007  . HYPERTENSION 04/01/2007  . HYPERCHOLESTEROLEMIA 07/28/2008  . LEUKOPENIA, CHRONIC 07/28/2008  . DM 11/24/2008    Past Surgical History  Procedure Laterality Date  . Bunionectomy      Social History   Social History  . Marital Status: Married    Spouse Name: N/A  . Number of Children: N/A  . Years of Education: N/A   Occupational History  . 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 Smoking status: Never Smoker   . Smokeless tobacco: Never Used  . Alcohol Use: No  . Drug Use: No  . Sexual Activity: Not on file   Other Topics Concern  . Not on file   Social History Narrative   Originally from [place]    Current Outpatient Prescriptions on File Prior to Visit  Medication Sig Dispense Refill  . aspirin 81 MG tablet Take 81 mg by mouth daily.      . Blood Glucose Monitoring Suppl (CONTOUR NEXT ONE) KIT 1 Device by Does not apply route once a week. 1 kit 0  . glucose blood (BAYER CONTOUR NEXT TEST) test strip 1 each by Other route 2 (two) times daily with a meal. And lancets 2/day 100 each 12  . Insulin Syringe-Needle U-100 (BD INSULIN SYRINGE ULTRAFINE) 31G X 5/16" 0.5 ML MISC 1 each by Does not apply route daily. 100 each 1  . losartan (COZAAR) 25 MG tablet Take 1 tablet (25 mg total) by mouth daily. 90 tablet 3  . Multiple Vitamins-Minerals (CENTRUM SILVER ULTRA MENS) TABS Take 1 tablet by mouth daily.       No current facility-administered medications on file prior to visit.    No Known Allergies  Family History  Problem Relation Age of  Onset  . Diabetes Brother     Type 2  . Colon cancer Neg Hx     patient dose not know much about family    BP 126/78 mmHg  Pulse 89  Temp(Src) 98.1 F (36.7 C) (Oral)  Ht 5' 4.5" (1.638 m)  Wt 178 lb (80.74 kg)  BMI 30.09 kg/m2  SpO2 98%  Review of Systems  Constitutional: Negative for fever and unexpected weight change.  HENT: Negative for hearing loss.   Eyes: Negative for visual disturbance.  Respiratory: Negative for shortness of breath.   Cardiovascular: Negative for chest pain.  Gastrointestinal: Negative for anal bleeding.  Endocrine: Negative for cold intolerance.  Genitourinary: Negative for hematuria and difficulty urinating.  Musculoskeletal: Negative for back pain.  Skin: Negative for rash.  Allergic/Immunologic: Negative for environmental allergies.  Neurological: Negative for numbness.  Psychiatric/Behavioral: Negative for dysphoric mood.       Objective:   Physical Exam VS: see vs page GEN: no distress HEAD: head: no deformity eyes: no periorbital swelling, no proptosis external nose and ears are normal mouth: no lesion seen NECK: supple, thyroid is not enlarged CHEST WALL: no deformity LUNGS: clear to auscultation BREASTS:  No gynecomastia CV: reg rate and rhythm, no murmur ABD: abdomen is   soft, nontender.  no hepatosplenomegaly.  not distended.  no hernia GENITALIA:  Normal male.   RECTAL: normal external and internal exam.  heme neg. PROSTATE:  Normal size.  No nodule MUSCULOSKELETAL: muscle bulk and strength are grossly normal.  no obvious joint swelling.  gait is normal and steady PULSES: no carotid bruit NEURO:  cn 2-12 grossly intact.   readily moves all 4's.  SKIN:  Normal texture and temperature.  No rash or suspicious lesion is visible.   NODES:  None palpable at the neck PSYCH: alert, well-oriented.  Does not appear anxious nor depressed.      Assessment & Plan:  Wellness visit today, with problems stable, except as  noted.   SEPARATE EVALUATION FOLLOWS--EACH PROBLEM HERE IS NEW, NOT RESPONDING TO TREATMENT, OR POSES SIGNIFICANT RISK TO THE PATIENT'S HEALTH: HISTORY OF THE PRESENT ILLNESS: Pt returns for f/u of diabetes mellitus: DM type: Insulin-requiring type 2 Dx'ed: 1997 Complications: none Therapy: insulin since 2013 DKA: never.  Severe hypoglycemia: never.  Pancreatitis: never.  Other: he has chosen a simple, inexpensive insulin regimen.  Interval history: no cbg record, but states cbg's ae says cbg's are extremely variable.  PAST MEDICAL HISTORY reviewed and up to date today REVIEW OF SYSTEMS: He denies hypoglycemia.  He has ED sxs. PHYSICAL EXAMINATION: VITAL SIGNS:  See vs page GENERAL: no distress Pulses: dorsalis pedis intact bilat.   MSK: no deformity of the feet CV: no leg edema Skin:  no ulcer on the feet.  normal color and temp on the feet. Neuro: sensation is intact to touch on the feet.  LAB/XRAY RESULTS: A1c=9.6% i personally reviewed electrocardiogram tracing (today):  Indication: DM Impression: repolarization abnormalities, which have been intermittently noted in the past.   IMPRESSION: Insulin-requiring type 2 DM: ongoing poor control.  He needs further simplification of his regimen.   ED: new PLAN:  I have sent a prescription to your pharmacy, to change to once-a-day insulin, and for the ED symptoms.  ELLISON, SEAN, MD 

## 2016-03-08 NOTE — Progress Notes (Signed)
we discussed code status.  pt requests full code, but would not want to be started or maintained on artificial life-support measures if there was not a reasonable chance of recovery 

## 2016-03-08 NOTE — Patient Instructions (Addendum)
I have sent a prescription to your pharmacy, to change to once-a-day insulin, and for the ED symptoms.  blood tests are requested for you today.  We'll let you know about the results.  Please consider these measures for your health:  minimize alcohol.  Do not use tobacco products.  Have a colonoscopy at least every 10 years from age 65.  Keep firearms safely stored.  Always use seat belts.  have working smoke alarms in your home.  See an eye doctor and dentist regularly.   It is critically important to prevent falling down (keep floor areas well-lit, dry, and free of loose objects.  If you have a cane, walker, or wheelchair, you should use it, even for short trips around the house.  Wear flat-soled shoes.  Also, try not to rush) Please come back for a follow-up appointment in 3 months

## 2016-03-09 LAB — HIV ANTIBODY (ROUTINE TESTING W REFLEX): HIV 1&2 Ab, 4th Generation: NONREACTIVE

## 2016-03-09 LAB — HEPATITIS C ANTIBODY: HCV AB: NEGATIVE

## 2016-04-18 ENCOUNTER — Telehealth: Payer: Self-pay | Admitting: Endocrinology

## 2016-04-18 MED ORDER — GLUCOSE BLOOD VI STRP: ORAL_STRIP | 2 refills | Status: DC

## 2016-04-18 NOTE — Telephone Encounter (Signed)
Prescription submitted.

## 2016-04-18 NOTE — Telephone Encounter (Signed)
Patient stated he would like to take the One Touch Ultra Blue,  Because the glucose blood (BAYER CONTOUR NEXT TEST) test strip is to expensive can't afford it [place], [place] - [address] [phone] (Phone) [phone] (Fax)

## 2016-04-19 ENCOUNTER — Other Ambulatory Visit: Payer: Self-pay | Admitting: Endocrinology

## 2016-05-15 LAB — HM DIABETES EYE EXAM

## 2016-05-17 ENCOUNTER — Encounter: Payer: Self-pay | Admitting: Endocrinology

## 2016-06-07 ENCOUNTER — Ambulatory Visit: Payer: Managed Care, Other (non HMO) | Admitting: Endocrinology

## 2016-06-26 ENCOUNTER — Ambulatory Visit (INDEPENDENT_AMBULATORY_CARE_PROVIDER_SITE_OTHER): Payer: PPO | Admitting: Endocrinology

## 2016-06-26 ENCOUNTER — Encounter: Payer: Self-pay | Admitting: Endocrinology

## 2016-06-26 VITALS — BP 138/70 | HR 95 | Wt 184.0 lb

## 2016-06-26 DIAGNOSIS — E119 Type 2 diabetes mellitus without complications: Secondary | ICD-10-CM | POA: Diagnosis not present

## 2016-06-26 DIAGNOSIS — Z794 Long term (current) use of insulin: Secondary | ICD-10-CM | POA: Diagnosis not present

## 2016-06-26 LAB — POCT GLYCOSYLATED HEMOGLOBIN (HGB A1C): HEMOGLOBIN A1C: 9.3

## 2016-06-26 MED ORDER — INSULIN NPH ISOPHANE & REGULAR (70-30) 100 UNIT/ML ~~LOC~~ SUSP: SUBCUTANEOUS | 11 refills | Status: DC

## 2016-06-26 NOTE — Progress Notes (Signed)
Subjective:    Patient ID: Albert Reed, male    DOB: Dec 18, 1950, 65 y.o.   MRN: [id number]  HPI Pt returns for f/u of diabetes mellitus: DM type: Insulin-requiring type 2 Dx'ed: 0000000 Complications: none Therapy: insulin since 2013 DKA: never.  Severe hypoglycemia: never.  Pancreatitis: never.  Other: he takes human insulin, due to cost.  Interval history: no cbg record, but states cbg's are in the 200's.   There is no trend throughout the day.  He takes 70/30 insulin 50 units qam and 30 units qpm.  He says he never misses the insulin.  He did not take lantus, due to cost.   He also cannot afford losartan.   Past Medical History:  Diagnosis Date  . COLONIC POLYPS, HX OF 04/01/2007  . DIVERTICULOSIS, COLON 04/01/2007  . DM 11/24/2008  . HYPERCHOLESTEROLEMIA 07/28/2008  . HYPERTENSION 04/01/2007  . HYPERTHYROIDISM 11/09/2009  . LEUKOPENIA, CHRONIC 07/28/2008    Past Surgical History:  Procedure Laterality Date  . BUNIONECTOMY      Social History   Social History  . Marital status: Married    Spouse name: N/A  . Number of children: N/A  . Years of education: N/A   Occupational History  . [employer]    Group Home   Social History Main Topics  . Smoking status: Never Smoker  . Smokeless tobacco: Never Used  . Alcohol use No  . Drug use: No  . Sexual activity: Not on file   Other Topics Concern  . Not on file   Social History Narrative   Originally from [place] (in [place] since [place]    Current Outpatient Prescriptions on File Prior to Visit  Medication Sig Dispense Refill  . aspirin 81 MG tablet Take 81 mg by mouth daily.      Marland Kitchen glucose blood (ONE TOUCH ULTRA TEST) test strip Use to check blood sugar 2 times per day. 100 each 2  . Insulin Syringe-Needle U-100 (BD INSULIN SYRINGE ULTRAFINE) 31G X 5/16" 0.5 ML MISC 1 each by Does not apply route daily. 100 each 1  . Multiple Vitamins-Minerals (CENTRUM SILVER ULTRA MENS) TABS Take 1 tablet by  mouth daily.      . ONE TOUCH ULTRA TEST test strip USE ONE STRIP TO CHECK GLUCOSE TWICE DAILY 100 each 12  . sildenafil (REVATIO) 20 MG tablet 1-5 tabs, as needed for ED symptoms 50 tablet 11   No current facility-administered medications on file prior to visit.     No Known Allergies  Family History  Problem Relation Age of Onset  . Diabetes Brother     Type 2  . Colon cancer Neg Hx     patient dose not know much about family    BP 138/70   Pulse 95   Wt 184 lb (83.5 kg)   SpO2 95%   BMI 31.10 kg/m    Review of Systems He denies hypoglycemia.      Objective:   Physical Exam VITAL SIGNS:  See vs page GENERAL: no distress Pulses: dorsalis pedis intact bilat.   MSK: no deformity of the feet CV: no leg edema Skin:  no ulcer on the feet.  normal color and temp on the feet. Neuro: sensation is intact to touch on the feet.     A1c=9.3%     Assessment & Plan:  Insulin-requiring type 2 DM: he needs increased rx.    Patient is advised the following: Patient Instructions  check  your blood sugar twice day.  vary the time of day when you check, between before the 3 meals, and at bedtime.  also check if you have symptoms of your blood sugar being too high or too low.  please keep a record of the readings and bring it to your next appointment here (or you can bring the meter itself).  You can write it on any piece of paper.  please call us sooner if your blood sugar goes below 70, or if you have a lot of readings over 200. Please increase the 70/30 insulin to 60 units with breakfast, and 40 units with the evening meal.   It is OK to stay off the losartan for now Please come back for a follow-up appointment in 1 month.

## 2016-06-26 NOTE — Patient Instructions (Addendum)
check your blood sugar twice day.  vary the time of day when you check, between before the 3 meals, and at bedtime.  also check if you have symptoms of your blood sugar being too high or too low.  please keep a record of the readings and bring it to your next appointment here (or you can bring the meter itself).  You can write it on any piece of paper.  please call us sooner if your blood sugar goes below 70, or if you have a lot of readings over 200. Please increase the 70/30 insulin to 60 units with breakfast, and 40 units with the evening meal.   It is OK to stay off the losartan for now Please come back for a follow-up appointment in 1 month.

## 2016-07-24 ENCOUNTER — Encounter: Payer: Self-pay | Admitting: Endocrinology

## 2016-07-24 ENCOUNTER — Ambulatory Visit (INDEPENDENT_AMBULATORY_CARE_PROVIDER_SITE_OTHER): Payer: PPO | Admitting: Endocrinology

## 2016-07-24 VITALS — BP 155/71 | HR 89 | Wt 187.0 lb

## 2016-07-24 DIAGNOSIS — Z794 Long term (current) use of insulin: Secondary | ICD-10-CM | POA: Diagnosis not present

## 2016-07-24 DIAGNOSIS — I10 Essential (primary) hypertension: Secondary | ICD-10-CM | POA: Diagnosis not present

## 2016-07-24 DIAGNOSIS — Z0001 Encounter for general adult medical examination with abnormal findings: Secondary | ICD-10-CM

## 2016-07-24 DIAGNOSIS — E119 Type 2 diabetes mellitus without complications: Secondary | ICD-10-CM

## 2016-07-24 MED ORDER — LOSARTAN POTASSIUM 50 MG PO TABS: 25.0000 mg | ORAL_TABLET | Freq: Every day | ORAL | 11 refills | Status: DC

## 2016-07-24 NOTE — Progress Notes (Signed)
we discussed code status.  pt requests full code, but would not want to be started or maintained on artificial life-support measures if there was not a reasonable chance of recovery 

## 2016-07-24 NOTE — Patient Instructions (Addendum)
check your blood sugar twice day.  vary the time of day when you check, between before the 3 meals, and at bedtime.  also check if you have symptoms of your blood sugar being too high or too low.  please keep a record of the readings and bring it to your next appointment here (or you can bring the meter itself).  You can write it on any piece of paper.  please call us sooner if your blood sugar goes below 70, or if you have a lot of readings over 200. Please continue the 70/30 insulin to 60 units with breakfast, and 40 units with the evening meal.   Please resume the losartan.  I have sent a prescription to your pharmacy.   please let me know what your wishes would be, if artificial life support measures should become necessary.  It is critically important to prevent falling down (keep floor areas well-lit, dry, and free of loose objects.  If you have a cane, walker, or wheelchair, you should use it, even for short trips around the house.  Wear flat-soled shoes.  Also, try not to rush).   It is critically important to prevent falling down (keep floor areas well-lit, dry, and free of loose objects.  If you have a cane, walker, or wheelchair, you should use it, even for short trips around the house.  Wear flat-soled shoes.  Also, try not to rush).  Please come back for a follow-up appointment in 2-3 months.

## 2016-07-24 NOTE — Progress Notes (Signed)
Subjective:    Patient ID: Albert Reed, male    DOB: 21-Feb-1951, 65 y.o.   MRN: [id number]  HPI Pt returns for f/u of diabetes mellitus: DM type: Insulin-requiring type 2 Dx'ed: 0000000 Complications: none Therapy: insulin since 2013 DKA: never.  Severe hypoglycemia: never.  Pancreatitis: never.  Other: he takes human insulin, due to cost.  Interval history: He takes 70/30 insulin 60 units qam and 40 units qpm.  He says he never misses the insulin.  He brings a record of his cbg's which I have reviewed today.  It varies from 69-259.  There is no trend throughout the day.  He also cannot afford losartan.   Past Medical History:  Diagnosis Date  . COLONIC POLYPS, HX OF 04/01/2007  . DIVERTICULOSIS, COLON 04/01/2007  . DM 11/24/2008  . HYPERCHOLESTEROLEMIA 07/28/2008  . HYPERTENSION 04/01/2007  . HYPERTHYROIDISM 11/09/2009  . LEUKOPENIA, CHRONIC 07/28/2008    Past Surgical History:  Procedure Laterality Date  . BUNIONECTOMY      Social History   Social History  . Marital status: Married    Spouse name: N/A  . Number of children: N/A  . Years of education: N/A   Occupational History  . [employer]    Group Home   Social History Main Topics  . Smoking status: Never Smoker  . Smokeless tobacco: Never Used  . Alcohol use No  . Drug use: No  . Sexual activity: Not on file   Other Topics Concern  . Not on file   Social History Narrative   Originally from [place] (in [place] since [place])    Current Outpatient Prescriptions on File Prior to Visit  Medication Sig Dispense Refill  . aspirin 81 MG tablet Take 81 mg by mouth daily.      Marland Kitchen glucose blood (ONE TOUCH ULTRA TEST) test strip Use to check blood sugar 2 times per day. 100 each 2  . insulin NPH-regular Human (NOVOLIN 70/30) (70-30) 100 UNIT/ML injection 60 units with breakfast, and 40 units with the evening meal, and syringes 2/day 40 mL 11  . Insulin Syringe-Needle U-100 (BD INSULIN SYRINGE  ULTRAFINE) 31G X 5/16" 0.5 ML MISC 1 each by Does not apply route daily. 100 each 1  . Multiple Vitamins-Minerals (CENTRUM SILVER ULTRA MENS) TABS Take 1 tablet by mouth daily.      . ONE TOUCH ULTRA TEST test strip USE ONE STRIP TO CHECK GLUCOSE TWICE DAILY 100 each 12  . sildenafil (REVATIO) 20 MG tablet 1-5 tabs, as needed for ED symptoms 50 tablet 11   No current facility-administered medications on file prior to visit.     No Known Allergies  Family History  Problem Relation Age of Onset  . Diabetes Brother     Type 2  . Colon cancer Neg Hx     patient dose not know much about family    BP (!) 155/71   Pulse 89   Wt 187 lb (84.8 kg) Comment: with Jacket on  BMI 31.60 kg/m    Review of Systems Denies LOC    Objective:   Physical Exam VITAL SIGNS:  See vs page GENERAL: no distress SKIN:  Insulin injection sites at the anterior abdomen are normal.  Pulses: dorsalis pedis intact bilat.   MSK: no deformity of the feet CV: no leg edema Skin:  no ulcer on the feet.  normal color and temp on the feet. Neuro: sensation is intact to touch on the feet.  Assessment & Plan:  Insulin-requiring type 2 DM: improved control. Please continue the same insulin for now. HTN: worse.  He needs to resume rx.  I sent rx  Subjective:   Patient here for Medicare annual wellness visit and management of other chronic and acute problems.     Risk factors: advanced age    74 of Physicians Providing Medical Care to Patient:  See "snapshot"   Activities of Daily Living: In your present state of health, do you have any difficulty performing the following activities (lives with wife)?:   Preparing food and eating?: No  Bathing yourself: No  Getting dressed: No  Using the toilet:No  Moving around from place to place: No  In the past year have you fallen or had a near fall?: No    Home Safety: Has smoke detector and wears seat belts. No firearms.    Diet and Exercise  Current  exercise habits: pt says "fair." Dietary issues discussed: pt reports a healthy diet   Depression Screen  Q1: Over the past two weeks, have you felt down, depressed or hopeless? no  Q2: Over the past two weeks, have you felt little interest or pleasure in doing things? no   The following portions of the patient's history were reviewed and updated as appropriate: allergies, current medications, past family history, past medical history, past social history, past surgical history and problem list.   Review of Systems  Denies hearing loss, and visual loss.   Objective:   Vision:  Advertising account executive, so he declines VA today.   Hearing: grossly normal.  Body mass index:  See vs page.   Msk: pt easily and quickly performs "get-up-and-go" from a sitting position.   Cognitive Impairment Assessment: cognition, memory and judgment appear normal.  remembers 3/3 at 5 minutes.  excellent recall.  can easily read and write a sentence.  alert and oriented x 3.   Assessment:   Medicare wellness utd on preventive parameters    Plan:   During the course of the visit the patient was educated and counseled about appropriate screening and preventive services including:        Fall prevention   Diabetes screening  Nutrition counseling   Vaccines / LABS Zostavax / Pneumococcal Vaccine  today  PSA  Patient Instructions (the written plan) was given to the patient.

## 2016-10-15 ENCOUNTER — Other Ambulatory Visit: Payer: Self-pay | Admitting: Endocrinology

## 2016-10-23 ENCOUNTER — Encounter: Payer: Self-pay | Admitting: Endocrinology

## 2016-10-23 ENCOUNTER — Ambulatory Visit (INDEPENDENT_AMBULATORY_CARE_PROVIDER_SITE_OTHER): Payer: PPO | Admitting: Endocrinology

## 2016-10-23 VITALS — BP 136/70 | HR 87 | Ht 64.5 in | Wt 186.0 lb

## 2016-10-23 DIAGNOSIS — E119 Type 2 diabetes mellitus without complications: Secondary | ICD-10-CM | POA: Diagnosis not present

## 2016-10-23 DIAGNOSIS — Z794 Long term (current) use of insulin: Secondary | ICD-10-CM

## 2016-10-23 LAB — POCT GLYCOSYLATED HEMOGLOBIN (HGB A1C): Hemoglobin A1C: 9.2

## 2016-10-23 MED ORDER — INSULIN NPH ISOPHANE & REGULAR (70-30) 100 UNIT/ML ~~LOC~~ SUSP: SUBCUTANEOUS | 11 refills | Status: DC

## 2016-10-23 NOTE — Progress Notes (Signed)
Subjective:    Patient ID: Albert Reed, male    DOB: September 18, 1950, 66 y.o.   MRN: [id number]  HPI Pt returns for f/u of diabetes mellitus: DM type: Insulin-requiring type 2 Dx'ed: 2010 Complications: none Therapy: insulin since 2013 DKA: never.  Severe hypoglycemia: never.  Pancreatitis: never.  Other: he takes human insulin, due to cost. He declines multiple daily injections.   Interval history: He takes 70/30 insulin 60 units qam and 40 units qpm.  He says he seldom misses the insulin.  He brings a record of his cbg's which I have reviewed today.  It varies from 80-400.  It is in general higher as the day goes on. Past Medical History:  Diagnosis Date  . COLONIC POLYPS, HX OF 04/01/2007  . DIVERTICULOSIS, COLON 04/01/2007  . DM 11/24/2008  . HYPERCHOLESTEROLEMIA 07/28/2008  . HYPERTENSION 04/01/2007  . HYPERTHYROIDISM 11/09/2009  . LEUKOPENIA, CHRONIC 07/28/2008    Past Surgical History:  Procedure Laterality Date  . BUNIONECTOMY      Social History   Social History  . Marital status: Married    Spouse name: N/A  . Number of children: N/A  . Years of education: N/A   Occupational History  . [employer]    Group Home   Social History Main Topics  . Smoking status: Never Smoker  . Smokeless tobacco: Never Used  . Alcohol use No  . Drug use: No  . Sexual activity: Not on file   Other Topics Concern  . Not on file   Social History Narrative   Originally from [place] (in [place] since [place])    Current Outpatient Prescriptions on File Prior to Visit  Medication Sig Dispense Refill  . aspirin 81 MG tablet Take 81 mg by mouth daily.      . Insulin Syringe-Needle U-100 (BD INSULIN SYRINGE ULTRAFINE) 31G X 5/16" 0.5 ML MISC 1 each by Does not apply route daily. 100 each 1  . losartan (COZAAR) 50 MG tablet Take 0.5 tablets (25 mg total) by mouth daily. 15 tablet 11  . Multiple Vitamins-Minerals (CENTRUM SILVER ULTRA MENS) TABS Take 1 tablet by mouth  daily.      . ONE TOUCH ULTRA TEST test strip USE ONE STRIP TO CHECK GLUCOSE TWICE DAILY 100 each 12  . ONE TOUCH ULTRA TEST test strip Use to check blood sugar 2 times per day. 50 each 2  . sildenafil (REVATIO) 20 MG tablet 1-5 tabs, as needed for ED symptoms 50 tablet 11   No current facility-administered medications on file prior to visit.     No Known Allergies  Family History  Problem Relation Age of Onset  . Diabetes Brother     Type 2  . Colon cancer Neg Hx     patient dose not know much about family    BP 136/70   Pulse 87   Ht 5' 4.5" (1.638 m)   Wt 186 lb (84.4 kg)   SpO2 97%   BMI 31.43 kg/m    Review of Systems He denies hypoglycemia.     Objective:   Physical Exam VITAL SIGNS:  See vs page GENERAL: no distress Pulses: dorsalis pedis intact bilat.   MSK: no deformity of the feet CV: no leg edema Skin:  no ulcer on the feet.  normal color and temp on the feet. Neuro: sensation is intact to touch on the feet.    A1c=9.2%    Assessment & Plan:  Insulin-requiring type  2 DM: poor control.  Patient is advised the following: Patient Instructions  check your blood sugar twice day.  vary the time of day when you check, between before the 3 meals, and at bedtime.  also check if you have symptoms of your blood sugar being too high or too low.  please keep a record of the readings and bring it to your next appointment here (or you can bring the meter itself).  You can write it on any piece of paper.  please call us sooner if your blood sugar goes below 70, or if you have a lot of readings over 200.   Please change the 70/30 insulin to 80 units with breakfast, and 30 units with the evening meal.   Please come back for a follow-up appointment in 2-3 months.

## 2016-10-23 NOTE — Patient Instructions (Addendum)
check your blood sugar twice day.  vary the time of day when you check, between before the 3 meals, and at bedtime.  also check if you have symptoms of your blood sugar being too high or too low.  please keep a record of the readings and bring it to your next appointment here (or you can bring the meter itself).  You can write it on any piece of paper.  please call us sooner if your blood sugar goes below 70, or if you have a lot of readings over 200.   Please change the 70/30 insulin to 80 units with breakfast, and 30 units with the evening meal.   Please come back for a follow-up appointment in 2-3 months.

## 2016-11-28 ENCOUNTER — Telehealth: Payer: Self-pay | Admitting: Endocrinology

## 2016-11-28 MED ORDER — MEFLOQUINE HCL 250 MG PO TABS: 250.0000 mg | ORAL_TABLET | ORAL | 0 refills | Status: DC

## 2016-11-28 NOTE — Telephone Encounter (Signed)
Pt called in and requested that there be something sent to his pharmacy to prevent Malaria.  He is traveling to [place] on Monday. Please advise.

## 2016-11-28 NOTE — Telephone Encounter (Signed)
See message and please advise, Thanks!  

## 2016-11-28 NOTE — Telephone Encounter (Signed)
I contacted the patient and advised Lariam has been submitted to [facility] via voicemail.

## 2016-11-28 NOTE — Telephone Encounter (Signed)
I have sent a prescription to your pharmacy  

## 2016-12-23 ENCOUNTER — Other Ambulatory Visit: Payer: Self-pay | Admitting: Endocrinology

## 2017-01-22 ENCOUNTER — Ambulatory Visit (INDEPENDENT_AMBULATORY_CARE_PROVIDER_SITE_OTHER): Payer: PPO | Admitting: Endocrinology

## 2017-01-22 ENCOUNTER — Encounter: Payer: Self-pay | Admitting: Endocrinology

## 2017-01-22 VITALS — BP 130/76 | HR 78 | Ht 64.5 in | Wt 183.0 lb

## 2017-01-22 DIAGNOSIS — E119 Type 2 diabetes mellitus without complications: Secondary | ICD-10-CM | POA: Diagnosis not present

## 2017-01-22 DIAGNOSIS — Z794 Long term (current) use of insulin: Secondary | ICD-10-CM

## 2017-01-22 LAB — POCT GLYCOSYLATED HEMOGLOBIN (HGB A1C): Hemoglobin A1C: 9

## 2017-01-22 NOTE — Patient Instructions (Addendum)
check your blood sugar twice day.  vary the time of day when you check, between before the 3 meals, and at bedtime.  also check if you have symptoms of your blood sugar being too high or too low.  please keep a record of the readings and bring it to your next appointment here (or you can bring the meter itself).  You can write it on any piece of paper.  please call us sooner if your blood sugar goes below 70, or if you have a lot of readings over 200.   Please take the 70/30 insulin to 80 units with breakfast, and 30 units with the evening meal.   However, for the rest of the Ramadan fast, take 20 units with breakfast, and 70 units with the evening meal.   Please come back for a follow-up appointment in 3 months.

## 2017-01-22 NOTE — Progress Notes (Signed)
Subjective:    Patient ID: Albert Reed, male    DOB: [birth year], 66 y.o.   MRN: [id number]  HPI Pt returns for f/u of diabetes mellitus: DM type: Insulin-requiring type 2 Dx'ed: 0086 Complications: none Therapy: insulin since 2013 DKA: never.  Severe hypoglycemia: never.  Pancreatitis: never.  Other: he takes human insulin, due to cost. He declines multiple daily injections.   Interval history: He says he seldom misses the insulin.  He has been taking less am insulin, due to Ramadan fasting.   Past Medical History:  Diagnosis Date  . COLONIC POLYPS, HX OF 04/01/2007  . DIVERTICULOSIS, COLON 04/01/2007  . DM 11/24/2008  . HYPERCHOLESTEROLEMIA 07/28/2008  . HYPERTENSION 04/01/2007  . HYPERTHYROIDISM 11/09/2009  . LEUKOPENIA, CHRONIC 07/28/2008    Past Surgical History:  Procedure Laterality Date  . BUNIONECTOMY      Social History   Social History  . Marital status: Married    Spouse name: N/A  . Number of children: N/A  . Years of education: N/A   Occupational History  . [employer]    Group Home   Social History Main Topics  . Smoking status: Never Smoker  . Smokeless tobacco: Never Used  . Alcohol use No  . Drug use: No  . Sexual activity: Not on file   Other Topics Concern  . Not on file   Social History Narrative   Originally from [place] (in [place] since [place]    Current Outpatient Prescriptions on File Prior to Visit  Medication Sig Dispense Refill  . aspirin 81 MG tablet Take 81 mg by mouth daily.      . insulin NPH-regular Human (NOVOLIN 70/30) (70-30) 100 UNIT/ML injection 80 units with breakfast, and 30 units with the evening meal, and syringes 2/day 40 mL 11  . Insulin Syringe-Needle U-100 (BD INSULIN SYRINGE ULTRAFINE) 31G X 5/16" 0.5 ML MISC 1 each by Does not apply route daily. 100 each 1  . losartan (COZAAR) 50 MG tablet Take 0.5 tablets (25 mg total) by mouth daily. 15 tablet 11  . mefloquine (LARIAM) 250 MG tablet Take 1  tablet (250 mg total) by mouth every 7 (seven) days. 5 tablet 0  . Multiple Vitamins-Minerals (CENTRUM SILVER ULTRA MENS) TABS Take 1 tablet by mouth daily.      . ONE TOUCH ULTRA TEST test strip USE ONE STRIP TO CHECK GLUCOSE TWICE DAILY 100 each 12  . ONE TOUCH ULTRA TEST test strip Use to check blood sugar 2 times per day. 50 each 2  . sildenafil (REVATIO) 20 MG tablet 1-5 tabs, as needed for ED symptoms 50 tablet 11  . SURE COMFORT INSULIN SYRINGE 31G X 5/16" 1 ML MISC USE TO u=inject insulin THREE TIMES DAILY 100 each 2   No current facility-administered medications on file prior to visit.     No Known Allergies  Family History  Problem Relation Age of Onset  . Diabetes Brother        Type 2  . Colon cancer Neg Hx        patient dose not know much about family    BP 130/76   Pulse 78   Ht 5' 4.5" (1.638 m)   Wt 183 lb (83 kg)   SpO2 97%   BMI 30.93 kg/m    Review of Systems He says he does not lose weight over the Ramadan fast.      Objective:   Physical Exam VITAL SIGNS:  See vs page GENERAL: no distress Pulses: dorsalis pedis intact bilat.   MSK: no deformity of the feet CV: no leg edema Skin:  no ulcer on the feet.  normal color and temp on the feet. Neuro: sensation is intact to touch on the feet.   A1c=9.0%     Assessment & Plan:  Insulin-requiring type 2 DM: he needs increased rx.     Patient Instructions  check your blood sugar twice day.  vary the time of day when you check, between before the 3 meals, and at bedtime.  also check if you have symptoms of your blood sugar being too high or too low.  please keep a record of the readings and bring it to your next appointment here (or you can bring the meter itself).  You can write it on any piece of paper.  please call us sooner if your blood sugar goes below 70, or if you have a lot of readings over 200.   Please take the 70/30 insulin to 80 units with breakfast, and 30 units with the evening meal.     However, for the rest of the Ramadan fast, take 20 units with breakfast, and 70 units with the evening meal.   Please come back for a follow-up appointment in 3 months.

## 2017-02-14 ENCOUNTER — Other Ambulatory Visit: Payer: Self-pay | Admitting: Endocrinology

## 2017-04-24 ENCOUNTER — Encounter: Payer: Self-pay | Admitting: Endocrinology

## 2017-04-24 ENCOUNTER — Ambulatory Visit (INDEPENDENT_AMBULATORY_CARE_PROVIDER_SITE_OTHER): Payer: PPO | Admitting: Endocrinology

## 2017-04-24 VITALS — BP 114/62 | HR 87 | Wt 188.8 lb

## 2017-04-24 DIAGNOSIS — Z125 Encounter for screening for malignant neoplasm of prostate: Secondary | ICD-10-CM | POA: Diagnosis not present

## 2017-04-24 DIAGNOSIS — E0590 Thyrotoxicosis, unspecified without thyrotoxic crisis or storm: Secondary | ICD-10-CM | POA: Diagnosis not present

## 2017-04-24 DIAGNOSIS — Z0000 Encounter for general adult medical examination without abnormal findings: Secondary | ICD-10-CM | POA: Diagnosis not present

## 2017-04-24 DIAGNOSIS — E119 Type 2 diabetes mellitus without complications: Secondary | ICD-10-CM

## 2017-04-24 DIAGNOSIS — Z23 Encounter for immunization: Secondary | ICD-10-CM

## 2017-04-24 DIAGNOSIS — Z794 Long term (current) use of insulin: Secondary | ICD-10-CM

## 2017-04-24 LAB — BASIC METABOLIC PANEL
BUN: 17 mg/dL (ref 6–23)
CHLORIDE: 100 meq/L (ref 96–112)
CO2: 31 meq/L (ref 19–32)
Calcium: 9 mg/dL (ref 8.4–10.5)
Creatinine, Ser: 1.19 mg/dL (ref 0.40–1.50)
GFR: 78.7 mL/min (ref >60.00)
Glucose, Bld: 188 mg/dL — ABNORMAL HIGH (ref 70–99)
POTASSIUM: 3.9 meq/L (ref 3.5–5.1)
Sodium: 138 mEq/L (ref 135–145)

## 2017-04-24 LAB — CBC WITH DIFFERENTIAL/PLATELET
BASOS PCT: 0.5 % (ref 0.0–3.0)
Basophils Absolute: 0 10*3/uL (ref 0.0–0.1)
EOS PCT: 1.2 % (ref 0.0–5.0)
Eosinophils Absolute: 0 10*3/uL (ref 0.0–0.7)
HEMATOCRIT: 40.2 % (ref 39.0–52.0)
HEMOGLOBIN: 13.5 g/dL (ref 13.0–17.0)
LYMPHS PCT: 31.9 % (ref 12.0–46.0)
Lymphs Abs: 1.3 10*3/uL (ref 0.7–4.0)
MCHC: 33.6 g/dL (ref 30.0–36.0)
MCV: 88.1 fl (ref 78.0–100.0)
Monocytes Absolute: 0.4 10*3/uL (ref 0.1–1.0)
Monocytes Relative: 10.9 % (ref 3.0–12.0)
NEUTROS ABS: 2.3 10*3/uL (ref 1.4–7.7)
Neutrophils Relative %: 55.5 % (ref 43.0–77.0)
PLATELETS: 191 10*3/uL (ref 150.0–400.0)
RBC: 4.56 Mil/uL (ref 4.22–5.81)
RDW: 16 % — AB (ref 11.5–15.5)
WBC: 4.1 10*3/uL (ref 4.0–10.5)

## 2017-04-24 LAB — URINALYSIS, ROUTINE W REFLEX MICROSCOPIC
Bilirubin Urine: NEGATIVE
HGB URINE DIPSTICK: NEGATIVE
Ketones, ur: NEGATIVE
Leukocytes, UA: NEGATIVE
Nitrite: NEGATIVE
Specific Gravity, Urine: 1.01 (ref 1.000–1.030)
Total Protein, Urine: NEGATIVE
URINE GLUCOSE: 500 — AB
UROBILINOGEN UA: 0.2 (ref 0.0–1.0)
pH: 6 (ref 5.0–8.0)

## 2017-04-24 LAB — MICROALBUMIN / CREATININE URINE RATIO
CREATININE, U: 89.1 mg/dL
Microalb Creat Ratio: 0.8 mg/g (ref 0.0–30.0)
Microalb, Ur: <0.7 mg/dL (ref 0.0–1.9)

## 2017-04-24 LAB — LIPID PANEL
CHOLESTEROL: 144 mg/dL (ref 0–200)
HDL: 29.4 mg/dL — ABNORMAL LOW (ref >39.00)
NonHDL: 114.83
TRIGLYCERIDES: 330 mg/dL — AB (ref 0.0–149.0)
Total CHOL/HDL Ratio: 5
VLDL: 66 mg/dL — AB (ref 0.0–40.0)

## 2017-04-24 LAB — HEPATIC FUNCTION PANEL
ALT: 21 U/L (ref 0–53)
AST: 18 U/L (ref 0–37)
Albumin: 4 g/dL (ref 3.5–5.2)
Alkaline Phosphatase: 95 U/L (ref 39–117)
BILIRUBIN DIRECT: 0.1 mg/dL (ref 0.0–0.3)
BILIRUBIN TOTAL: 0.3 mg/dL (ref 0.2–1.2)
Total Protein: 6.6 g/dL (ref 6.0–8.3)

## 2017-04-24 LAB — LDL CHOLESTEROL, DIRECT: Direct LDL: 76 mg/dL

## 2017-04-24 LAB — TSH: TSH: 1.66 u[IU]/mL (ref 0.35–4.50)

## 2017-04-24 LAB — PSA: PSA: 0.22 ng/mL (ref 0.10–4.00)

## 2017-04-24 LAB — POCT GLYCOSYLATED HEMOGLOBIN (HGB A1C): HEMOGLOBIN A1C: 8.5

## 2017-04-24 MED ORDER — INSULIN NPH ISOPHANE & REGULAR (70-30) 100 UNIT/ML ~~LOC~~ SUSP: SUBCUTANEOUS | 11 refills | Status: DC

## 2017-04-24 NOTE — Patient Instructions (Addendum)
Please increase the insulin to 80 units with breakfast, and 30 units with the evening meal. Please consider these measures for your health:  minimize alcohol.  Do not use tobacco products.  Have a colonoscopy at least every 10 years from age 66.  Keep firearms safely stored.  Always use seat belts.  have working smoke alarms in your home.  See an eye doctor and dentist regularly.  Never drive under the influence of alcohol or drugs (including prescription drugs).   please let me know what your wishes would be, if artificial life support measures should become necessary.  It is critically important to prevent falling down (keep floor areas well-lit, dry, and free of loose objects.  If you have a cane, walker, or wheelchair, you should use it, even for short trips around the house.  Wear flat-soled shoes.  Also, try not to rush).   blood tests are requested for you today.  We'll let you know about the results. Please come back for a follow-up appointment in 2-3 months.

## 2017-04-24 NOTE — Progress Notes (Signed)
Subjective:    Patient ID: Albert Reed, male    DOB: Jun 18, 1951, 66 y.o.   MRN: [id number]  HPI Pt is here for regular wellness examination, and is feeling pretty well in general, and says chronic med probs are stable, except as noted below Past Medical History:  Diagnosis Date  . COLONIC POLYPS, HX OF 04/01/2007  . DIVERTICULOSIS, COLON 04/01/2007  . DM 11/24/2008  . HYPERCHOLESTEROLEMIA 07/28/2008  . HYPERTENSION 04/01/2007  . HYPERTHYROIDISM 11/09/2009  . LEUKOPENIA, CHRONIC 07/28/2008    Past Surgical History:  Procedure Laterality Date  . BUNIONECTOMY      Social History   Social History  . Marital status: Married    Spouse name: N/A  . Number of children: N/A  . Years of education: N/A   Occupational History  . [employer]    Group Home   Social History Main Topics  . Smoking status: Never Smoker  . Smokeless tobacco: Never Used  . Alcohol use No  . Drug use: No  . Sexual activity: Not on file   Other Topics Concern  . Not on file   Social History Narrative   Originally from [place] (in [place] since [place]    Current Outpatient Prescriptions on File Prior to Visit  Medication Sig Dispense Refill  . aspirin 81 MG tablet Take 81 mg by mouth daily.      . Insulin Syringe-Needle U-100 (BD INSULIN SYRINGE ULTRAFINE) 31G X 5/16" 0.5 ML MISC 1 each by Does not apply route daily. 100 each 1  . losartan (COZAAR) 50 MG tablet Take 0.5 tablets (25 mg total) by mouth daily. 15 tablet 11  . mefloquine (LARIAM) 250 MG tablet Take 1 tablet (250 mg total) by mouth every 7 (seven) days. 5 tablet 0  . Multiple Vitamins-Minerals (CENTRUM SILVER ULTRA MENS) TABS Take 1 tablet by mouth daily.      . ONE TOUCH ULTRA TEST test strip USE ONE STRIP TO CHECK GLUCOSE TWICE DAILY 100 each 12  . ONE TOUCH ULTRA TEST test strip Use to check blood sugar 2 times per day. 50 each 2  . sildenafil (REVATIO) 20 MG tablet 1-5 tabs, as needed for ED symptoms 50 tablet 11  .  SURE COMFORT INSULIN SYRINGE 31G X 5/16" 1 ML MISC USE TO u=inject insulin THREE TIMES DAILY 100 each 2   No current facility-administered medications on file prior to visit.     No Known Allergies  Family History  Problem Relation Age of Onset  . Diabetes Brother        Type 2  . Colon cancer Neg Hx        patient dose not know much about family    BP 114/62   Pulse 87   Wt 188 lb 12.8 oz (85.6 kg)   SpO2 96%   BMI 31.91 kg/m   Review of Systems Denies fever, fatigue, visual loss, hearing loss, chest pain, sob, back pain, depression, cold intolerance, BRBPR, hematuria, numbness, allergy sxs, easy bruising, and rash.     Objective:   Physical Exam VS: see vs page GEN: no distress HEAD: head: no deformity eyes: no periorbital swelling, no proptosis external nose and ears are normal mouth: no lesion seen NECK: supple, thyroid is not enlarged CHEST WALL: no deformity LUNGS: clear to auscultation BREASTS:  No gynecomastia CV: reg rate and rhythm, no murmur ABD: abdomen is soft, nontender.  no hepatosplenomegaly.  not distended.  no hernia RECTAL: normal  external and internal exam.  heme neg. PROSTATE:  Normal size.  No nodule MUSCULOSKELETAL: muscle bulk and strength are grossly normal.  no obvious joint swelling.  gait is normal and steady PULSES: no carotid bruit NEURO:  cn 2-12 grossly intact.   readily moves all 4's.  SKIN:  Normal texture and temperature.  No rash or suspicious lesion is visible.   NODES:  None palpable at the neck PSYCH: alert, well-oriented.  Does not appear anxious nor depressed.   I personally reviewed electrocardiogram tracing (today): Indication: DM Impression: NSR.  No MI.  No hypertrophy. Compared to 2017: no significant change     Assessment & Plan:  Wellness visit today, with problems stable, except as noted.  SEPARATE EVALUATION FOLLOWS--EACH PROBLEM HERE IS NEW, NOT RESPONDING TO TREATMENT, OR POSES SIGNIFICANT RISK TO THE  PATIENT'S HEALTH: HISTORY OF THE PRESENT ILLNESS: Pt returns for f/u of diabetes mellitus: DM type: Insulin-requiring type 2 Dx'ed: 9622 Complications: none Therapy: insulin since 2013 DKA: never.  Severe hypoglycemia: never.  Pancreatitis: never.  Other: he takes human insulin, due to cost. He declines multiple daily injections.   Interval history: he says he never misses the insulin, but he takes only 70 units qam and 30 units qpm. He brings a record of his cbg's which I have reviewed today.  It varies from 55-300's. It is in general higher as the day goes on PAST MEDICAL HISTORY Past Medical History:  Diagnosis Date  . COLONIC POLYPS, HX OF 04/01/2007  . DIVERTICULOSIS, COLON 04/01/2007  . DM 11/24/2008  . HYPERCHOLESTEROLEMIA 07/28/2008  . HYPERTENSION 04/01/2007  . HYPERTHYROIDISM 11/09/2009  . LEUKOPENIA, CHRONIC 07/28/2008    Past Surgical History:  Procedure Laterality Date  . BUNIONECTOMY      Social History   Social History  . Marital status: Married    Spouse name: N/A  . Number of children: N/A  . Years of education: N/A   Occupational History  . [employer]    Group Home   Social History Main Topics  . Smoking status: Never Smoker  . Smokeless tobacco: Never Used  . Alcohol use No  . Drug use: No  . Sexual activity: Not on file   Other Topics Concern  . Not on file   Social History Narrative   Originally from [place] (in [place] since [place]    Current Outpatient Prescriptions on File Prior to Visit  Medication Sig Dispense Refill  . aspirin 81 MG tablet Take 81 mg by mouth daily.      . Insulin Syringe-Needle U-100 (BD INSULIN SYRINGE ULTRAFINE) 31G X 5/16" 0.5 ML MISC 1 each by Does not apply route daily. 100 each 1  . losartan (COZAAR) 50 MG tablet Take 0.5 tablets (25 mg total) by mouth daily. 15 tablet 11  . mefloquine (LARIAM) 250 MG tablet Take 1 tablet (250 mg total) by mouth every 7 (seven) days. 5 tablet 0  . Multiple  Vitamins-Minerals (CENTRUM SILVER ULTRA MENS) TABS Take 1 tablet by mouth daily.      . ONE TOUCH ULTRA TEST test strip USE ONE STRIP TO CHECK GLUCOSE TWICE DAILY 100 each 12  . ONE TOUCH ULTRA TEST test strip Use to check blood sugar 2 times per day. 50 each 2  . sildenafil (REVATIO) 20 MG tablet 1-5 tabs, as needed for ED symptoms 50 tablet 11  . SURE COMFORT INSULIN SYRINGE 31G X 5/16" 1 ML MISC USE TO u=inject insulin THREE TIMES DAILY  100 each 2   No current facility-administered medications on file prior to visit.     No Known Allergies  Family History  Problem Relation Age of Onset  . Diabetes Brother        Type 2  . Colon cancer Neg Hx        patient dose not know much about family    BP 114/62   Pulse 87   Wt 188 lb 12.8 oz (85.6 kg)   SpO2 96%   BMI 31.91 kg/m   REVIEW OF SYSTEMS: He denies LOC PHYSICAL EXAMINATION: VITAL SIGNS:  See vs page GENERAL: no distress Pulses: foot pulses are intact bilaterally.   MSK: no deformity of the feet or ankles.  CV: no edema of the legs or ankles Skin:  no ulcer on the feet or ankles.  normal color and temp on the feet and ankles Neuro: sensation is intact to touch on the feet and ankles.   LAB/XRAY RESULTS: A1c=8.5% IMPRESSION: Insulin-requiring type 2 DM: he needs increased rx PLAN:  increase the insulin to 80 units with breakfast, and 30 units with the evening meal

## 2017-04-28 ENCOUNTER — Other Ambulatory Visit: Payer: Self-pay | Admitting: Endocrinology

## 2017-05-05 ENCOUNTER — Other Ambulatory Visit: Payer: Self-pay | Admitting: Endocrinology

## 2017-05-08 ENCOUNTER — Other Ambulatory Visit: Payer: Self-pay | Admitting: Endocrinology

## 2017-05-08 DIAGNOSIS — Z794 Long term (current) use of insulin: Secondary | ICD-10-CM

## 2017-05-08 DIAGNOSIS — Z23 Encounter for immunization: Secondary | ICD-10-CM

## 2017-05-08 DIAGNOSIS — Z125 Encounter for screening for malignant neoplasm of prostate: Secondary | ICD-10-CM

## 2017-05-08 DIAGNOSIS — I10 Essential (primary) hypertension: Secondary | ICD-10-CM

## 2017-05-08 DIAGNOSIS — E119 Type 2 diabetes mellitus without complications: Secondary | ICD-10-CM

## 2017-05-08 DIAGNOSIS — Z0000 Encounter for general adult medical examination without abnormal findings: Secondary | ICD-10-CM

## 2017-05-08 DIAGNOSIS — D72819 Decreased white blood cell count, unspecified: Secondary | ICD-10-CM

## 2017-05-15 DIAGNOSIS — H25813 Combined forms of age-related cataract, bilateral: Secondary | ICD-10-CM | POA: Diagnosis not present

## 2017-05-15 DIAGNOSIS — H52222 Regular astigmatism, left eye: Secondary | ICD-10-CM | POA: Diagnosis not present

## 2017-05-15 DIAGNOSIS — H5202 Hypermetropia, left eye: Secondary | ICD-10-CM | POA: Diagnosis not present

## 2017-05-26 ENCOUNTER — Other Ambulatory Visit: Payer: Self-pay

## 2017-05-26 DIAGNOSIS — Z23 Encounter for immunization: Secondary | ICD-10-CM

## 2017-05-26 DIAGNOSIS — Z125 Encounter for screening for malignant neoplasm of prostate: Secondary | ICD-10-CM

## 2017-05-26 DIAGNOSIS — Z0000 Encounter for general adult medical examination without abnormal findings: Secondary | ICD-10-CM

## 2017-05-26 DIAGNOSIS — Z794 Long term (current) use of insulin: Secondary | ICD-10-CM

## 2017-05-26 DIAGNOSIS — E119 Type 2 diabetes mellitus without complications: Secondary | ICD-10-CM

## 2017-05-26 DIAGNOSIS — I10 Essential (primary) hypertension: Secondary | ICD-10-CM

## 2017-05-26 MED ORDER — SILDENAFIL CITRATE 20 MG PO TABS: ORAL_TABLET | ORAL | 11 refills | Status: DC

## 2017-06-16 ENCOUNTER — Other Ambulatory Visit: Payer: Self-pay | Admitting: Endocrinology

## 2017-06-25 ENCOUNTER — Other Ambulatory Visit: Payer: Self-pay | Admitting: Endocrinology

## 2017-07-25 ENCOUNTER — Encounter: Payer: Self-pay | Admitting: Endocrinology

## 2017-07-25 ENCOUNTER — Ambulatory Visit (INDEPENDENT_AMBULATORY_CARE_PROVIDER_SITE_OTHER): Payer: PPO | Admitting: Endocrinology

## 2017-07-25 VITALS — BP 138/68 | HR 91 | Wt 190.0 lb

## 2017-07-25 DIAGNOSIS — Z794 Long term (current) use of insulin: Secondary | ICD-10-CM | POA: Diagnosis not present

## 2017-07-25 DIAGNOSIS — E119 Type 2 diabetes mellitus without complications: Secondary | ICD-10-CM | POA: Diagnosis not present

## 2017-07-25 LAB — POCT GLYCOSYLATED HEMOGLOBIN (HGB A1C): HEMOGLOBIN A1C: 8

## 2017-07-25 MED ORDER — INSULIN NPH ISOPHANE & REGULAR (70-30) 100 UNIT/ML ~~LOC~~ SUSP: SUBCUTANEOUS | 11 refills | Status: DC

## 2017-07-25 MED ORDER — DOXYCYCLINE HYCLATE 100 MG PO TABS: 100.0000 mg | ORAL_TABLET | Freq: Every day | ORAL | 3 refills | Status: DC

## 2017-07-25 MED ORDER — ATORVASTATIN CALCIUM 10 MG PO TABS: 10.0000 mg | ORAL_TABLET | Freq: Every day | ORAL | 3 refills | Status: DC

## 2017-07-25 NOTE — Progress Notes (Signed)
Subjective:    Patient ID: Albert Reed, male    DOB: 05-24-1951, 66 y.o.   MRN: [id number]  HPI Pt returns for f/u of diabetes mellitus: DM type: Insulin-requiring type 2.  Dx'ed: 1997.  Complications: none.  Therapy: insulin since 2013.  DKA: never.  Severe hypoglycemia: never.  Pancreatitis: never.  Other: he takes human insulin, due to cost. He declines multiple daily injections.   Interval history: he says he never misses the insulin, but he takes only 80 units qam and 30 units qpm.  He brings a record of his cbg's which I have reviewed today.  It varies from 58-300's. It is in general higher as the day goes on.  Past Medical History:  Diagnosis Date  . COLONIC POLYPS, HX OF 04/01/2007  . DIVERTICULOSIS, COLON 04/01/2007  . DM 11/24/2008  . HYPERCHOLESTEROLEMIA 07/28/2008  . HYPERTENSION 04/01/2007  . HYPERTHYROIDISM 11/09/2009  . LEUKOPENIA, CHRONIC 07/28/2008    Past Surgical History:  Procedure Laterality Date  . BUNIONECTOMY      Social History   Socioeconomic History  . Marital status: Married    Spouse name: Not on file  . Number of children: Not on file  . Years of education: Not on file  . Highest education level: Not on file  Social Needs  . Financial resource strain: Not on file  . Food insecurity - worry: Not on file  . Food insecurity - inability: Not on file  . Transportation needs - medical: Not on file  . Transportation needs - non-medical: Not on file  Occupational History  . 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 Smoking status: Never Smoker  . Smokeless tobacco: Never Used  Substance and Sexual Activity  . Alcohol use: No    Alcohol/week: 0.0 oz  . Drug use: No  . Sexual activity: Not on file  Other Topics Concern  . Not on file  Social History Narrative   Originally from [place] (in [place] since 1996)    Current Outpatient Medications on File Prior to Visit  Medication Sig  Dispense Refill  . aspirin 81 MG tablet Take 81 mg by mouth daily.      . Insulin Syringe-Needle U-100 (BD INSULIN SYRINGE ULTRAFINE) 31G X 5/16" 0.5 ML MISC 1 each by Does not apply route daily. 100 each 1  . losartan (COZAAR) 25 MG tablet TAKE ONE TABLET BY MOUTH EVERY DAY 30 tablet 1  . Multiple Vitamins-Minerals (CENTRUM SILVER ULTRA MENS) TABS Take 1 tablet by mouth daily.      . ONE TOUCH ULTRA TEST test strip USE ONE STRIP TO CHECK GLUCOSE TWICE DAILY 100 each 12  . ONE TOUCH ULTRA TEST test strip Use to check blood sugar 2 times per day. 50 each 3  . sildenafil (REVATIO) 20 MG tablet take 1-5 tablets AS NEEDED FOR erectlie dysfunction 50 tablet 11  . SURE COMFORT INSULIN SYRINGE 31G X 5/16" 1 ML MISC TEST UP TO QUD 100 each 1   No current facility-administered medications on file prior to visit.     No Known Allergies  Family History  Problem Relation Age of Onset  . Diabetes Brother        Type 2  . Colon cancer Neg Hx        patient dose not know much about family    BP 138/68 (BP Location: Left Arm, Patient Position: Sitting, Cuff Size:  Normal)   Pulse 91   Wt 190 lb (86.2 kg)   SpO2 97%   BMI 32.11 kg/m   Review of Systems Denies LOC.  He continues to have intermitt drainage from a pustule at the right buttock.     Objective:   Physical Exam VITAL SIGNS:  See vs page GENERAL: no distress Right buttock: 1-2 cm area of slight swelling, but no drainage.  Pulses: foot pulses are intact bilaterally.   MSK: no deformity of the feet or ankles.  CV: no edema of the legs or ankles Skin:  no ulcer on the feet or ankles.  normal color and temp on the feet and ankles Neuro: sensation is intact to touch on the feet and ankles.     Lab Results  Component Value Date   HGBA1C 8.0 07/25/2017      Assessment & Plan:  Insulin-requiring type 2 DM: he needs increased rx Dyslipidemia: he would benefit from lipitor Pustule, new to me.   Patient Instructions  Please  increase the insulin to 100 units with breakfast, and 20 units with the evening meal. I have sent a prescription to your pharmacy, for an antibiotic pill to take each day I have sent a prescription to your pharmacy, for the cholesterol.  check your blood sugar twice a day.  vary the time of day when you check, between before the 3 meals, and at bedtime.  also check if you have symptoms of your blood sugar being too high or too low.  please keep a record of the readings and bring it to your next appointment here (or you can bring the meter itself).  You can write it on any piece of paper.  please call us sooner if your blood sugar goes below 70, or if you have a lot of readings over 200. Please come back for a follow-up appointment in 2 months.

## 2017-07-25 NOTE — Patient Instructions (Addendum)
Please increase the insulin to 100 units with breakfast, and 20 units with the evening meal. I have sent a prescription to your pharmacy, for an antibiotic pill to take each day I have sent a prescription to your pharmacy, for the cholesterol.  check your blood sugar twice a day.  vary the time of day when you check, between before the 3 meals, and at bedtime.  also check if you have symptoms of your blood sugar being too high or too low.  please keep a record of the readings and bring it to your next appointment here (or you can bring the meter itself).  You can write it on any piece of paper.  please call us sooner if your blood sugar goes below 70, or if you have a lot of readings over 200. Please come back for a follow-up appointment in 2 months.

## 2017-07-31 ENCOUNTER — Other Ambulatory Visit: Payer: Self-pay | Admitting: Endocrinology

## 2017-09-08 ENCOUNTER — Other Ambulatory Visit: Payer: Self-pay | Admitting: Endocrinology

## 2017-09-26 ENCOUNTER — Ambulatory Visit: Payer: PPO | Admitting: Endocrinology

## 2017-09-26 ENCOUNTER — Encounter: Payer: Self-pay | Admitting: Endocrinology

## 2017-09-26 ENCOUNTER — Ambulatory Visit (INDEPENDENT_AMBULATORY_CARE_PROVIDER_SITE_OTHER): Payer: PPO | Admitting: Endocrinology

## 2017-09-26 ENCOUNTER — Ambulatory Visit
Admission: RE | Admit: 2017-09-26 | Discharge: 2017-09-26 | Disposition: A | Discharge status: Home / Self Care | Payer: PPO | Source: Ambulatory Visit | Attending: Endocrinology | Admitting: Endocrinology

## 2017-09-26 VITALS — BP 146/68 | HR 89 | Wt 189.5 lb

## 2017-09-26 DIAGNOSIS — R102 Pelvic and perineal pain: Secondary | ICD-10-CM | POA: Diagnosis not present

## 2017-09-26 DIAGNOSIS — Z794 Long term (current) use of insulin: Secondary | ICD-10-CM

## 2017-09-26 DIAGNOSIS — M25552 Pain in left hip: Secondary | ICD-10-CM | POA: Diagnosis not present

## 2017-09-26 DIAGNOSIS — E119 Type 2 diabetes mellitus without complications: Secondary | ICD-10-CM | POA: Diagnosis not present

## 2017-09-26 LAB — URINALYSIS, ROUTINE W REFLEX MICROSCOPIC
BILIRUBIN URINE: NEGATIVE
HGB URINE DIPSTICK: NEGATIVE
Ketones, ur: NEGATIVE
LEUKOCYTES UA: NEGATIVE
Nitrite: NEGATIVE
RBC / HPF: NONE SEEN (ref 0–?)
Specific Gravity, Urine: 1.015 (ref 1.000–1.030)
TOTAL PROTEIN, URINE-UPE24: NEGATIVE
URINE GLUCOSE: 500 — AB
UROBILINOGEN UA: 0.2 (ref 0.0–1.0)
pH: 6 (ref 5.0–8.0)

## 2017-09-26 LAB — POCT GLYCOSYLATED HEMOGLOBIN (HGB A1C): HEMOGLOBIN A1C: 8.6

## 2017-09-26 MED ORDER — INSULIN NPH (HUMAN) (ISOPHANE) 100 UNIT/ML ~~LOC~~ SUSP: SUBCUTANEOUS | 11 refills | Status: DC

## 2017-09-26 NOTE — Patient Instructions (Addendum)
Please change the insulin to NPH, 100 units with breakfast, and 20 units with the evening meal.  urine test and x-rays are requested for you today.  We'll let you know about the results. I hope you feel better soon.  If you don't feel better by next week, please call back.  Please call sooner if you get worse. check your blood sugar twice a day.  vary the time of day when you check, between before the 3 meals, and at bedtime.  also check if you have symptoms of your blood sugar being too high or too low.  please keep a record of the readings and bring it to your next appointment here (or you can bring the meter itself).  You can write it on any piece of paper.  please call us sooner if your blood sugar goes below 70, or if you have a lot of readings over 200.  Please come back for a follow-up appointment in 2 months.

## 2017-09-26 NOTE — Progress Notes (Signed)
Subjective:    Patient ID: Albert Reed, male    DOB: 1951/07/16, 67 y.o.   MRN: [id number]  HPI Pt returns for f/u of diabetes mellitus: DM type: Insulin-requiring type 2.  Dx'ed: 1997.  Complications: none.  Therapy: insulin since 2013.  DKA: never.  Severe hypoglycemia: never.  Pancreatitis: never.  Other: he takes human insulin, due to cost. He declines multiple daily injections.   Interval history: He takes 90 units qam and 25 units qpm.  He brings a record of his cbg's which I have reviewed today.  It varies from 66-200.  It is lowest at lunch, and next lowest fasting.   Pt states 1 week of moderate pain at the left lower back, but no assoc hematuria.  He is unable to cite precip factor, such as injury.   Past Medical History:  Diagnosis Date  . COLONIC POLYPS, HX OF 04/01/2007  . DIVERTICULOSIS, COLON 04/01/2007  . DM 11/24/2008  . HYPERCHOLESTEROLEMIA 07/28/2008  . HYPERTENSION 04/01/2007  . HYPERTHYROIDISM 11/09/2009  . LEUKOPENIA, CHRONIC 07/28/2008    Past Surgical History:  Procedure Laterality Date  . BUNIONECTOMY      Social History   Socioeconomic History  . Marital status: Married    Spouse name: Not on file  . Number of children: Not on file  . Years of education: Not on file  . Highest education level: Not on file  Social Needs  . Financial resource strain: Not on file  . Food insecurity - worry: Not on file  . Food insecurity - inability: Not on file  . Transportation needs - medical: Not on file  . Transportation needs - non-medical: Not on file  Occupational History  . 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 Smoking status: Never Smoker  . Smokeless tobacco: Never Used  Substance and Sexual Activity  . Alcohol use: No    Alcohol/week: 0.0 oz  . Drug use: No  . Sexual activity: Not on file  Other Topics Concern  . Not on file  Social History Narrative   Originally from [place] (in  [place] since 1996)    Current Outpatient Medications on File Prior to Visit  Medication Sig Dispense Refill  . aspirin 81 MG tablet Take 81 mg by mouth daily.      Marland Kitchen atorvastatin (LIPITOR) 10 MG tablet Take 1 tablet (10 mg total) by mouth daily. 90 tablet 3  . doxycycline (VIBRA-TABS) 100 MG tablet Take 1 tablet (100 mg total) by mouth daily. 90 tablet 3  . IBUPROFEN PO Take 200 mg by mouth.    . Insulin Syringe-Needle U-100 (BD INSULIN SYRINGE ULTRAFINE) 31G X 5/16" 0.5 ML MISC 1 each by Does not apply route daily. 100 each 1  . losartan (COZAAR) 25 MG tablet TAKE ONE TABLET BY MOUTH EVERY DAY 30 tablet 1  . Multiple Vitamins-Minerals (CENTRUM SILVER ULTRA MENS) TABS Take 1 tablet by mouth daily.      . ONE TOUCH ULTRA TEST test strip USE ONE STRIP TO CHECK GLUCOSE TWICE DAILY 100 each 12  . ONE TOUCH ULTRA TEST test strip Use to check blood sugar 2 times per day. 100 each 11  . sildenafil (REVATIO) 20 MG tablet take 1-5 tablets AS NEEDED FOR erectlie dysfunction 50 tablet 11  . SURE COMFORT INSULIN SYRINGE 31G X 5/16" 1 ML MISC TEST UP TO QUD 100 each 1   No current  facility-administered medications on file prior to visit.     No Known Allergies  Family History  Problem Relation Age of Onset  . Diabetes Brother        Type 2  . Colon cancer Neg Hx        patient dose not know much about family    BP (!) 146/68 (BP Location: Right Arm, Patient Position: Sitting, Cuff Size: Normal)   Pulse 89   Wt 189 lb 8 oz (86 kg)   SpO2 98%   BMI 32.03 kg/m   Review of Systems Denies numbness and dysuria.     Objective:   Physical Exam VITAL SIGNS:  See vs page GENERAL: no distress Pelvis, post left aspect: nontender Pulses: dorsalis pedis intact bilat.   MSK: no deformity of the feet CV: trace bilat leg edema Skin:  no ulcer on the feet.  normal color and temp on the feet. Neuro: sensation is intact to touch on the feet  A1c=8.6%    Assessment & Plan:  Insulin-requiring type 2  DM: he needs increased rx Low back/pelvic pain, new, uncertain etiology  Patient Instructions  Please change the insulin to NPH, 100 units with breakfast, and 20 units with the evening meal.  urine test and x-rays are requested for you today.  We'll let you know about the results. I hope you feel better soon.  If you don't feel better by next week, please call back.  Please call sooner if you get worse. check your blood sugar twice a day.  vary the time of day when you check, between before the 3 meals, and at bedtime.  also check if you have symptoms of your blood sugar being too high or too low.  please keep a record of the readings and bring it to your next appointment here (or you can bring the meter itself).  You can write it on any piece of paper.  please call us sooner if your blood sugar goes below 70, or if you have a lot of readings over 200.  Please come back for a follow-up appointment in 2 months.

## 2017-09-29 ENCOUNTER — Telehealth: Payer: Self-pay | Admitting: Endocrinology

## 2017-09-29 NOTE — Telephone Encounter (Signed)
Patient stated he is calling for his test result. Please advise  Didn't understand what test results he was trying to tell me.

## 2017-09-29 NOTE — Telephone Encounter (Signed)
LVM that results were normal & they were also sent to patient via mychart message.

## 2017-10-17 ENCOUNTER — Other Ambulatory Visit: Payer: Self-pay | Admitting: Endocrinology

## 2017-11-03 ENCOUNTER — Other Ambulatory Visit: Payer: Self-pay | Admitting: Endocrinology

## 2017-11-27 ENCOUNTER — Encounter: Payer: Self-pay | Admitting: Endocrinology

## 2017-11-27 ENCOUNTER — Ambulatory Visit (INDEPENDENT_AMBULATORY_CARE_PROVIDER_SITE_OTHER): Payer: PPO | Admitting: Endocrinology

## 2017-11-27 VITALS — BP 122/62 | HR 93 | Wt 192.8 lb

## 2017-11-27 DIAGNOSIS — Z23 Encounter for immunization: Secondary | ICD-10-CM | POA: Diagnosis not present

## 2017-11-27 DIAGNOSIS — Z794 Long term (current) use of insulin: Secondary | ICD-10-CM

## 2017-11-27 DIAGNOSIS — Z0000 Encounter for general adult medical examination without abnormal findings: Secondary | ICD-10-CM

## 2017-11-27 DIAGNOSIS — E119 Type 2 diabetes mellitus without complications: Secondary | ICD-10-CM

## 2017-11-27 LAB — POCT GLYCOSYLATED HEMOGLOBIN (HGB A1C): HEMOGLOBIN A1C: 8.5

## 2017-11-27 MED ORDER — INSULIN NPH ISOPHANE & REGULAR (70-30) 100 UNIT/ML ~~LOC~~ SUSP: SUBCUTANEOUS | 11 refills | Status: DC

## 2017-11-27 NOTE — Progress Notes (Signed)
we discussed code status.  pt requests full code, but would not want to be started or maintained on artificial life-support measures if there was not a reasonable chance of recovery 

## 2017-11-27 NOTE — Progress Notes (Signed)
Subjective:    Patient ID: Albert Reed, male    DOB: [birth year], 67 y.o.   MRN: [id number]  HPI Pt returns for f/u of diabetes mellitus: DM type: Insulin-requiring type 2.  Dx'ed: 1997.  Complications: none.  Therapy: insulin since 2013.  DKA: never.  Severe hypoglycemia: never.  Pancreatitis: never.  Other: he takes human insulin, due to cost. He declines multiple daily injections.   Interval history: He takes 100 units qam and 20 units qpm.  He brings a record of his cbg's which I have reviewed today.  It varies from 66-370.  It is in general higher as the day goes on, but not necessarily so.  Past Medical History:  Diagnosis Date  . COLONIC POLYPS, HX OF 04/01/2007  . DIVERTICULOSIS, COLON 04/01/2007  . DM 11/24/2008  . HYPERCHOLESTEROLEMIA 07/28/2008  . HYPERTENSION 04/01/2007  . HYPERTHYROIDISM 11/09/2009  . LEUKOPENIA, CHRONIC 07/28/2008    Past Surgical History:  Procedure Laterality Date  . BUNIONECTOMY      Social History   Socioeconomic History  . Marital status: Married    Spouse name: Not on file  . Number of children: Not on file  . Years of education: Not on file  . Highest education level: Not on file  Occupational History  . 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 Financial resource strain: Not on file  . Food insecurity:    Worry: Not on file    Inability: Not on file  . Transportation needs:    Medical: Not on file    Non-medical: Not on file  Tobacco Use  . Smoking status: Never Smoker  . Smokeless tobacco: Never Used  Substance and Sexual Activity  . Alcohol use: No    Alcohol/week: 0.0 oz  . Drug use: No  . Sexual activity: Not on file  Lifestyle  . Physical activity:    Days per week: Not on file    Minutes per session: Not on file  . Stress: Not on file  Relationships  . Social connections:    Talks on phone: Not on file    Gets together: Not on file    Attends religious  service: Not on file    Active member of club or organization: Not on file    Attends meetings of clubs or organizations: Not on file    Relationship status: Not on file  . Intimate partner violence:    Fear of current or ex partner: Not on file    Emotionally abused: Not on file    Physically abused: Not on file    Forced sexual activity: Not on file  Other Topics Concern  . Not on file  Social History Narrative   Originally from [place] (in [place] since [place])    Current Outpatient Medications on File Prior to Visit  Medication Sig Dispense Refill  . aspirin 81 MG tablet Take 81 mg by mouth daily.      Marland Kitchen doxycycline (VIBRA-TABS) 100 MG tablet Take 1 tablet (100 mg total) by mouth daily. 90 tablet 3  . IBUPROFEN PO Take 200 mg by mouth.    . insulin NPH Human (NOVOLIN N) 100 UNIT/ML injection 100 units each morning, and 20 units each evening 40 mL 11  . Insulin Syringe-Needle U-100 (BD INSULIN SYRINGE ULTRAFINE) 31G X 5/16" 0.5 ML MISC 1 each by Does not apply route daily. 100 each 1  .  losartan (COZAAR) 25 MG tablet TAKE ONE TABLET BY MOUTH EVERY DAY 30 tablet 1  . Multiple Vitamins-Minerals (CENTRUM SILVER ULTRA MENS) TABS Take 1 tablet by mouth daily.      . ONE TOUCH ULTRA TEST test strip USE ONE STRIP TO CHECK GLUCOSE TWICE DAILY 100 each 12  . ONE TOUCH ULTRA TEST test strip Use to check blood sugar 2 times per day. 100 each 11  . sildenafil (REVATIO) 20 MG tablet take 1-5 tablets AS NEEDED FOR erectlie dysfunction 50 tablet 11  . SURE COMFORT INSULIN SYRINGE 31G X 5/16" 1 ML MISC TEST UP TO QUD 100 each 1  . atorvastatin (LIPITOR) 10 MG tablet Take 1 tablet (10 mg total) by mouth daily. (Patient not taking: Reported on 11/27/2017) 90 tablet 3   No current facility-administered medications on file prior to visit.     No Known Allergies  Family History  Problem Relation Age of Onset  . Diabetes Brother        Type 2  . Colon cancer Neg Hx        patient dose not know much  about family    BP 122/62 (BP Location: Left Arm, Patient Position: Sitting, Cuff Size: Normal)   Pulse 93   Wt 192 lb 12.8 oz (87.5 kg)   SpO2 97%   BMI 32.58 kg/m    Review of Systems He denies hypoglycemia    Objective:   Physical Exam VITAL SIGNS:  See vs page GENERAL: no distress Pulses: dorsalis pedis intact bilat.   MSK: no deformity of the feet CV: trace bilat leg edema Skin:  no ulcer on the feet.  normal color and temp on the feet. Neuro: sensation is intact to touch on the feet   Lab Results  Component Value Date   HGBA1C 8.5 11/27/2017      Assessment & Plan:  Insulin-requiring type 2 DM: he needs increased rx.    Subjective:   Patient here for Medicare annual wellness visit and management of other chronic and acute problems.     Risk factors: advanced age    18 of Physicians Providing Medical Care to Patient:  See "snapshot"   Activities of Daily Living: In your present state of health, do you have any difficulty performing the following activities (lives with wife)?:  Preparing food and eating?: No  Bathing yourself: No  Getting dressed: No  Using the toilet: No  Moving around from place to place: No  In the past year have you fallen or had a near fall?: No    Home Safety: Has smoke detector and wears seat belts. No firearms.   Opioid Use: none  Diet and Exercise  Current exercise habits: pt says good Dietary issues discussed: pt reports a fairly healthy diet   Depression Screen  Q1: Over the past two weeks, have you felt down, depressed or hopeless? no  Q2: Over the past two weeks, have you felt little interest or pleasure in doing things? no   The following portions of the patient's history were reviewed and updated as appropriate: allergies, current medications, past family history, past medical history, past social history, past surgical history and problem list.   Review of Systems  Denies hearing loss, and visual loss.    Objective:   Vision:  Advertising account executive, so he declines VA today Hearing: grossly normal Body mass index:  See vs page Msk: pt easily and quickly performs "get-up-and-go" from a sitting position Cognitive Impairment Assessment: cognition,  memory and judgment appear normal.  remembers 2/3 at 5 minutes.  excellent recall.  can easily read and write a sentence.  alert and oriented x 3.     Assessment:   Medicare wellness utd on preventive parameters    Plan:   During the course of the visit the patient was educated and counseled about appropriate screening and preventive services including:        Fall prevention is advised today  Diabetes screening is UTD Nutrition counseling is offered  advanced directives/end of life addressed today:  see healthcare directives hyperlink  Vaccines are updated as needed  Patient Instructions (the written plan) was given to the patient.

## 2017-11-27 NOTE — Patient Instructions (Addendum)
Please change the insulin to NPH, 110 units with breakfast, and 15 units with the evening meal.  check your blood sugar twice a day.  vary the time of day when you check, between before the 3 meals, and at bedtime.  also check if you have symptoms of your blood sugar being too high or too low.  please keep a record of the readings and bring it to your next appointment here (or you can bring the meter itself).  You can write it on any piece of paper.  please call us sooner if your blood sugar goes below 70, or if you have a lot of readings over 200.   good diet and exercise significantly improve the control of your diabetes.  please let me know if you wish to be referred to a dietician.   Please consider these measures for your health:  minimize alcohol.  Do not use tobacco products.  Have a colonoscopy at least every 10 years from age 37.  Keep firearms safely stored.  Always use seat belts.  have working smoke alarms in your home.  See an eye doctor and dentist regularly.  Never drive under the influence of alcohol or drugs (including prescription drugs).   It is critically important to prevent falling down (keep floor areas well-lit, dry, and free of loose objects.  If you have a cane, walker, or wheelchair, you should use it, even for short trips around the house.  Wear flat-soled shoes.  Also, try not to rush) Please come back for a follow-up appointment in 3 months.

## 2018-01-05 ENCOUNTER — Other Ambulatory Visit: Payer: Self-pay | Admitting: Endocrinology

## 2018-03-04 ENCOUNTER — Ambulatory Visit: Payer: PPO | Admitting: Endocrinology

## 2018-03-04 ENCOUNTER — Encounter: Payer: Self-pay | Admitting: Endocrinology

## 2018-03-04 VITALS — BP 120/58 | HR 97 | Ht 64.5 in | Wt 189.0 lb

## 2018-03-04 DIAGNOSIS — E119 Type 2 diabetes mellitus without complications: Secondary | ICD-10-CM | POA: Diagnosis not present

## 2018-03-04 DIAGNOSIS — Z794 Long term (current) use of insulin: Secondary | ICD-10-CM

## 2018-03-04 LAB — POCT GLYCOSYLATED HEMOGLOBIN (HGB A1C): Hemoglobin A1C: 8.6 % — AB (ref 4.0–5.6)

## 2018-03-04 MED ORDER — INSULIN NPH ISOPHANE & REGULAR (70-30) 100 UNIT/ML ~~LOC~~ SUSP: SUBCUTANEOUS | 11 refills | Status: DC

## 2018-03-04 NOTE — Patient Instructions (Addendum)
Please increase the morning insulin to 110 units.  On this type of insulin schedule, you should eat meals on a regular schedule.  If a meal is missed or significantly delayed, your blood sugar could go low.   check your blood sugar twice a day.  vary the time of day when you check, between before the 3 meals, and at bedtime.  also check if you have symptoms of your blood sugar being too high or too low.  please keep a record of the readings and bring it to your next appointment here (or you can bring the meter itself).  You can write it on any piece of paper.  please call us sooner if your blood sugar goes below 70, or if you have a lot of readings over 200.  Please come back for a follow-up appointment in 3 months.

## 2018-03-04 NOTE — Progress Notes (Signed)
Subjective:    Patient ID: Albert Reed, male    DOB: Dec 23, 1950, 67 y.o.   MRN: [id number]  HPI Pt returns for f/u of diabetes mellitus: DM type: Insulin-requiring type 2.  Dx'ed: 1997.  Complications: none.  Therapy: insulin since 2013.  DKA: never.  Severe hypoglycemia: never.  Pancreatitis: never.  Other: he takes human insulin, due to cost. He declines multiple daily injections.   Interval history: He takes 100 units qam and 20 units qpm.  He brings a record of his cbg's which I have reviewed today.  It varies from 58-370.  There is no trend throughout the day, but is is lowest after a meal is delayed.   Past Medical History:  Diagnosis Date  . COLONIC POLYPS, HX OF 04/01/2007  . DIVERTICULOSIS, COLON 04/01/2007  . DM 11/24/2008  . HYPERCHOLESTEROLEMIA 07/28/2008  . HYPERTENSION 04/01/2007  . HYPERTHYROIDISM 11/09/2009  . LEUKOPENIA, CHRONIC 07/28/2008    Past Surgical History:  Procedure Laterality Date  . BUNIONECTOMY      Social History   Socioeconomic History  . Marital status: Married    Spouse name: Not on file  . Number of children: Not on file  . Years of education: Not on file  . Highest education level: Not on file  Occupational History  . 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 Financial resource strain: Not on file  . Food insecurity:    Worry: Not on file    Inability: Not on file  . Transportation needs:    Medical: Not on file    Non-medical: Not on file  Tobacco Use  . Smoking status: Never Smoker  . Smokeless tobacco: Never Used  Substance and Sexual Activity  . Alcohol use: No    Alcohol/week: 0.0 oz  . Drug use: No  . Sexual activity: Not on file  Lifestyle  . Physical activity:    Days per week: Not on file    Minutes per session: Not on file  . Stress: Not on file  Relationships  . Social connections:    Talks on phone: Not on file    Gets together: Not on file   Attends religious service: Not on file    Active member of club or organization: Not on file    Attends meetings of clubs or organizations: Not on file    Relationship status: Not on file  . Intimate partner violence:    Fear of current or ex partner: Not on file    Emotionally abused: Not on file    Physically abused: Not on file    Forced sexual activity: Not on file  Other Topics Concern  . Not on file  Social History Narrative   Originally from [place] (in [place] since [place])    Current Outpatient Medications on File Prior to Visit  Medication Sig Dispense Refill  . aspirin 81 MG tablet Take 81 mg by mouth daily.      Marland Kitchen atorvastatin (LIPITOR) 10 MG tablet Take 1 tablet (10 mg total) by mouth daily. 90 tablet 3  . doxycycline (VIBRA-TABS) 100 MG tablet Take 1 tablet (100 mg total) by mouth daily. 90 tablet 3  . IBUPROFEN PO Take 200 mg by mouth.    . insulin NPH Human (NOVOLIN N) 100 UNIT/ML injection 100 units each morning, and 20 units each evening 40 mL 11  . Insulin Syringe-Needle U-100 (BD INSULIN SYRINGE  ULTRAFINE) 31G X 5/16" 0.5 ML MISC 1 each by Does not apply route daily. 100 each 1  . Multiple Vitamins-Minerals (CENTRUM SILVER ULTRA MENS) TABS Take 1 tablet by mouth daily.      . ONE TOUCH ULTRA TEST test strip USE ONE STRIP TO CHECK GLUCOSE TWICE DAILY 100 each 12  . ONE TOUCH ULTRA TEST test strip Use to check blood sugar 2 times per day. 100 each 11  . sildenafil (REVATIO) 20 MG tablet take 1-5 tablets AS NEEDED FOR erectlie dysfunction 50 tablet 11  . SURE COMFORT INSULIN SYRINGE 31G X 5/16" 1 ML MISC TEST UP TO QUD 100 each 1   No current facility-administered medications on file prior to visit.     No Known Allergies  Family History  Problem Relation Age of Onset  . Diabetes Brother        Type 2  . Colon cancer Neg Hx        patient dose not know much about family    BP (!) 120/58 (BP Location: Left Arm, Patient Position: Sitting, Cuff Size: Normal)   Pulse  97   Ht 5' 4.5" (1.638 m)   Wt 189 lb (85.7 kg)   SpO2 95%   BMI 31.94 kg/m    Review of Systems He has lost a few lbs.      Objective:   Physical Exam VITAL SIGNS:  See vs page GENERAL: no distress Pulses: dorsalis pedis intact bilat.   MSK: no deformity of the feet CV: trace bilat leg edema.   Skin:  no ulcer on the feet.  normal color and temp on the feet. Neuro: sensation is intact to touch on the feet.    Lab Results  Component Value Date   HGBA1C 8.6 (A) 03/04/2018       Assessment & Plan:  Insulin-requiring type 2 DM: he needs increased rx Hypoglycemia: mild: I told pt it is really important that meals not be delayed.   Patient Instructions  Please increase the morning insulin to 110 units.  On this type of insulin schedule, you should eat meals on a regular schedule.  If a meal is missed or significantly delayed, your blood sugar could go low.   check your blood sugar twice a day.  vary the time of day when you check, between before the 3 meals, and at bedtime.  also check if you have symptoms of your blood sugar being too high or too low.  please keep a record of the readings and bring it to your next appointment here (or you can bring the meter itself).  You can write it on any piece of paper.  please call us sooner if your blood sugar goes below 70, or if you have a lot of readings over 200.  Please come back for a follow-up appointment in 3 months.

## 2018-03-05 ENCOUNTER — Other Ambulatory Visit: Payer: Self-pay | Admitting: Endocrinology

## 2018-04-09 ENCOUNTER — Other Ambulatory Visit: Payer: Self-pay | Admitting: Endocrinology

## 2018-04-30 ENCOUNTER — Other Ambulatory Visit: Payer: Self-pay | Admitting: Endocrinology

## 2018-06-25 DIAGNOSIS — H25813 Combined forms of age-related cataract, bilateral: Secondary | ICD-10-CM | POA: Diagnosis not present

## 2018-06-25 DIAGNOSIS — H40053 Ocular hypertension, bilateral: Secondary | ICD-10-CM | POA: Diagnosis not present

## 2018-06-26 ENCOUNTER — Other Ambulatory Visit: Payer: Self-pay | Admitting: Endocrinology

## 2018-07-03 DIAGNOSIS — H2511 Age-related nuclear cataract, right eye: Secondary | ICD-10-CM | POA: Diagnosis not present

## 2018-07-03 DIAGNOSIS — H25811 Combined forms of age-related cataract, right eye: Secondary | ICD-10-CM | POA: Diagnosis not present

## 2018-07-08 ENCOUNTER — Ambulatory Visit: Payer: PPO | Admitting: Endocrinology

## 2018-07-10 ENCOUNTER — Encounter: Payer: Self-pay | Admitting: Endocrinology

## 2018-07-10 ENCOUNTER — Ambulatory Visit (INDEPENDENT_AMBULATORY_CARE_PROVIDER_SITE_OTHER): Payer: PPO | Admitting: Endocrinology

## 2018-07-10 VITALS — BP 138/68 | HR 97 | Ht 64.5 in | Wt 191.0 lb

## 2018-07-10 DIAGNOSIS — E119 Type 2 diabetes mellitus without complications: Secondary | ICD-10-CM | POA: Diagnosis not present

## 2018-07-10 DIAGNOSIS — Z23 Encounter for immunization: Secondary | ICD-10-CM | POA: Diagnosis not present

## 2018-07-10 DIAGNOSIS — Z794 Long term (current) use of insulin: Secondary | ICD-10-CM

## 2018-07-10 LAB — POCT GLYCOSYLATED HEMOGLOBIN (HGB A1C): HEMOGLOBIN A1C: 9 % — AB (ref 4.0–5.6)

## 2018-07-10 MED ORDER — INSULIN NPH ISOPHANE & REGULAR (70-30) 100 UNIT/ML ~~LOC~~ SUSP: SUBCUTANEOUS | 11 refills | Status: DC

## 2018-07-10 NOTE — Patient Instructions (Addendum)
Please increase the insulin to 130 units with breakfast, and 10 units with the evening meal.  Please take this no matter what your blood sugar is.  On this type of insulin schedule, you should eat meals on a regular schedule.  If a meal is missed or significantly delayed, your blood sugar could go low.   check your blood sugar twice a day.  vary the time of day when you check, between before the 3 meals, and at bedtime.  also check if you have symptoms of your blood sugar being too high or too low.  please keep a record of the readings and bring it to your next appointment here (or you can bring the meter itself).  You can write it on any piece of paper.  please call us sooner if your blood sugar goes below 70, or if you have a lot of readings over 200.  Please come back for a follow-up appointment in 2 months.

## 2018-07-10 NOTE — Progress Notes (Signed)
Subjective:    Patient ID: Albert Reed, male    DOB: Jul 16, 1951, 67 y.o.   MRN: [id number]  HPI Pt returns for f/u of diabetes mellitus: DM type: Insulin-requiring type 2.  Dx'ed: 1997.  Complications: none.  Therapy: insulin since 2013.  DKA: never.  Severe hypoglycemia: never.  Pancreatitis: never.  Other: he takes human insulin, due to cost. He declines multiple daily injections.   Interval history: He takes 110 units qam and 15 units qpm.  no cbg record, but states cbg varies from 66-200.  It is in general higher as the day goes on.  He seldom misses the insulin Past Medical History:  Diagnosis Date  . COLONIC POLYPS, HX OF 04/01/2007  . DIVERTICULOSIS, COLON 04/01/2007  . DM 11/24/2008  . HYPERCHOLESTEROLEMIA 07/28/2008  . HYPERTENSION 04/01/2007  . HYPERTHYROIDISM 11/09/2009  . LEUKOPENIA, CHRONIC 07/28/2008    Past Surgical History:  Procedure Laterality Date  . BUNIONECTOMY      Social History   Socioeconomic History  . Marital status: Married    Spouse name: Not on file  . Number of children: Not on file  . Years of education: Not on file  . Highest education level: Not on file  Occupational History  . 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 Financial resource strain: Not on file  . Food insecurity:    Worry: Not on file    Inability: Not on file  . Transportation needs:    Medical: Not on file    Non-medical: Not on file  Tobacco Use  . Smoking status: Never Smoker  . Smokeless tobacco: Never Used  Substance and Sexual Activity  . Alcohol use: No    Alcohol/week: 0.0 standard drinks  . Drug use: No  . Sexual activity: Not on file  Lifestyle  . Physical activity:    Days per week: Not on file    Minutes per session: Not on file  . Stress: Not on file  Relationships  . Social connections:    Talks on phone: Not on file    Gets together: Not on file    Attends religious service: Not on  file    Active member of club or organization: Not on file    Attends meetings of clubs or organizations: Not on file    Relationship status: Not on file  . Intimate partner violence:    Fear of current or ex partner: Not on file    Emotionally abused: Not on file    Physically abused: Not on file    Forced sexual activity: Not on file  Other Topics Concern  . Not on file  Social History Narrative   Originally from [place] (in [place] since [place])    Current Outpatient Medications on File Prior to Visit  Medication Sig Dispense Refill  . aspirin 81 MG tablet Take 81 mg by mouth daily.      Marland Kitchen atorvastatin (LIPITOR) 10 MG tablet Take 1 tablet (10 mg total) by mouth daily. 90 tablet 3  . doxycycline (VIBRA-TABS) 100 MG tablet Take 1 tablet (100 mg total) by mouth daily. 90 tablet 3  . IBUPROFEN PO Take 200 mg by mouth.    . Insulin Syringe-Needle U-100 (BD INSULIN SYRINGE ULTRAFINE) 31G X 5/16" 0.5 ML MISC 1 each by Does not apply route daily. 100 each 1  . losartan (COZAAR) 25 MG tablet TAKE ONE TABLET BY  MOUTH EVERY DAY 30 tablet 1  . Multiple Vitamins-Minerals (CENTRUM SILVER ULTRA MENS) TABS Take 1 tablet by mouth daily.      . ONE TOUCH ULTRA TEST test strip USE ONE STRIP TO CHECK GLUCOSE TWICE DAILY 100 each 12  . ONE TOUCH ULTRA TEST test strip Use to check blood sugar 2 times per day. 100 each 11  . sildenafil (REVATIO) 20 MG tablet take 1-5 tablets AS NEEDED FOR erectlie dysfunction 50 tablet 11  . SURE COMFORT INSULIN SYRINGE 31G X 5/16" 1 ML MISC TEST UP TO QUD 100 each 1   No current facility-administered medications on file prior to visit.     No Known Allergies  Family History  Problem Relation Age of Onset  . Diabetes Brother        Type 2  . Colon cancer Neg Hx        patient dose not know much about family    BP 138/68 (BP Location: Right Arm, Patient Position: Sitting, Cuff Size: Normal)   Pulse 97   Ht 5' 4.5" (1.638 m)   Wt 191 lb (86.6 kg)   SpO2 93%   BMI  32.28 kg/m    Review of Systems Denies LOC    Objective:   Physical Exam VITAL SIGNS:  See vs page GENERAL: no distress Pulses: dorsalis pedis intact bilat.   MSK: no deformity of the feet CV: no leg edema Skin:  no ulcer on the feet.  normal color and temp on the feet. Neuro: sensation is intact to touch on the feet  Lab Results  Component Value Date   HGBA1C 9.0 (A) 07/10/2018        Assessment & Plan:  Insulin-requiring type 2 DM: he needs increased rx   Patient Instructions  Please increase the insulin to 130 units with breakfast, and 10 units with the evening meal.  Please take this no matter what your blood sugar is.  On this type of insulin schedule, you should eat meals on a regular schedule.  If a meal is missed or significantly delayed, your blood sugar could go low.   check your blood sugar twice a day.  vary the time of day when you check, between before the 3 meals, and at bedtime.  also check if you have symptoms of your blood sugar being too high or too low.  please keep a record of the readings and bring it to your next appointment here (or you can bring the meter itself).  You can write it on any piece of paper.  please call us sooner if your blood sugar goes below 70, or if you have a lot of readings over 200.  Please come back for a follow-up appointment in 2 months.

## 2018-07-29 DIAGNOSIS — H34231 Retinal artery branch occlusion, right eye: Secondary | ICD-10-CM | POA: Diagnosis not present

## 2018-07-29 DIAGNOSIS — H43813 Vitreous degeneration, bilateral: Secondary | ICD-10-CM | POA: Diagnosis not present

## 2018-07-29 DIAGNOSIS — E113413 Type 2 diabetes mellitus with severe nonproliferative diabetic retinopathy with macular edema, bilateral: Secondary | ICD-10-CM | POA: Diagnosis not present

## 2018-08-05 DIAGNOSIS — E113413 Type 2 diabetes mellitus with severe nonproliferative diabetic retinopathy with macular edema, bilateral: Secondary | ICD-10-CM | POA: Diagnosis not present

## 2018-08-05 DIAGNOSIS — H43813 Vitreous degeneration, bilateral: Secondary | ICD-10-CM | POA: Diagnosis not present

## 2018-08-09 ENCOUNTER — Encounter: Payer: Self-pay | Admitting: Gastroenterology

## 2018-08-20 ENCOUNTER — Other Ambulatory Visit: Payer: Self-pay | Admitting: Endocrinology

## 2018-09-09 ENCOUNTER — Encounter: Payer: Self-pay | Admitting: Endocrinology

## 2018-09-09 ENCOUNTER — Ambulatory Visit (INDEPENDENT_AMBULATORY_CARE_PROVIDER_SITE_OTHER): Payer: PPO | Admitting: Endocrinology

## 2018-09-09 VITALS — BP 124/50 | HR 101 | Ht 64.5 in | Wt 194.0 lb

## 2018-09-09 DIAGNOSIS — Z794 Long term (current) use of insulin: Secondary | ICD-10-CM

## 2018-09-09 DIAGNOSIS — E119 Type 2 diabetes mellitus without complications: Secondary | ICD-10-CM | POA: Diagnosis not present

## 2018-09-09 LAB — POCT GLYCOSYLATED HEMOGLOBIN (HGB A1C): HEMOGLOBIN A1C: 8.5 % — AB (ref 4.0–5.6)

## 2018-09-09 MED ORDER — INSULIN REGULAR HUMAN 100 UNIT/ML IJ SOLN: INTRAMUSCULAR | 11 refills | Status: DC

## 2018-09-09 MED ORDER — INSULIN NPH (HUMAN) (ISOPHANE) 100 UNIT/ML ~~LOC~~ SUSP: 90.0000 [IU] | SUBCUTANEOUS | 11 refills | Status: DC

## 2018-09-09 NOTE — Progress Notes (Signed)
Subjective:    Patient ID: Albert Reed, male    DOB: 08/29/1950, 68 y.o.   MRN: [id number]  HPI Pt returns for f/u of diabetes mellitus: DM type: Insulin-requiring type 2.  Dx'ed: 1997.  Complications: none.  Therapy: insulin since 2013.  DKA: never.  Severe hypoglycemia: never.  Pancreatitis: never.  Other: he takes human insulin, due to cost. He declines multiple daily injections.   Interval history: He takes 110 units qam and 15 units qpm.  He had to reduce am insulin due to mild hypoglycemia at lunch.  Meter is downloaded today, and the printout is scanned into the record. He checks fasting and at HS.  cbg varies from 70-400.  is highest at HS.   He seldom misses the insulin Past Medical History:  Diagnosis Date  . COLONIC POLYPS, HX OF 04/01/2007  . DIVERTICULOSIS, COLON 04/01/2007  . DM 11/24/2008  . HYPERCHOLESTEROLEMIA 07/28/2008  . HYPERTENSION 04/01/2007  . HYPERTHYROIDISM 11/09/2009  . LEUKOPENIA, CHRONIC 07/28/2008    Past Surgical History:  Procedure Laterality Date  . BUNIONECTOMY      Social History   Socioeconomic History  . Marital status: Married    Spouse name: Not on file  . Number of children: Not on file  . Years of education: Not on file  . Highest education level: Not on file  Occupational History  . 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 Financial resource strain: Not on file  . Food insecurity:    Worry: Not on file    Inability: Not on file  . Transportation needs:    Medical: Not on file    Non-medical: Not on file  Tobacco Use  . Smoking status: Never Smoker  . Smokeless tobacco: Never Used  Substance and Sexual Activity  . Alcohol use: No    Alcohol/week: 0.0 standard drinks  . Drug use: No  . Sexual activity: Not on file  Lifestyle  . Physical activity:    Days per week: Not on file    Minutes per session: Not on file  . Stress: Not on file  Relationships  . Social  connections:    Talks on phone: Not on file    Gets together: Not on file    Attends religious service: Not on file    Active member of club or organization: Not on file    Attends meetings of clubs or organizations: Not on file    Relationship status: Not on file  . Intimate partner violence:    Fear of current or ex partner: Not on file    Emotionally abused: Not on file    Physically abused: Not on file    Forced sexual activity: Not on file  Other Topics Concern  . Not on file  Social History Narrative   Originally from [place] (in [place] since [place]    Current Outpatient Medications on File Prior to Visit  Medication Sig Dispense Refill  . aspirin 81 MG tablet Take 81 mg by mouth daily.      Marland Kitchen atorvastatin (LIPITOR) 10 MG tablet Take 1 tablet (10 mg total) by mouth daily. 90 tablet 3  . doxycycline (VIBRA-TABS) 100 MG tablet Take 1 tablet (100 mg total) by mouth daily. 90 tablet 3  . IBUPROFEN PO Take 200 mg by mouth.    . Insulin Syringe-Needle U-100 (BD INSULIN SYRINGE ULTRAFINE) 31G X 5/16" 0.5 ML MISC  1 each by Does not apply route daily. 100 each 1  . losartan (COZAAR) 25 MG tablet TAKE ONE TABLET BY MOUTH EVERY DAY 30 tablet 1  . Multiple Vitamins-Minerals (CENTRUM SILVER ULTRA MENS) TABS Take 1 tablet by mouth daily.      . ONE TOUCH ULTRA TEST test strip USE ONE STRIP TO CHECK GLUCOSE TWICE DAILY 100 each 12  . ONE TOUCH ULTRA TEST test strip Use to check blood sugar 2 times per day. 100 each 11  . sildenafil (REVATIO) 20 MG tablet take 1-5 tablets AS NEEDED FOR erectlie dysfunction 50 tablet 11  . SURE COMFORT INSULIN SYRINGE 31G X 5/16" 1 ML MISC TEST UP TO QUD 100 each 1   No current facility-administered medications on file prior to visit.     No Known Allergies  Family History  Problem Relation Age of Onset  . Diabetes Brother        Type 2  . Colon cancer Neg Hx        patient dose not know much about family    BP (!) 124/50 (BP Location: Left Arm, Patient  Position: Sitting, Cuff Size: Large)   Pulse (!) 101   Ht 5' 4.5" (1.638 m)   Wt 194 lb (88 kg)   SpO2 90%   BMI 32.79 kg/m    Review of Systems He denies LOC.      Objective:   Physical Exam VITAL SIGNS:  See vs page GENERAL: no distress Pulses: dorsalis pedis intact bilat.   MSK: no deformity of the feet CV: 1+ bilat leg edema Skin:  no ulcer on the feet.  normal color and temp on the feet. Neuro: sensation is intact to touch on the feet  Lab Results  Component Value Date   HGBA1C 8.5 (A) 09/09/2018        Assessment & Plan:  Insulin-requiring type 2 DM: The pattern of his cbg's indicates he needs some adjustment in his therapy   Patient Instructions  Please change to the insulins listed below.   On this type of insulin schedule, you should eat meals on a regular schedule.  If a meal is missed or significantly delayed, your blood sugar could go low.   check your blood sugar twice a day.  vary the time of day when you check, between before the 3 meals, and at bedtime.  also check if you have symptoms of your blood sugar being too high or too low.  please keep a record of the readings and bring it to your next appointment here (or you can bring the meter itself).  You can write it on any piece of paper.  please call us sooner if your blood sugar goes below 70, or if you have a lot of readings over 200.  Please come back for a follow-up appointment in 2 months.

## 2018-09-09 NOTE — Patient Instructions (Addendum)
Please change to the insulins listed below.   On this type of insulin schedule, you should eat meals on a regular schedule.  If a meal is missed or significantly delayed, your blood sugar could go low.   check your blood sugar twice a day.  vary the time of day when you check, between before the 3 meals, and at bedtime.  also check if you have symptoms of your blood sugar being too high or too low.  please keep a record of the readings and bring it to your next appointment here (or you can bring the meter itself).  You can write it on any piece of paper.  please call us sooner if your blood sugar goes below 70, or if you have a lot of readings over 200.  Please come back for a follow-up appointment in 2 months.

## 2018-09-22 ENCOUNTER — Other Ambulatory Visit: Payer: Self-pay | Admitting: Endocrinology

## 2018-11-11 ENCOUNTER — Ambulatory Visit: Payer: PPO | Admitting: Endocrinology

## 2018-11-12 ENCOUNTER — Other Ambulatory Visit: Payer: Self-pay

## 2018-11-12 ENCOUNTER — Other Ambulatory Visit: Payer: Self-pay | Admitting: Endocrinology

## 2018-11-13 ENCOUNTER — Other Ambulatory Visit: Payer: Self-pay

## 2018-11-13 ENCOUNTER — Ambulatory Visit (INDEPENDENT_AMBULATORY_CARE_PROVIDER_SITE_OTHER): Payer: PPO | Admitting: Endocrinology

## 2018-11-13 ENCOUNTER — Encounter: Payer: Self-pay | Admitting: Endocrinology

## 2018-11-13 VITALS — BP 136/70 | HR 94 | Temp 97.7°F | Ht 64.5 in | Wt 193.6 lb

## 2018-11-13 DIAGNOSIS — E119 Type 2 diabetes mellitus without complications: Secondary | ICD-10-CM | POA: Diagnosis not present

## 2018-11-13 DIAGNOSIS — Z794 Long term (current) use of insulin: Secondary | ICD-10-CM

## 2018-11-13 LAB — POCT GLYCOSYLATED HEMOGLOBIN (HGB A1C): HEMOGLOBIN A1C: 7.9 % — AB (ref 4.0–5.6)

## 2018-11-13 MED ORDER — INSULIN NPH (HUMAN) (ISOPHANE) 100 UNIT/ML ~~LOC~~ SUSP: 85.0000 [IU] | SUBCUTANEOUS | 11 refills | Status: DC

## 2018-11-13 MED ORDER — INSULIN REGULAR HUMAN 100 UNIT/ML IJ SOLN: INTRAMUSCULAR | 11 refills | Status: DC

## 2018-11-13 NOTE — Progress Notes (Signed)
Subjective:    Patient ID: Albert Reed, male    DOB: 1951-08-09, 68 y.o.   MRN: [id number]  HPI Pt returns for f/u of diabetes mellitus: DM type: Insulin-requiring type 2.  Dx'ed: 1997.  Complications: none.  Therapy: insulin since 2013.  DKA: never.  Severe hypoglycemia: never.  Pancreatitis: never.  Other: he takes human insulin, due to cost. He declines multiple daily injections.   Interval history: Meter is downloaded today, and the printout is scanned into the record.  cbg varies from 52-411.  is in general highest at HS.   He seldom misses the insulin.   Past Medical History:  Diagnosis Date  . COLONIC POLYPS, HX OF 04/01/2007  . DIVERTICULOSIS, COLON 04/01/2007  . DM 11/24/2008  . HYPERCHOLESTEROLEMIA 07/28/2008  . HYPERTENSION 04/01/2007  . HYPERTHYROIDISM 11/09/2009  . LEUKOPENIA, CHRONIC 07/28/2008    Past Surgical History:  Procedure Laterality Date  . BUNIONECTOMY      Social History   Socioeconomic History  . Marital status: Married    Spouse name: Not on file  . Number of children: Not on file  . Years of education: Not on file  . Highest education level: Not on file  Occupational History  . 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 Financial resource strain: Not on file  . Food insecurity:    Worry: Not on file    Inability: Not on file  . Transportation needs:    Medical: Not on file    Non-medical: Not on file  Tobacco Use  . Smoking status: Never Smoker  . Smokeless tobacco: Never Used  Substance and Sexual Activity  . Alcohol use: No    Alcohol/week: 0.0 standard drinks  . Drug use: No  . Sexual activity: Not on file  Lifestyle  . Physical activity:    Days per week: Not on file    Minutes per session: Not on file  . Stress: Not on file  Relationships  . Social connections:    Talks on phone: Not on file    Gets together: Not on file    Attends religious service: Not on file     Active member of club or organization: Not on file    Attends meetings of clubs or organizations: Not on file    Relationship status: Not on file  . Intimate partner violence:    Fear of current or ex partner: Not on file    Emotionally abused: Not on file    Physically abused: Not on file    Forced sexual activity: Not on file  Other Topics Concern  . Not on file  Social History Narrative   Originally from [place] (in [place] since [place])    Current Outpatient Medications on File Prior to Visit  Medication Sig Dispense Refill  . aspirin 81 MG tablet Take 81 mg by mouth daily.      Marland Kitchen atorvastatin (LIPITOR) 10 MG tablet Take 1 tablet (10 mg total) by mouth daily. 90 tablet 3  . doxycycline (VIBRA-TABS) 100 MG tablet Take 1 tablet (100 mg total) by mouth daily. 90 tablet 3  . IBUPROFEN PO Take 200 mg by mouth.    . Insulin Syringe-Needle U-100 (BD INSULIN SYRINGE ULTRAFINE) 31G X 5/16" 0.5 ML MISC 1 each by Does not apply route daily. 100 each 1  . losartan (COZAAR) 25 MG tablet TAKE ONE TABLET BY MOUTH EVERY DAY  30 tablet 1  . Multiple Vitamins-Minerals (CENTRUM SILVER ULTRA MENS) TABS Take 1 tablet by mouth daily.      . ONE TOUCH ULTRA TEST test strip USE ONE STRIP TO CHECK GLUCOSE TWICE DAILY 100 each 12  . ONE TOUCH ULTRA TEST test strip Use to check blood sugar 2 times per day. 100 each 11  . sildenafil (REVATIO) 20 MG tablet take 1-5 tablets AS NEEDED FOR erectlie dysfunction 50 tablet 11  . SURE COMFORT INSULIN SYRINGE 31G X 5/16" 1 ML MISC TEST UP TO QUD 100 each 1   No current facility-administered medications on file prior to visit.     No Known Allergies  Family History  Problem Relation Age of Onset  . Diabetes Brother        Type 2  . Colon cancer Neg Hx        patient dose not know much about family    BP 136/70 (BP Location: Left Arm, Patient Position: Sitting, Cuff Size: Normal)   Pulse 94   Temp 97.7 F (36.5 C) (Oral)   Ht 5' 4.5" (1.638 m)   Wt 193 lb 9.6  oz (87.8 kg)   SpO2 98%   BMI 32.72 kg/m     Review of Systems Denies LOC.  He has gained weight.      Objective:   Physical Exam VITAL SIGNS:  See vs page GENERAL: no distress Pulses: dorsalis pedis intact bilat.   MSK: no deformity of the feet CV: no leg edema Skin:  no ulcer on the feet.  normal color and temp on the feet. Neuro: sensation is intact to touch on the feet   Lab Results  Component Value Date   HGBA1C 7.9 (A) 11/13/2018       Assessment & Plan:  Insulin-requiring type 2 DM: The pattern of his cbg's indicates he needs some adjustment in his therapy Obesity: worse:  We discussed. The best way I can manage this is to adjust insulin to his needs.  He cannot afford brand name meds, that would address this. Hypoglycemia: This limits rx options  Patient Instructions  Please change to the insulins to the numbers listed below.   On this type of insulin schedule, you should eat meals on a regular schedule.  If a meal is missed or significantly delayed, your blood sugar could go low.   check your blood sugar twice a day.  vary the time of day when you check, between before the 3 meals, and at bedtime.  also check if you have symptoms of your blood sugar being too high or too low.  please keep a record of the readings and bring it to your next appointment here (or you can bring the meter itself).  You can write it on any piece of paper.  please call us sooner if your blood sugar goes below 70, or if you have a lot of readings over 200.  Please come back for a follow-up appointment in 2 months.

## 2018-11-13 NOTE — Patient Instructions (Addendum)
Please change to the insulins to the numbers listed below.   On this type of insulin schedule, you should eat meals on a regular schedule.  If a meal is missed or significantly delayed, your blood sugar could go low.   check your blood sugar twice a day.  vary the time of day when you check, between before the 3 meals, and at bedtime.  also check if you have symptoms of your blood sugar being too high or too low.  please keep a record of the readings and bring it to your next appointment here (or you can bring the meter itself).  You can write it on any piece of paper.  please call us sooner if your blood sugar goes below 70, or if you have a lot of readings over 200.  Please come back for a follow-up appointment in 2 months.

## 2018-11-20 DIAGNOSIS — M542 Cervicalgia: Secondary | ICD-10-CM | POA: Diagnosis not present

## 2018-11-20 DIAGNOSIS — E119 Type 2 diabetes mellitus without complications: Secondary | ICD-10-CM | POA: Diagnosis not present

## 2018-11-20 DIAGNOSIS — I10 Essential (primary) hypertension: Secondary | ICD-10-CM | POA: Diagnosis not present

## 2019-01-01 DIAGNOSIS — E113413 Type 2 diabetes mellitus with severe nonproliferative diabetic retinopathy with macular edema, bilateral: Secondary | ICD-10-CM | POA: Diagnosis not present

## 2019-01-01 DIAGNOSIS — H25811 Combined forms of age-related cataract, right eye: Secondary | ICD-10-CM | POA: Diagnosis not present

## 2019-01-01 DIAGNOSIS — Z01818 Encounter for other preprocedural examination: Secondary | ICD-10-CM | POA: Diagnosis not present

## 2019-01-01 DIAGNOSIS — H25812 Combined forms of age-related cataract, left eye: Secondary | ICD-10-CM | POA: Diagnosis not present

## 2019-01-01 DIAGNOSIS — H2512 Age-related nuclear cataract, left eye: Secondary | ICD-10-CM | POA: Diagnosis not present

## 2019-01-04 ENCOUNTER — Other Ambulatory Visit: Payer: Self-pay | Admitting: Endocrinology

## 2019-01-04 NOTE — Telephone Encounter (Signed)
Please forward refill request to pt's new primary care provider.  

## 2019-01-15 ENCOUNTER — Ambulatory Visit: Payer: PPO | Admitting: Endocrinology

## 2019-01-20 ENCOUNTER — Ambulatory Visit (INDEPENDENT_AMBULATORY_CARE_PROVIDER_SITE_OTHER): Payer: PPO | Admitting: Endocrinology

## 2019-01-20 ENCOUNTER — Other Ambulatory Visit: Payer: Self-pay

## 2019-01-20 ENCOUNTER — Encounter: Payer: Self-pay | Admitting: Endocrinology

## 2019-01-20 VITALS — BP 130/68 | HR 99 | Temp 98.0°F | Wt 194.6 lb

## 2019-01-20 DIAGNOSIS — Z794 Long term (current) use of insulin: Secondary | ICD-10-CM | POA: Diagnosis not present

## 2019-01-20 DIAGNOSIS — E119 Type 2 diabetes mellitus without complications: Secondary | ICD-10-CM

## 2019-01-20 DIAGNOSIS — Z9119 Patient's noncompliance with other medical treatment and regimen: Secondary | ICD-10-CM

## 2019-01-20 DIAGNOSIS — R609 Edema, unspecified: Secondary | ICD-10-CM | POA: Diagnosis not present

## 2019-01-20 LAB — POCT GLYCOSYLATED HEMOGLOBIN (HGB A1C): Hemoglobin A1C: 8.8 % — AB (ref 4.0–5.6)

## 2019-01-20 MED ORDER — ONETOUCH DELICA LANCETS 33G MISC: 11 refills | Status: DC

## 2019-01-20 MED ORDER — INSULIN REGULAR HUMAN 100 UNIT/ML IJ SOLN: INTRAMUSCULAR | 11 refills | Status: DC

## 2019-01-20 MED ORDER — GLUCOSE BLOOD VI STRP: ORAL_STRIP | 12 refills | Status: DC

## 2019-01-20 NOTE — Patient Instructions (Addendum)
Please change the insulin to just before breakfast and supper.   On this type of insulin schedule, you should eat meals on a regular schedule.  If a meal is missed or significantly delayed, your blood sugar could go low.   check your blood sugar twice a day.  vary the time of day when you check, between before the 3 meals, and at bedtime.  also check if you have symptoms of your blood sugar being too high or too low.  please keep a record of the readings and bring it to your next appointment here (or you can bring the meter itself).  You can write it on any piece of paper.  please call us sooner if your blood sugar goes below 70, or if you have a lot of readings over 200.  Please come back for a follow-up appointment in 2 months.

## 2019-01-20 NOTE — Progress Notes (Signed)
New meter given to patient today in office.  OneTouch Verio Flex  Lot: [id number] X Exp 10/17/2019

## 2019-01-20 NOTE — Progress Notes (Signed)
Subjective:    Patient ID: Albert Reed, male    DOB: October 03, 1950, 68 y.o.   MRN: [id number]  HPI Pt returns for f/u of diabetes mellitus: DM type: Insulin-requiring type 2.  Dx'ed: 1997.  Complications: none.  Therapy: insulin since 2013.  DKA: never.  Severe hypoglycemia: never.  Pancreatitis: never.  Other: he takes human insulin, due to cost. He declines multiple daily injections.   Interval history: Meter is downloaded today, and the printout is scanned into the record.  cbg varies from 76-377.  it is in general highest at HS, but he does not take the PM insulin until HS.  He seldom misses the insulin.   Past Medical History:  Diagnosis Date  . COLONIC POLYPS, HX OF 04/01/2007  . DIVERTICULOSIS, COLON 04/01/2007  . DM 11/24/2008  . HYPERCHOLESTEROLEMIA 07/28/2008  . HYPERTENSION 04/01/2007  . HYPERTHYROIDISM 11/09/2009  . LEUKOPENIA, CHRONIC 07/28/2008    Past Surgical History:  Procedure Laterality Date  . BUNIONECTOMY      Social History   Socioeconomic History  . Marital status: Married    Spouse name: Not on file  . Number of children: Not on file  . Years of education: Not on file  . Highest education level: Not on file  Occupational History  . 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 Financial resource strain: Not on file  . Food insecurity:    Worry: Not on file    Inability: Not on file  . Transportation needs:    Medical: Not on file    Non-medical: Not on file  Tobacco Use  . Smoking status: Never Smoker  . Smokeless tobacco: Never Used  Substance and Sexual Activity  . Alcohol use: No    Alcohol/week: 0.0 standard drinks  . Drug use: No  . Sexual activity: Not on file  Lifestyle  . Physical activity:    Days per week: Not on file    Minutes per session: Not on file  . Stress: Not on file  Relationships  . Social connections:    Talks on phone: Not on file    Gets together: Not on  file    Attends religious service: Not on file    Active member of club or organization: Not on file    Attends meetings of clubs or organizations: Not on file    Relationship status: Not on file  . Intimate partner violence:    Fear of current or ex partner: Not on file    Emotionally abused: Not on file    Physically abused: Not on file    Forced sexual activity: Not on file  Other Topics Concern  . Not on file  Social History Narrative   Originally from [place] (in [place] since 1996)    Current Outpatient Medications on File Prior to Visit  Medication Sig Dispense Refill  . aspirin 81 MG tablet Take 81 mg by mouth daily.      Marland Kitchen atorvastatin (LIPITOR) 10 MG tablet Take 1 tablet (10 mg total) by mouth daily. 90 tablet 3  . Blood Glucose Monitoring Suppl (ONETOUCH VERIO FLEX SYSTEM) w/Device KIT by Does not apply route. Use OneTouch Verio Flex as directed to check blood sugars daily    . doxycycline (VIBRA-TABS) 100 MG tablet Take 1 tablet (100 mg total) by mouth daily. 90 tablet 3  . IBUPROFEN PO Take 200 mg by mouth.    Marland Kitchen  Insulin Syringe-Needle U-100 (BD INSULIN SYRINGE ULTRAFINE) 31G X 5/16" 0.5 ML MISC 1 each by Does not apply route daily. 100 each 1  . losartan (COZAAR) 25 MG tablet TAKE ONE TABLET BY MOUTH EVERY DAY 30 tablet 1  . Multiple Vitamins-Minerals (CENTRUM SILVER ULTRA MENS) TABS Take 1 tablet by mouth daily.      . sildenafil (REVATIO) 20 MG tablet take 1-5 tablets AS NEEDED FOR erectlie dysfunction 50 tablet 11  . SURE COMFORT INSULIN SYRINGE 31G X 5/16" 1 ML MISC TEST UP TO QUD 100 each 1   No current facility-administered medications on file prior to visit.     No Known Allergies  Family History  Problem Relation Age of Onset  . Diabetes Brother        Type 2  . Colon cancer Neg Hx        patient dose not know much about family    BP 130/68 (BP Location: Left Arm, Patient Position: Sitting, Cuff Size: Normal)   Pulse 99   Temp 98 F (36.7 C) (Oral)   Wt  194 lb 9.6 oz (88.3 kg)   SpO2 98%   BMI 32.89 kg/m    Review of Systems He denies hypoglycemia    Objective:   Physical Exam VITAL SIGNS:  See vs page GENERAL: no distress Pulses: dorsalis pedis intact bilat.   MSK: no deformity of the feet CV: 1+ bilat leg edema Skin:  no ulcer on the feet.  normal color and temp on the feet. Neuro: sensation is intact to touch on the feet   Lab Results  Component Value Date   HGBA1C 8.8 (A) 01/20/2019       Assessment & Plan:  Insulin-requiring type 2 DM: worse Noncompliance with insulin dose timing.  We discussed.  Edema: This limits rx options   Patient Instructions  Please change the insulin to just before breakfast and supper.   On this type of insulin schedule, you should eat meals on a regular schedule.  If a meal is missed or significantly delayed, your blood sugar could go low.   check your blood sugar twice a day.  vary the time of day when you check, between before the 3 meals, and at bedtime.  also check if you have symptoms of your blood sugar being too high or too low.  please keep a record of the readings and bring it to your next appointment here (or you can bring the meter itself).  You can write it on any piece of paper.  please call us sooner if your blood sugar goes below 70, or if you have a lot of readings over 200.  Please come back for a follow-up appointment in 2 months.

## 2019-01-22 DIAGNOSIS — H25812 Combined forms of age-related cataract, left eye: Secondary | ICD-10-CM | POA: Diagnosis not present

## 2019-01-22 DIAGNOSIS — H2512 Age-related nuclear cataract, left eye: Secondary | ICD-10-CM | POA: Diagnosis not present

## 2019-02-24 ENCOUNTER — Encounter: Payer: Self-pay | Admitting: Endocrinology

## 2019-02-24 NOTE — Telephone Encounter (Signed)
Please advise 

## 2019-02-26 DIAGNOSIS — I10 Essential (primary) hypertension: Secondary | ICD-10-CM | POA: Diagnosis not present

## 2019-02-26 DIAGNOSIS — E119 Type 2 diabetes mellitus without complications: Secondary | ICD-10-CM | POA: Diagnosis not present

## 2019-02-26 DIAGNOSIS — Z125 Encounter for screening for malignant neoplasm of prostate: Secondary | ICD-10-CM | POA: Diagnosis not present

## 2019-03-01 DIAGNOSIS — I10 Essential (primary) hypertension: Secondary | ICD-10-CM | POA: Diagnosis not present

## 2019-03-01 DIAGNOSIS — Z125 Encounter for screening for malignant neoplasm of prostate: Secondary | ICD-10-CM | POA: Diagnosis not present

## 2019-03-01 DIAGNOSIS — E119 Type 2 diabetes mellitus without complications: Secondary | ICD-10-CM | POA: Diagnosis not present

## 2019-03-08 ENCOUNTER — Encounter: Payer: Self-pay | Admitting: Endocrinology

## 2019-03-09 ENCOUNTER — Other Ambulatory Visit: Payer: Self-pay | Admitting: Endocrinology

## 2019-03-09 MED ORDER — INSULIN REGULAR HUMAN 100 UNIT/ML IJ SOLN: INTRAMUSCULAR | 11 refills | Status: DC

## 2019-03-09 NOTE — Telephone Encounter (Signed)
Please review requested dosages and advise

## 2019-03-25 ENCOUNTER — Other Ambulatory Visit: Payer: Self-pay | Admitting: Endocrinology

## 2019-03-25 NOTE — Telephone Encounter (Signed)
Please advise if you wish to manage and refill this Rx

## 2019-03-25 NOTE — Telephone Encounter (Signed)
Please forward refill request to pt's new primary care provider.  

## 2019-04-09 ENCOUNTER — Ambulatory Visit: Payer: PPO | Admitting: Endocrinology

## 2019-04-22 DIAGNOSIS — H4312 Vitreous hemorrhage, left eye: Secondary | ICD-10-CM | POA: Diagnosis not present

## 2019-04-22 DIAGNOSIS — E113592 Type 2 diabetes mellitus with proliferative diabetic retinopathy without macular edema, left eye: Secondary | ICD-10-CM | POA: Diagnosis not present

## 2019-04-23 ENCOUNTER — Other Ambulatory Visit: Payer: Self-pay

## 2019-04-23 ENCOUNTER — Ambulatory Visit (INDEPENDENT_AMBULATORY_CARE_PROVIDER_SITE_OTHER): Payer: PPO | Admitting: Endocrinology

## 2019-04-23 ENCOUNTER — Encounter: Payer: Self-pay | Admitting: Endocrinology

## 2019-04-23 VITALS — BP 142/80 | HR 92 | Temp 98.7°F | Ht 64.5 in | Wt 195.2 lb

## 2019-04-23 DIAGNOSIS — E119 Type 2 diabetes mellitus without complications: Secondary | ICD-10-CM | POA: Diagnosis not present

## 2019-04-23 DIAGNOSIS — Z23 Encounter for immunization: Secondary | ICD-10-CM | POA: Diagnosis not present

## 2019-04-23 DIAGNOSIS — Z794 Long term (current) use of insulin: Secondary | ICD-10-CM

## 2019-04-23 LAB — POCT GLYCOSYLATED HEMOGLOBIN (HGB A1C): Hemoglobin A1C: 8.6 % — AB (ref 4.0–5.6)

## 2019-04-23 MED ORDER — INSULIN REGULAR HUMAN 100 UNIT/ML IJ SOLN: INTRAMUSCULAR | 11 refills | Status: DC

## 2019-04-23 NOTE — Patient Instructions (Addendum)
Please change the insulin to 50 units just before breakfast, 10 units with lunch, and 45 units with supper. check your blood sugar twice a day.  vary the time of day when you check, between before the 3 meals, and at bedtime.  also check if you have symptoms of your blood sugar being too high or too low.  please keep a record of the readings and bring it to your next appointment here (or you can bring the meter itself).  You can write it on any piece of paper.  please call us sooner if your blood sugar goes below 70, or if you have a lot of readings over 200.  Please come back for a follow-up appointment in 2 months.

## 2019-04-23 NOTE — Progress Notes (Signed)
Subjective:    Patient ID: Albert Reed, male    DOB: Aug 31, 1950, 68 y.o.   MRN: [id number]  HPI Pt returns for f/u of diabetes mellitus:   DM type: Insulin-requiring type 2.  Dx'ed: 1997.  Complications: none.  Therapy: insulin since 2013.  DKA: never.  Severe hypoglycemia: never.  Pancreatitis: never.  Other: he takes human insulin, due to cost. He declines multiple daily injections.    Interval history: Meter is downloaded today, and the printout is scanned into the record.  cbg varies from 60-353.  it is in general lowest after breakfast, and then increases up to HS.  He seldom misses the insulin.   Past Medical History:  Diagnosis Date  . COLONIC POLYPS, HX OF 04/01/2007  . DIVERTICULOSIS, COLON 04/01/2007  . DM 11/24/2008  . HYPERCHOLESTEROLEMIA 07/28/2008  . HYPERTENSION 04/01/2007  . HYPERTHYROIDISM 11/09/2009  . LEUKOPENIA, CHRONIC 07/28/2008    Past Surgical History:  Procedure Laterality Date  . BUNIONECTOMY      Social History   Socioeconomic History  . Marital status: Married    Spouse name: Not on file  . Number of children: Not on file  . Years of education: Not on file  . Highest education level: Not on file  Occupational History  . 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 Financial resource strain: Not on file  . Food insecurity    Worry: Not on file    Inability: Not on file  . Transportation needs    Medical: Not on file    Non-medical: Not on file  Tobacco Use  . Smoking status: Never Smoker  . Smokeless tobacco: Never Used  Substance and Sexual Activity  . Alcohol use: No    Alcohol/week: 0.0 standard drinks  . Drug use: No  . Sexual activity: Not on file  Lifestyle  . Physical activity    Days per week: Not on file    Minutes per session: Not on file  . Stress: Not on file  Relationships  . Social Herbalist on phone: Not on file    Gets together: Not on file   Attends religious service: Not on file    Active member of club or organization: Not on file    Attends meetings of clubs or organizations: Not on file    Relationship status: Not on file  . Intimate partner violence    Fear of current or ex partner: Not on file    Emotionally abused: Not on file    Physically abused: Not on file    Forced sexual activity: Not on file  Other Topics Concern  . Not on file  Social History Narrative   Originally from [place] (in [place] since 1996)    Current Outpatient Medications on File Prior to Visit  Medication Sig Dispense Refill  . aspirin 81 MG tablet Take 81 mg by mouth daily.      Marland Kitchen atorvastatin (LIPITOR) 10 MG tablet Take 1 tablet (10 mg total) by mouth daily. 90 tablet 3  . Blood Glucose Monitoring Suppl (ONETOUCH VERIO FLEX SYSTEM) w/Device KIT by Does not apply route. Use OneTouch Verio Flex as directed to check blood sugars daily    . doxycycline (VIBRA-TABS) 100 MG tablet Take 1 tablet (100 mg total) by mouth daily. 90 tablet 3  . glucose blood test strip Use One Touch Verio test strip as instructed  100 each 12  . IBUPROFEN PO Take 200 mg by mouth.    . Insulin Syringe-Needle U-100 (BD INSULIN SYRINGE ULTRAFINE) 31G X 5/16" 0.5 ML MISC 1 each by Does not apply route daily. 100 each 1  . losartan (COZAAR) 25 MG tablet TAKE ONE TABLET BY MOUTH EVERY DAY 30 tablet 1  . Multiple Vitamins-Minerals (CENTRUM SILVER ULTRA MENS) TABS Take 1 tablet by mouth daily.      Glory Rosebush Delica Lancets 82M MISC Use with OneTouch Verio as directed/instructed 100 each 11  . sildenafil (REVATIO) 20 MG tablet take 1-5 tablets AS NEEDED FOR erectlie dysfunction 50 tablet 11  . SURE COMFORT INSULIN SYRINGE 31G X 5/16" 1 ML MISC TEST UP TO QUD 100 each 1   No current facility-administered medications on file prior to visit.     No Known Allergies  Family History  Problem Relation Age of Onset  . Diabetes Brother        Type 2  . Colon cancer Neg Hx         patient dose not know much about family    BP (!) 142/80   Pulse 92   Temp 98.7 F (37.1 C) (Oral)   Ht 5' 4.5" (1.638 m)   Wt 195 lb 3.2 oz (88.5 kg)   SpO2 98%   BMI 32.99 kg/m    Review of Systems Denies LOC.      Objective:   Physical Exam VITAL SIGNS:  See vs page GENERAL: no distress Pulses: dorsalis pedis intact bilat.   MSK: no deformity of the feet CV: 1+ bilat leg edema Skin:  no ulcer on the feet.  normal color and temp on the feet. Neuro: sensation is intact to touch on the feet.    A1c=8.6%    Assessment & Plan:  Insulin-requiring type 2 DM: he needs increased rx.  we discussed the fact that the pattern of cbg's is really better for multiple daily injections.  He agrees to take insulin at lunch also.   Hypoglycemia: this limits aggressiveness of glycemic control.    Patient Instructions  Please change the insulin to 50 units just before breakfast, 10 units with lunch, and 45 units with supper. check your blood sugar twice a day.  vary the time of day when you check, between before the 3 meals, and at bedtime.  also check if you have symptoms of your blood sugar being too high or too low.  please keep a record of the readings and bring it to your next appointment here (or you can bring the meter itself).  You can write it on any piece of paper.  please call us sooner if your blood sugar goes below 70, or if you have a lot of readings over 200.  Please come back for a follow-up appointment in 2 months.

## 2019-04-27 DIAGNOSIS — H35371 Puckering of macula, right eye: Secondary | ICD-10-CM | POA: Diagnosis not present

## 2019-04-27 DIAGNOSIS — E113391 Type 2 diabetes mellitus with moderate nonproliferative diabetic retinopathy without macular edema, right eye: Secondary | ICD-10-CM | POA: Diagnosis not present

## 2019-04-27 DIAGNOSIS — H4312 Vitreous hemorrhage, left eye: Secondary | ICD-10-CM | POA: Diagnosis not present

## 2019-04-27 DIAGNOSIS — E113592 Type 2 diabetes mellitus with proliferative diabetic retinopathy without macular edema, left eye: Secondary | ICD-10-CM | POA: Diagnosis not present

## 2019-05-05 ENCOUNTER — Ambulatory Visit (HOSPITAL_COMMUNITY): Payer: PPO | Attending: Cardiology

## 2019-05-05 ENCOUNTER — Other Ambulatory Visit (HOSPITAL_COMMUNITY): Payer: Self-pay | Admitting: Ophthalmology

## 2019-05-05 ENCOUNTER — Ambulatory Visit (HOSPITAL_COMMUNITY): Admission: RE | Admit: 2019-05-05 | Payer: PPO | Source: Ambulatory Visit

## 2019-05-05 ENCOUNTER — Other Ambulatory Visit: Payer: Self-pay

## 2019-05-05 DIAGNOSIS — I361 Nonrheumatic tricuspid (valve) insufficiency: Secondary | ICD-10-CM | POA: Insufficient documentation

## 2019-05-05 DIAGNOSIS — H539 Unspecified visual disturbance: Secondary | ICD-10-CM

## 2019-05-05 DIAGNOSIS — H4312 Vitreous hemorrhage, left eye: Secondary | ICD-10-CM

## 2019-05-05 DIAGNOSIS — E119 Type 2 diabetes mellitus without complications: Secondary | ICD-10-CM | POA: Insufficient documentation

## 2019-05-12 ENCOUNTER — Ambulatory Visit (HOSPITAL_COMMUNITY)
Admission: RE | Admit: 2019-05-12 | Discharge: 2019-05-12 | Disposition: A | Discharge status: Home / Self Care | Payer: PPO | Source: Ambulatory Visit | Attending: Cardiovascular Disease | Admitting: Cardiovascular Disease

## 2019-05-12 ENCOUNTER — Other Ambulatory Visit: Payer: Self-pay

## 2019-05-12 DIAGNOSIS — H539 Unspecified visual disturbance: Secondary | ICD-10-CM | POA: Diagnosis not present

## 2019-05-17 DIAGNOSIS — E113391 Type 2 diabetes mellitus with moderate nonproliferative diabetic retinopathy without macular edema, right eye: Secondary | ICD-10-CM | POA: Diagnosis not present

## 2019-05-17 DIAGNOSIS — H4312 Vitreous hemorrhage, left eye: Secondary | ICD-10-CM | POA: Diagnosis not present

## 2019-05-17 DIAGNOSIS — H35371 Puckering of macula, right eye: Secondary | ICD-10-CM | POA: Diagnosis not present

## 2019-05-17 DIAGNOSIS — E113592 Type 2 diabetes mellitus with proliferative diabetic retinopathy without macular edema, left eye: Secondary | ICD-10-CM | POA: Diagnosis not present

## 2019-06-08 DIAGNOSIS — E113592 Type 2 diabetes mellitus with proliferative diabetic retinopathy without macular edema, left eye: Secondary | ICD-10-CM | POA: Diagnosis not present

## 2019-06-08 DIAGNOSIS — E113391 Type 2 diabetes mellitus with moderate nonproliferative diabetic retinopathy without macular edema, right eye: Secondary | ICD-10-CM | POA: Diagnosis not present

## 2019-06-08 DIAGNOSIS — H4312 Vitreous hemorrhage, left eye: Secondary | ICD-10-CM | POA: Diagnosis not present

## 2019-06-08 DIAGNOSIS — H35371 Puckering of macula, right eye: Secondary | ICD-10-CM | POA: Diagnosis not present

## 2019-06-10 DIAGNOSIS — E113512 Type 2 diabetes mellitus with proliferative diabetic retinopathy with macular edema, left eye: Secondary | ICD-10-CM | POA: Diagnosis not present

## 2019-06-10 DIAGNOSIS — H4312 Vitreous hemorrhage, left eye: Secondary | ICD-10-CM | POA: Diagnosis not present

## 2019-06-10 DIAGNOSIS — E113592 Type 2 diabetes mellitus with proliferative diabetic retinopathy without macular edema, left eye: Secondary | ICD-10-CM | POA: Diagnosis not present

## 2019-06-11 DIAGNOSIS — E113592 Type 2 diabetes mellitus with proliferative diabetic retinopathy without macular edema, left eye: Secondary | ICD-10-CM | POA: Diagnosis not present

## 2019-06-18 DIAGNOSIS — E113592 Type 2 diabetes mellitus with proliferative diabetic retinopathy without macular edema, left eye: Secondary | ICD-10-CM | POA: Diagnosis not present

## 2019-06-21 ENCOUNTER — Other Ambulatory Visit: Payer: Self-pay

## 2019-06-23 ENCOUNTER — Ambulatory Visit (INDEPENDENT_AMBULATORY_CARE_PROVIDER_SITE_OTHER): Payer: PPO | Admitting: Endocrinology

## 2019-06-23 ENCOUNTER — Encounter: Payer: Self-pay | Admitting: Endocrinology

## 2019-06-23 VITALS — BP 136/56 | HR 104 | Ht 64.5 in | Wt 197.8 lb

## 2019-06-23 DIAGNOSIS — Z794 Long term (current) use of insulin: Secondary | ICD-10-CM

## 2019-06-23 DIAGNOSIS — E119 Type 2 diabetes mellitus without complications: Secondary | ICD-10-CM | POA: Diagnosis not present

## 2019-06-23 LAB — POCT GLYCOSYLATED HEMOGLOBIN (HGB A1C): Hemoglobin A1C: 8.5 % — AB (ref 4.0–5.6)

## 2019-06-23 MED ORDER — INSULIN REGULAR HUMAN 100 UNIT/ML IJ SOLN: INTRAMUSCULAR | 11 refills | Status: DC

## 2019-06-23 NOTE — Patient Instructions (Addendum)
Please increase the insulin to 3 times a day (just before each meal) 50-10-55 units On this type of insulin schedule, you should eat meals on a regular schedule.  If a meal is missed or significantly delayed, your blood sugar could go low.   check your blood sugar twice a day.  vary the time of day when you check, between before the 3 meals, and at bedtime.  also check if you have symptoms of your blood sugar being too high or too low.  please keep a record of the readings and bring it to your next appointment here (or you can bring the meter itself).  You can write it on any piece of paper.  please call us sooner if your blood sugar goes below 70, or if you have a lot of readings over 200.  Please come back for a follow-up appointment in 2 months.

## 2019-06-23 NOTE — Progress Notes (Signed)
Subjective:    Patient ID: Albert Reed, male    DOB: [birth year], 68 y.o.   MRN: [id number]  HPI Pt returns for f/u of diabetes mellitus:   DM type: Insulin-requiring type 2.  Dx'ed: 1997.  Complications: none.  Therapy: insulin since 2013.  DKA: never.  Severe hypoglycemia: never.  Pancreatitis: never.  Other: he takes human insulin, due to cost. He takes multiple daily injections.    Interval history: He brings his meter with his cbg's which I have reviewed today.  cbg varies from 97-381.  It is in general higher as the day goes on He seldom misses the insulin.  He takes reg insulin 3 times a day (just before each meal), 50-10-45 units.  Pt says he seldom mises the insulin.  He wants to continue multiple daily injections.  Past Medical History:  Diagnosis Date  . COLONIC POLYPS, HX OF 04/01/2007  . DIVERTICULOSIS, COLON 04/01/2007  . DM 11/24/2008  . HYPERCHOLESTEROLEMIA 07/28/2008  . HYPERTENSION 04/01/2007  . HYPERTHYROIDISM 11/09/2009  . LEUKOPENIA, CHRONIC 07/28/2008    Past Surgical History:  Procedure Laterality Date  . BUNIONECTOMY      Social History   Socioeconomic History  . Marital status: Married    Spouse name: Not on file  . Number of children: Not on file  . Years of education: Not on file  . Highest education level: Not on file  Occupational History  . 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 Financial resource strain: Not on file  . Food insecurity    Worry: Not on file    Inability: Not on file  . Transportation needs    Medical: Not on file    Non-medical: Not on file  Tobacco Use  . Smoking status: Never Smoker  . Smokeless tobacco: Never Used  Substance and Sexual Activity  . Alcohol use: No    Alcohol/week: 0.0 standard drinks  . Drug use: No  . Sexual activity: Not on file  Lifestyle  . Physical activity    Days per week: Not on file    Minutes per session: Not on file  .  Stress: Not on file  Relationships  . Social Herbalist on phone: Not on file    Gets together: Not on file    Attends religious service: Not on file    Active member of club or organization: Not on file    Attends meetings of clubs or organizations: Not on file    Relationship status: Not on file  . Intimate partner violence    Fear of current or ex partner: Not on file    Emotionally abused: Not on file    Physically abused: Not on file    Forced sexual activity: Not on file  Other Topics Concern  . Not on file  Social History Narrative   Originally from [place] (in [place] since [place])    Current Outpatient Medications on File Prior to Visit  Medication Sig Dispense Refill  . Blood Glucose Monitoring Suppl (ONETOUCH VERIO FLEX SYSTEM) w/Device KIT by Does not apply route. Use OneTouch Verio Flex as directed to check blood sugars daily    . doxycycline (VIBRA-TABS) 100 MG tablet Take 1 tablet (100 mg total) by mouth daily. 90 tablet 3  . glucose blood test strip Use One Touch Verio test strip as instructed 100 each 12  . Insulin Syringe-Needle  U-100 (BD INSULIN SYRINGE ULTRAFINE) 31G X 5/16" 0.5 ML MISC 1 each by Does not apply route daily. 100 each 1  . Multiple Vitamins-Minerals (CENTRUM SILVER ULTRA MENS) TABS Take 1 tablet by mouth daily.      Glory Rosebush Delica Lancets 13Y MISC Use with OneTouch Verio as directed/instructed 100 each 11  . aspirin 81 MG tablet Take 81 mg by mouth daily.       No current facility-administered medications on file prior to visit.     No Known Allergies  Family History  Problem Relation Age of Onset  . Diabetes Brother        Type 2  . Colon cancer Neg Hx        patient dose not know much about family    BP (!) 136/56 (BP Location: Left Arm, Patient Position: Sitting, Cuff Size: Large)   Pulse (!) 104   Ht 5' 4.5" (1.638 m)   Wt 197 lb 12.8 oz (89.7 kg)   SpO2 95%   BMI 33.43 kg/m    Review of Systems He has weight gain.       Objective:   Physical Exam VITAL SIGNS:  See vs page GENERAL: no distress Pulses: dorsalis pedis intact bilat.   MSK: no deformity of the feet  CV: no leg edema Skin:  no ulcer on the feet.  normal color and temp on the feet.  Neuro: sensation is intact to touch on the feet.  Ext: there is bilateral onychomycosis of the toenails.    Lab Results  Component Value Date   HGBA1C 8.5 (A) 06/23/2019       Assessment & Plan:  Insulin-requiring type 2 DM: he needs increased rx.  He wants to continue multiple daily injections for now   Patient Instructions  Please increase the insulin to 3 times a day (just before each meal) 50-10-55 units On this type of insulin schedule, you should eat meals on a regular schedule.  If a meal is missed or significantly delayed, your blood sugar could go low.   check your blood sugar twice a day.  vary the time of day when you check, between before the 3 meals, and at bedtime.  also check if you have symptoms of your blood sugar being too high or too low.  please keep a record of the readings and bring it to your next appointment here (or you can bring the meter itself).  You can write it on any piece of paper.  please call us sooner if your blood sugar goes below 70, or if you have a lot of readings over 200.  Please come back for a follow-up appointment in 2 months.

## 2019-07-02 ENCOUNTER — Other Ambulatory Visit: Payer: Self-pay

## 2019-07-02 DIAGNOSIS — Z20822 Contact with and (suspected) exposure to covid-19: Secondary | ICD-10-CM

## 2019-07-05 LAB — NOVEL CORONAVIRUS, NAA: SARS-CoV-2, NAA: DETECTED — AB

## 2019-07-13 DIAGNOSIS — E119 Type 2 diabetes mellitus without complications: Secondary | ICD-10-CM | POA: Diagnosis not present

## 2019-07-13 DIAGNOSIS — U071 COVID-19: Secondary | ICD-10-CM | POA: Diagnosis not present

## 2019-07-13 DIAGNOSIS — I10 Essential (primary) hypertension: Secondary | ICD-10-CM | POA: Diagnosis not present

## 2019-07-19 ENCOUNTER — Other Ambulatory Visit: Payer: Self-pay

## 2019-07-19 DIAGNOSIS — Z20822 Contact with and (suspected) exposure to covid-19: Secondary | ICD-10-CM

## 2019-07-20 LAB — NOVEL CORONAVIRUS, NAA: SARS-CoV-2, NAA: NOT DETECTED

## 2019-07-28 DIAGNOSIS — E113592 Type 2 diabetes mellitus with proliferative diabetic retinopathy without macular edema, left eye: Secondary | ICD-10-CM | POA: Diagnosis not present

## 2019-08-24 ENCOUNTER — Other Ambulatory Visit: Payer: Self-pay

## 2019-08-25 ENCOUNTER — Encounter: Payer: Self-pay | Admitting: Endocrinology

## 2019-08-25 ENCOUNTER — Ambulatory Visit (INDEPENDENT_AMBULATORY_CARE_PROVIDER_SITE_OTHER): Payer: PPO | Admitting: Endocrinology

## 2019-08-25 VITALS — BP 146/60 | HR 110 | Ht 64.5 in | Wt 194.2 lb

## 2019-08-25 DIAGNOSIS — E119 Type 2 diabetes mellitus without complications: Secondary | ICD-10-CM

## 2019-08-25 DIAGNOSIS — Z794 Long term (current) use of insulin: Secondary | ICD-10-CM

## 2019-08-25 LAB — POCT GLYCOSYLATED HEMOGLOBIN (HGB A1C): Hemoglobin A1C: 8.8 % — AB (ref 4.0–5.6)

## 2019-08-25 MED ORDER — INSULIN REGULAR HUMAN 100 UNIT/ML IJ SOLN: INTRAMUSCULAR | 11 refills | Status: DC

## 2019-08-25 NOTE — Patient Instructions (Addendum)
Please increase the insulin to 3 times a day (just before each meal) 55-15-55 units.  It is important to take this before meals.   check your blood sugar twice a day.  vary the time of day when you check, between before the 3 meals, and at bedtime.  also check if you have symptoms of your blood sugar being too high or too low.  please keep a record of the readings and bring it to your next appointment here (or you can bring the meter itself).  You can write it on any piece of paper.  please call us sooner if your blood sugar goes below 70, or if you have a lot of readings over 200.  Please come back for a follow-up appointment in 2 months.

## 2019-08-25 NOTE — Progress Notes (Signed)
Subjective:    Patient ID: Albert Reed, male    DOB: Apr 18, 1951, 69 y.o.   MRN: [id number]  HPI Pt returns for f/u of diabetes mellitus:   DM type: Insulin-requiring type 2.  Dx'ed: 1997.  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  DKA: never.  Severe hypoglycemia: never.  Pancreatitis: never.  Other: he takes human insulin, due to cost. He takes multiple daily injections; the pattern of cbg's suggests he does not need basal insulin.    Interval history: He brings his meter with his cbg's which I have reviewed today.  cbg varies from 78-333.  It is in general higher as the day goes on.  Pt says he seldom mises the insulin.  He does not take last dose of reg insulin until HS.   Past Medical History:  Diagnosis Date  . COLONIC POLYPS, HX OF 04/01/2007  . DIVERTICULOSIS, COLON 04/01/2007  . DM 11/24/2008  . HYPERCHOLESTEROLEMIA 07/28/2008  . HYPERTENSION 04/01/2007  . HYPERTHYROIDISM 11/09/2009  . LEUKOPENIA, CHRONIC 07/28/2008    Past Surgical History:  Procedure Laterality Date  . BUNIONECTOMY      Social History   Socioeconomic History  . Marital status: Married    Spouse name: Not on file  . Number of children: Not on file  . Years of education: Not on file  . Highest education level: Not on file  Occupational History  . 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 Smoking status: Never Smoker  . Smokeless tobacco: Never Used  Substance and Sexual Activity  . Alcohol use: No    Alcohol/week: 0.0 standard drinks  . Drug use: No  . Sexual activity: Not on file  Other Topics Concern  . Not on file  Social History Narrative   Originally from [place] (in [place] since 1996)   Social Determinants of Health   Financial Resource Strain:   . Difficulty of Paying Living Expenses: Not on file  Food Insecurity:   . Worried About Charity fundraiser in the Last Year: Not on file  . Ran Out of Food  in the Last Year: Not on file  Transportation Needs:   . Lack of Transportation (Medical): Not on file  . Lack of Transportation (Non-Medical): Not on file  Physical Activity:   . Days of Exercise per Week: Not on file  . Minutes of Exercise per Session: Not on file  Stress:   . Feeling of Stress : Not on file  Social Connections:   . Frequency of Communication with Friends and Family: Not on file  . Frequency of Social Gatherings with Friends and Family: Not on file  . Attends Religious Services: Not on file  . Active Member of Clubs or Organizations: Not on file  . Attends Archivist Meetings: Not on file  . Marital Status: Not on file  Intimate Partner Violence:   . Fear of Current or Ex-Partner: Not on file  . Emotionally Abused: Not on file  . Physically Abused: Not on file  . Sexually Abused: Not on file    Current Outpatient Medications on File Prior to Visit  Medication Sig Dispense Refill  . aspirin 81 MG tablet Take 81 mg by mouth daily.      . Blood Glucose Monitoring Suppl ([place]) w/Device KIT by Does not apply route. Use OneTouch Verio Flex as directed to check blood  sugars daily    . glucose blood test strip Use One Touch Verio test strip as instructed 100 each 12  . Insulin Syringe-Needle U-100 (BD INSULIN SYRINGE ULTRAFINE) 31G X 5/16" 0.5 ML MISC 1 each by Does not apply route daily. 100 each 1  . OneTouch Delica Lancets 20E MISC Use with OneTouch Verio as directed/instructed 100 each 11  . Multiple Vitamins-Minerals (CENTRUM SILVER ULTRA MENS) TABS Take 1 tablet by mouth daily.       No current facility-administered medications on file prior to visit.    No Known Allergies  Family History  Problem Relation Age of Onset  . Diabetes Brother        Type 2  . Colon cancer Neg Hx        patient dose not know much about family    BP (!) 146/60 (BP Location: Right Arm, Patient Position: Sitting, Cuff Size: Large)   Pulse (!) 110    Ht 5' 4.5" (1.638 m)   Wt 194 lb 3.2 oz (88.1 kg)   SpO2 93%   BMI 32.82 kg/m    Review of Systems He denies hypoglycemia    Objective:   Physical Exam VITAL SIGNS:  See vs page GENERAL: no distress Pulses: dorsalis pedis intact bilat.   MSK: no deformity of the feet CV: 1+ bilat leg edema Skin:  no ulcer on the feet.  normal color and temp on the feet. Neuro: sensation is intact to touch on the feet.    Lab Results  Component Value Date   HGBA1C 8.8 (A) 08/25/2019       Assessment & Plan:  Insulin-requiring type 2 DM, with DR: worse Renal insuff: in this setting, he does not need basal insulin.   Patient Instructions  Please increase the insulin to 3 times a day (just before each meal) 55-15-55 units.  It is important to take this before meals.   check your blood sugar twice a day.  vary the time of day when you check, between before the 3 meals, and at bedtime.  also check if you have symptoms of your blood sugar being too high or too low.  please keep a record of the readings and bring it to your next appointment here (or you can bring the meter itself).  You can write it on any piece of paper.  please call us sooner if your blood sugar goes below 70, or if you have a lot of readings over 200.  Please come back for a follow-up appointment in 2 months.

## 2019-09-01 DIAGNOSIS — E669 Obesity, unspecified: Secondary | ICD-10-CM | POA: Diagnosis not present

## 2019-09-01 DIAGNOSIS — E1165 Type 2 diabetes mellitus with hyperglycemia: Secondary | ICD-10-CM | POA: Diagnosis not present

## 2019-09-01 DIAGNOSIS — Z1211 Encounter for screening for malignant neoplasm of colon: Secondary | ICD-10-CM | POA: Diagnosis not present

## 2019-09-01 DIAGNOSIS — H1033 Unspecified acute conjunctivitis, bilateral: Secondary | ICD-10-CM | POA: Diagnosis not present

## 2019-09-01 DIAGNOSIS — I10 Essential (primary) hypertension: Secondary | ICD-10-CM | POA: Diagnosis not present

## 2019-09-29 DIAGNOSIS — E1165 Type 2 diabetes mellitus with hyperglycemia: Secondary | ICD-10-CM | POA: Diagnosis not present

## 2019-09-29 DIAGNOSIS — I10 Essential (primary) hypertension: Secondary | ICD-10-CM | POA: Diagnosis not present

## 2019-09-29 DIAGNOSIS — H1033 Unspecified acute conjunctivitis, bilateral: Secondary | ICD-10-CM | POA: Diagnosis not present

## 2019-09-29 DIAGNOSIS — E669 Obesity, unspecified: Secondary | ICD-10-CM | POA: Diagnosis not present

## 2019-10-19 ENCOUNTER — Encounter: Payer: Self-pay | Admitting: Gastroenterology

## 2019-10-25 ENCOUNTER — Other Ambulatory Visit: Payer: Self-pay

## 2019-10-27 ENCOUNTER — Other Ambulatory Visit: Payer: Self-pay

## 2019-10-27 ENCOUNTER — Ambulatory Visit (INDEPENDENT_AMBULATORY_CARE_PROVIDER_SITE_OTHER): Payer: PPO | Admitting: Endocrinology

## 2019-10-27 ENCOUNTER — Encounter: Payer: Self-pay | Admitting: Endocrinology

## 2019-10-27 VITALS — BP 124/70 | HR 92 | Ht 64.5 in | Wt 196.6 lb

## 2019-10-27 DIAGNOSIS — Z794 Long term (current) use of insulin: Secondary | ICD-10-CM | POA: Diagnosis not present

## 2019-10-27 DIAGNOSIS — E119 Type 2 diabetes mellitus without complications: Secondary | ICD-10-CM | POA: Diagnosis not present

## 2019-10-27 LAB — POCT GLYCOSYLATED HEMOGLOBIN (HGB A1C): Hemoglobin A1C: 6.9 % — AB (ref 4.0–5.6)

## 2019-10-27 MED ORDER — INSULIN REGULAR HUMAN 100 UNIT/ML IJ SOLN: INTRAMUSCULAR | 11 refills | Status: DC

## 2019-10-27 MED ORDER — TRULICITY 1.5 MG/0.5ML ~~LOC~~ SOAJ: 1.5000 mg | SUBCUTANEOUS | 11 refills | Status: DC

## 2019-10-27 NOTE — Progress Notes (Signed)
Subjective:    Patient ID: Albert Reed, male    DOB: 10/28/1950, 69 y.o.   MRN: [id number]  HPI Pt returns for f/u of diabetes mellitus:   DM type: Insulin-requiring type 2.  Dx'ed: 1997.  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  DKA: never.  Severe hypoglycemia: never.  Pancreatitis: never.  SDOH: he takes human insulin, due to cost.  Other: He takes multiple daily injections; the pattern of cbg's suggests he does not need basal insulin.   Interval history: He has added Trulicity, 2.48 mg per week.  He says he can afford this.  He brings his meter with his cbg's which I have reviewed today.  cbg varies from 58-318.  It is in general higher as the day goes on.  Pt says he seldom mises the insulin.   Past Medical History:  Diagnosis Date  . COLONIC POLYPS, HX OF 04/01/2007  . DIVERTICULOSIS, COLON 04/01/2007  . DM 11/24/2008  . HYPERCHOLESTEROLEMIA 07/28/2008  . HYPERTENSION 04/01/2007  . HYPERTHYROIDISM 11/09/2009  . LEUKOPENIA, CHRONIC 07/28/2008    Past Surgical History:  Procedure Laterality Date  . BUNIONECTOMY      Social History   Socioeconomic History  . Marital status: Married    Spouse name: Not on file  . Number of children: Not on file  . Years of education: Not on file  . Highest education level: Not on file  Occupational History  . 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 Smoking status: Never Smoker  . Smokeless tobacco: Never Used  Substance and Sexual Activity  . Alcohol use: No    Alcohol/week: 0.0 standard drinks  . Drug use: No  . Sexual activity: Not on file  Other Topics Concern  . Not on file  Social History Narrative   Originally from [place] (in [place] since 1996)   Social Determinants of Health   Financial Resource Strain:   . Difficulty of Paying Living Expenses:   Food Insecurity:   . Worried About Charity fundraiser in the Last Year:   . Academic librarian in the Last Year:   Transportation Needs:   . Film/video editor (Medical):   Marland Kitchen Lack of Transportation (Non-Medical):   Physical Activity:   . Days of Exercise per Week:   . Minutes of Exercise per Session:   Stress:   . Feeling of Stress :   Social Connections:   . Frequency of Communication with Friends and Family:   . Frequency of Social Gatherings with Friends and Family:   . Attends Religious Services:   . Active Member of Clubs or Organizations:   . Attends Archivist Meetings:   Marland Kitchen Marital Status:   Intimate Partner Violence:   . Fear of Current or Ex-Partner:   . Emotionally Abused:   Marland Kitchen Physically Abused:   . Sexually Abused:     Current Outpatient Medications on File Prior to Visit  Medication Sig Dispense Refill  . aspirin 81 MG tablet Take 81 mg by mouth daily.      . Blood Glucose Monitoring Suppl ([place]) w/Device KIT by Does not apply route. Use OneTouch Verio Flex as directed to check blood sugars daily    . glucose blood test strip Use One Touch Verio test strip as instructed 100 each 12  . Insulin Syringe-Needle U-100 (BD INSULIN SYRINGE ULTRAFINE) 31G  X 5/16" 0.5 ML MISC 1 each by Does not apply route daily. 100 each 1  . Multiple Vitamins-Minerals (CENTRUM SILVER ULTRA MENS) TABS Take 1 tablet by mouth daily.      Glory Rose[place]ush Delica Lancets 79G MISC Use with OneTouch Verio as directed/instructed 100 each 11   No current facility-administered medications on file prior to visit.    No Known Allergies  Family History  Problem Relation Age of Onset  . Diabetes Brother        Type 2  . Colon cancer Neg Hx        patient dose not know much about family    BP 124/70 (BP Location: Left Arm, Patient Position: Sitting, Cuff Size: Large)   Pulse 92   Ht 5' 4.5" (1.638 m)   Wt 196 lb 9.6 oz (89.2 kg)   SpO2 99%   BMI 33.23 kg/m    Review of Systems Denies LOC.      Objective:   Physical Exam VITAL SIGNS:  See vs  page GENERAL: no distress Pulses: dorsalis pedis intact bilat.   MSK: no deformity of the feet CV: no leg edema Skin:  no ulcer on the feet.  normal color and temp on the feet.  Neuro: sensation is intact to touch on the feet.     Lab Results  Component Value Date   HGBA1C 6.9 (A) 10/27/2019       Assessment & Plan:  Insulin-requiring type 2 DM. SDOH: I told pt he may run into problems with the "donut hole."  Hypoglycemia: Hypoglycemia: this limits aggressiveness of glycemic control.   Patient Instructions  Please increase the Trulicity to 1.5 mg weekly, and Reduce the insulin to 3 times a day (just before each meal) 40-10-40 units.     check your blood sugar twice a day.  vary the time of day when you check, between before the 3 meals, and at bedtime.  also check if you have symptoms of your blood sugar being too high or too low.  please keep a record of the readings and bring it to your next appointment here (or you can bring the meter itself).  You can write it on any piece of paper.  please call us sooner if your blood sugar goes below 70, or if you have a lot of readings over 200.  Please come back for a follow-up appointment in 2 months.

## 2019-10-27 NOTE — Patient Instructions (Signed)
Please increase the Trulicity to 1.5 mg weekly, and Reduce the insulin to 3 times a day (just before each meal) 40-10-40 units.     check your blood sugar twice a day.  vary the time of day when you check, between before the 3 meals, and at bedtime.  also check if you have symptoms of your blood sugar being too high or too low.  please keep a record of the readings and bring it to your next appointment here (or you can bring the meter itself).  You can write it on any piece of paper.  please call us sooner if your blood sugar goes below 70, or if you have a lot of readings over 200.  Please come back for a follow-up appointment in 2 months.

## 2019-11-17 ENCOUNTER — Other Ambulatory Visit: Payer: Self-pay

## 2019-11-17 ENCOUNTER — Ambulatory Visit: Payer: PPO | Admitting: *Deleted

## 2019-11-17 VITALS — Temp 97.4°F | Ht 64.5 in | Wt 191.0 lb

## 2019-11-17 DIAGNOSIS — Z86010 Personal history of colonic polyps: Secondary | ICD-10-CM

## 2019-11-17 MED ORDER — NA SULFATE-K SULFATE-MG SULF 17.5-3.13-1.6 GM/177ML PO SOLN: 1.0000 | Freq: Once | ORAL | 0 refills | Status: AC

## 2019-11-17 NOTE — Progress Notes (Signed)

## 2019-11-25 ENCOUNTER — Encounter: Payer: Self-pay | Admitting: Gastroenterology

## 2019-12-01 ENCOUNTER — Other Ambulatory Visit: Payer: Self-pay

## 2019-12-01 ENCOUNTER — Ambulatory Visit (AMBULATORY_SURGERY_CENTER): Payer: PPO | Admitting: Gastroenterology

## 2019-12-01 ENCOUNTER — Encounter: Payer: Self-pay | Admitting: Gastroenterology

## 2019-12-01 VITALS — BP 112/61 | HR 85 | Temp 95.9°F | Resp 19 | Ht 64.5 in | Wt 191.0 lb

## 2019-12-01 DIAGNOSIS — D123 Benign neoplasm of transverse colon: Secondary | ICD-10-CM | POA: Diagnosis not present

## 2019-12-01 DIAGNOSIS — E119 Type 2 diabetes mellitus without complications: Secondary | ICD-10-CM | POA: Diagnosis not present

## 2019-12-01 DIAGNOSIS — Z86010 Personal history of colonic polyps: Secondary | ICD-10-CM | POA: Diagnosis not present

## 2019-12-01 DIAGNOSIS — D124 Benign neoplasm of descending colon: Secondary | ICD-10-CM | POA: Diagnosis not present

## 2019-12-01 MED ORDER — SODIUM CHLORIDE 0.9 % IV SOLN: 500.0000 mL | Freq: Once | INTRAVENOUS | Status: DC

## 2019-12-01 NOTE — Op Note (Signed)
[facility] Patient Name: Albert Reed Procedure Date: 12/01/2019 8:34 AM MRN: [id number] Endoscopist: Milus Banister , MD Age: 69 Referring MD:  Date of Birth: 03-Oct-1950 Gender: Male Account #: [id number] Procedure:                Colonoscopy Indications:              High risk colon cancer surveillance: Personal                            history of colonic polyps; Colonoscopy 2014 2 subCM                            adenomas removed. Medicines:                Monitored Anesthesia Care Procedure:                Pre-Anesthesia Assessment:                           - Prior to the procedure, a History and Physical                            was performed, and patient medications and                            allergies were reviewed. The patient's tolerance of                            previous anesthesia was also reviewed. The risks                            and benefits of the procedure and the sedation                            options and risks were discussed with the patient.                            All questions were answered, and informed consent                            was obtained. Prior Anticoagulants: The patient has                            taken no previous anticoagulant or antiplatelet                            agents. ASA Grade Assessment: II - A patient with                            mild systemic disease. After reviewing the risks                            and benefits, the patient was deemed in  satisfactory condition to undergo the procedure.                           After obtaining informed consent, the colonoscope                            was passed under direct vision. Throughout the                            procedure, the patient's blood pressure, pulse, and                            oxygen saturations were monitored continuously. The                            Colonoscope was introduced through the anus  and                            advanced to the the cecum, identified by                            appendiceal orifice and ileocecal valve. The                            colonoscopy was performed without difficulty. The                            patient tolerated the procedure well. The quality                            of the bowel preparation was good. The ileocecal                            valve, appendiceal orifice, and rectum were                            photographed. Scope In: 8:36:30 AM Scope Out: 8:53:09 AM Scope Withdrawal Time: 0 hours 11 minutes 29 seconds  Total Procedure Duration: 0 hours 16 minutes 39 seconds  Findings:                 Three sessile polyps were found in the descending                            colon and transverse colon. The polyps were 1 to 3                            mm in size. These polyps were removed with a cold                            snare. Resection and retrieval were complete.                           The exam was otherwise without abnormality on  direct and retroflexion views. Complications:            No immediate complications. Estimated blood loss:                            None. Estimated Blood Loss:     Estimated blood loss: none. Impression:               - Three 1 to 3 mm polyps in the descending colon                            and in the transverse colon, removed with a cold                            snare. Resected and retrieved.                           - The examination was otherwise normal on direct                            and retroflexion views. Recommendation:           - Patient has a contact number available for                            emergencies. The signs and symptoms of potential                            delayed complications were discussed with the                            patient. Return to normal activities tomorrow.                            Written discharge  instructions were provided to the                            patient.                           - Resume previous diet.                           - Continue present medications.                           - Await pathology results. Milus Banister, MD 12/01/2019 8:55:26 AM This report has been signed electronically.

## 2019-12-01 NOTE — Progress Notes (Signed)
Called to room to assist during endoscopic procedure.  Patient ID and intended procedure confirmed with present staff. Received instructions for my participation in the procedure from the performing physician.  

## 2019-12-01 NOTE — Progress Notes (Signed)
Report to PACU, RN, vss, BBS= Clear.  

## 2019-12-01 NOTE — Patient Instructions (Signed)
Please see handouts given to you on Polyps. Thank you for letting us take care of your healthcare needs today.   YOU HAD AN ENDOSCOPIC PROCEDURE TODAY AT [facility]:   Refer to the procedure report that was given to you for any specific questions about what was found during the examination.  If the procedure report does not answer your questions, please call your gastroenterologist to clarify.  If you requested that your care partner not be given the details of your procedure findings, then the procedure report has been included in a sealed envelope for you to review at your convenience later.  YOU SHOULD EXPECT: Some feelings of bloating in the abdomen. Passage of more gas than usual.  Walking can help get rid of the air that was put into your GI tract during the procedure and reduce the bloating. If you had a lower endoscopy (such as a colonoscopy or flexible sigmoidoscopy) you may notice spotting of blood in your stool or on the toilet paper. If you underwent a bowel prep for your procedure, you may not have a normal bowel movement for a few days.  Please Note:  You might notice some irritation and congestion in your nose or some drainage.  This is from the oxygen used during your procedure.  There is no need for concern and it should clear up in a day or so.  SYMPTOMS TO REPORT IMMEDIATELY:   Following lower endoscopy (colonoscopy or flexible sigmoidoscopy):  Excessive amounts of blood in the stool  Significant tenderness or worsening of abdominal pains  Swelling of the abdomen that is new, acute  Fever of 100F or higher  For urgent or emergent issues, a gastroenterologist can be reached at any hour by calling [phone]. Do not use MyChart messaging for urgent concerns.    DIET:  We do recommend a small meal at first, but then you may proceed to your regular diet.  Drink plenty of fluids but you should avoid alcoholic beverages for 24 hours.  ACTIVITY:  You should  plan to take it easy for the rest of today and you should NOT DRIVE or use heavy machinery until tomorrow (because of the sedation medicines used during the test).    FOLLOW UP: Our staff will call the number listed on your records 48-72 hours following your procedure to check on you and address any questions or concerns that you may have regarding the information given to you following your procedure. If we do not reach you, we will leave a message.  We will attempt to reach you two times.  During this call, we will ask if you have developed any symptoms of COVID 19. If you develop any symptoms (ie: fever, flu-like symptoms, shortness of breath, cough etc.) before then, please call [phone].  If you test positive for Covid 19 in the 2 weeks post procedure, please call and report this information to [place].    If any biopsies were taken you will be contacted by phone or by letter within the next 1-3 weeks.  Please call us at [phone] if you have not heard about the biopsies in 3 weeks.    SIGNATURES/CONFIDENTIALITY: You and/or your care partner have signed paperwork which will be entered into your electronic medical record.  These signatures attest to the fact that that the information above on your After Visit Summary has been reviewed and is understood.  Full responsibility of the confidentiality of this discharge information lies with you  and/or your care-partner. 

## 2019-12-01 NOTE — Progress Notes (Signed)
Tamp by LC and vitals by CW

## 2019-12-03 ENCOUNTER — Telehealth: Payer: Self-pay

## 2019-12-03 NOTE — Telephone Encounter (Signed)
  Follow up Call-  Call back number 12/01/2019  Post procedure Call Back phone  # [phone]  Permission to leave phone message Yes  Some recent data might be hidden     Patient questions:  Do you have a fever, pain , or abdominal swelling? No. Pain Score  0 *  Have you tolerated food without any problems? Yes.    Have you been able to return to your normal activities? Yes.    Do you have any questions about your discharge instructions: Diet   No. Medications  No. Follow up visit  No.  Do you have questions or concerns about your Care? No.  Actions: * If pain score is 4 or above: No action needed, pain <4. 1. Have you developed a fever since your procedure? no  2.   Have you had an respiratory symptoms (SOB or cough) since your procedure? no  3.   Have you tested positive for COVID 19 since your procedure no  4.   Have you had any family members/close contacts diagnosed with the COVID 19 since your procedure?  no   If yes to any of these questions please route to Joylene John, RN and Erenest Rasher, RN

## 2019-12-06 ENCOUNTER — Encounter: Payer: Self-pay | Admitting: Gastroenterology

## 2019-12-22 DIAGNOSIS — I10 Essential (primary) hypertension: Secondary | ICD-10-CM | POA: Diagnosis not present

## 2019-12-22 DIAGNOSIS — E1165 Type 2 diabetes mellitus with hyperglycemia: Secondary | ICD-10-CM | POA: Diagnosis not present

## 2019-12-22 DIAGNOSIS — E669 Obesity, unspecified: Secondary | ICD-10-CM | POA: Diagnosis not present

## 2020-01-12 ENCOUNTER — Ambulatory Visit (INDEPENDENT_AMBULATORY_CARE_PROVIDER_SITE_OTHER): Payer: PPO | Admitting: Endocrinology

## 2020-01-12 ENCOUNTER — Encounter: Payer: Self-pay | Admitting: Endocrinology

## 2020-01-12 ENCOUNTER — Other Ambulatory Visit: Payer: Self-pay

## 2020-01-12 VITALS — BP 164/70 | HR 93 | Ht 64.5 in | Wt 191.0 lb

## 2020-01-12 DIAGNOSIS — Z794 Long term (current) use of insulin: Secondary | ICD-10-CM | POA: Diagnosis not present

## 2020-01-12 DIAGNOSIS — E119 Type 2 diabetes mellitus without complications: Secondary | ICD-10-CM | POA: Diagnosis not present

## 2020-01-12 LAB — POCT GLYCOSYLATED HEMOGLOBIN (HGB A1C): Hemoglobin A1C: 7.4 % — AB (ref 4.0–5.6)

## 2020-01-12 MED ORDER — TRULICITY 3 MG/0.5ML ~~LOC~~ SOAJ: 3.0000 mg | SUBCUTANEOUS | 3 refills | Status: DC

## 2020-01-12 MED ORDER — INSULIN REGULAR HUMAN 100 UNIT/ML IJ SOLN: 30.0000 [IU] | Freq: Two times a day (BID) | INTRAMUSCULAR | 3 refills | Status: DC

## 2020-01-12 NOTE — Patient Instructions (Addendum)
Please increase the Trulicity to 3 mg weekly, and:  Reduce the insulin to 30 units twice a day (just before breakfast and supper).   check your blood sugar twice a day.  vary the time of day when you check, between before the 3 meals, and at bedtime.  also check if you have symptoms of your blood sugar being too high or too low.  please keep a record of the readings and bring it to your next appointment here (or you can bring the meter itself).  You can write it on any piece of paper.  please call us sooner if your blood sugar goes below 70, or if you have a lot of readings over 200.  Please come back for a follow-up appointment in 3 months.

## 2020-01-12 NOTE — Progress Notes (Signed)
Subjective:    Patient ID: Albert Reed, male    DOB: 1951-06-18, 69 y.o.   MRN: [id number]  HPI Pt returns for f/u of diabetes mellitus:   DM type: Insulin-requiring type 2.  Dx'ed: 1997.  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  DKA: never.  Severe hypoglycemia: never.  Pancreatitis: never.  SDOH: he takes human insulin, due to cost.  Other: He takes multiple daily injections; the pattern of cbg's suggests he does not need basal insulin.   Interval history: He brings his meter with his cbg's which I have reviewed today.cbg's vary from 65-152.  It is in general higher as the day goes on.   Past Medical History:  Diagnosis Date  . COLONIC POLYPS, HX OF 04/01/2007  . DIVERTICULOSIS, COLON 04/01/2007  . DM 11/24/2008  . HYPERCHOLESTEROLEMIA 07/28/2008  . HYPERTENSION 04/01/2007  . HYPERTHYROIDISM 11/09/2009  . LEUKOPENIA, CHRONIC 07/28/2008    Past Surgical History:  Procedure Laterality Date  . BUNIONECTOMY      Social History   Socioeconomic History  . Marital status: Married    Spouse name: Not on file  . Number of children: Not on file  . Years of education: Not on file  . Highest education level: Not on file  Occupational History  . 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 Smoking status: Never Smoker  . Smokeless tobacco: Never Used  Substance and Sexual Activity  . Alcohol use: No    Alcohol/week: 0.0 standard drinks  . Drug use: No  . Sexual activity: Not on file  Other Topics Concern  . Not on file  Social History Narrative   Originally from [place] (in [place] since 1996)   Social Determinants of Health   Financial Resource Strain:   . Difficulty of Paying Living Expenses:   Food Insecurity:   . Worried About Charity fundraiser in the Last Year:   . Arboriculturist in the Last Year:   Transportation Needs:   . Film/video editor (Medical):   Marland Kitchen Lack of Transportation  (Non-Medical):   Physical Activity:   . Days of Exercise per Week:   . Minutes of Exercise per Session:   Stress:   . Feeling of Stress :   Social Connections:   . Frequency of Communication with Friends and Family:   . Frequency of Social Gatherings with Friends and Family:   . Attends Religious Services:   . Active Member of Clubs or Organizations:   . Attends Archivist Meetings:   Marland Kitchen Marital Status:   Intimate Partner Violence:   . Fear of Current or Ex-Partner:   . Emotionally Abused:   Marland Kitchen Physically Abused:   . Sexually Abused:     Current Outpatient Medications on File Prior to Visit  Medication Sig Dispense Refill  . aspirin 81 MG tablet Take 81 mg by mouth daily.      . Blood Glucose Monitoring Suppl ([place]) w/Device KIT by Does not apply route. Use OneTouch Verio Flex as directed to check blood sugars daily    . glucose blood test strip Use One Touch Verio test strip as instructed 100 each 12  . Insulin Syringe-Needle U-100 (BD INSULIN SYRINGE ULTRAFINE) 31G X 5/16" 0.5 ML MISC 1 each by Does not apply route daily. 100 each 1  . Multiple Vitamins-Minerals (CENTRUM SILVER ULTRA MENS) TABS Take  1 tablet by mouth daily.      Glory Rosebush Delica Lancets 76H MISC Use with OneTouch Verio as directed/instructed 100 each 11   No current facility-administered medications on file prior to visit.    No Known Allergies  Family History  Problem Relation Age of Onset  . Diabetes Brother        Type 2  . Colon cancer Neg Hx        patient dose not know much about family  . Esophageal cancer Neg Hx   . Stomach cancer Neg Hx   . Rectal cancer Neg Hx     BP (!) 164/70   Pulse 93   Ht 5' 4.5" (1.638 m)   Wt 191 lb (86.6 kg)   SpO2 99%   BMI 32.28 kg/m   Review of Systems He denies LOC and nausea.      Objective:   Physical Exam VITAL SIGNS:  See vs page GENERAL: no distress Pulses: dorsalis pedis intact bilat.   MSK: no deformity of the  feet CV: no leg edema Skin:  no ulcer on the feet.  normal color and temp on the feet.  Neuro: sensation is intact to touch on the feet.     Lab Results  Component Value Date   HGBA1C 7.4 (A) 01/12/2020       Assessment & Plan:  Insulin-requiring type 2 DM Hypoglycemia, due to insulin.   Patient Instructions  Please increase the Trulicity to 3 mg weekly, and:  Reduce the insulin to 30 units twice a day (just before breakfast and supper).   check your blood sugar twice a day.  vary the time of day when you check, between before the 3 meals, and at bedtime.  also check if you have symptoms of your blood sugar being too high or too low.  please keep a record of the readings and bring it to your next appointment here (or you can bring the meter itself).  You can write it on any piece of paper.  please call us sooner if your blood sugar goes below 70, or if you have a lot of readings over 200.  Please come back for a follow-up appointment in 3 months.

## 2020-01-20 ENCOUNTER — Other Ambulatory Visit: Payer: Self-pay | Admitting: Endocrinology

## 2020-01-26 DIAGNOSIS — E113592 Type 2 diabetes mellitus with proliferative diabetic retinopathy without macular edema, left eye: Secondary | ICD-10-CM | POA: Diagnosis not present

## 2020-01-26 DIAGNOSIS — E113311 Type 2 diabetes mellitus with moderate nonproliferative diabetic retinopathy with macular edema, right eye: Secondary | ICD-10-CM | POA: Diagnosis not present

## 2020-01-26 DIAGNOSIS — H35371 Puckering of macula, right eye: Secondary | ICD-10-CM | POA: Diagnosis not present

## 2020-01-26 DIAGNOSIS — H43811 Vitreous degeneration, right eye: Secondary | ICD-10-CM | POA: Diagnosis not present

## 2020-02-03 ENCOUNTER — Telehealth: Payer: Self-pay | Admitting: Endocrinology

## 2020-02-03 NOTE — Telephone Encounter (Signed)
Rhonda from [facility] called to advise that after she reached out to the patient as part of their patient interaction process.  Patient said that he had an appointment 04/12/20 - [facility] felt it might be a good idea to discuss preventative Statin with patient at visit.

## 2020-02-03 NOTE — Telephone Encounter (Signed)
FYI

## 2020-02-16 DIAGNOSIS — I10 Essential (primary) hypertension: Secondary | ICD-10-CM | POA: Diagnosis not present

## 2020-02-16 DIAGNOSIS — Z418 Encounter for other procedures for purposes other than remedying health state: Secondary | ICD-10-CM | POA: Diagnosis not present

## 2020-02-16 DIAGNOSIS — E119 Type 2 diabetes mellitus without complications: Secondary | ICD-10-CM | POA: Diagnosis not present

## 2020-02-27 DIAGNOSIS — Z20822 Contact with and (suspected) exposure to covid-19: Secondary | ICD-10-CM | POA: Diagnosis not present

## 2020-04-03 ENCOUNTER — Ambulatory Visit (HOSPITAL_COMMUNITY)
Admission: EM | Admit: 2020-04-03 | Discharge: 2020-04-03 | Disposition: A | Discharge status: Home / Self Care | Payer: PPO | Attending: Family Medicine | Admitting: Family Medicine

## 2020-04-03 ENCOUNTER — Other Ambulatory Visit: Payer: Self-pay

## 2020-04-03 DIAGNOSIS — Z1152 Encounter for screening for COVID-19: Secondary | ICD-10-CM | POA: Diagnosis not present

## 2020-04-03 DIAGNOSIS — Z20822 Contact with and (suspected) exposure to covid-19: Secondary | ICD-10-CM | POA: Insufficient documentation

## 2020-04-03 LAB — SARS CORONAVIRUS 2 (TAT 6-24 HRS): SARS Coronavirus 2: NEGATIVE

## 2020-04-03 NOTE — ED Triage Notes (Signed)
Pt presents for Covid test. Pt states he has to have covid test before returning to work after being out of the country. Denies any symptoms related to covid.

## 2020-04-11 ENCOUNTER — Other Ambulatory Visit: Payer: Self-pay | Admitting: Endocrinology

## 2020-04-12 ENCOUNTER — Encounter: Payer: Self-pay | Admitting: Endocrinology

## 2020-04-12 ENCOUNTER — Other Ambulatory Visit: Payer: Self-pay

## 2020-04-12 ENCOUNTER — Ambulatory Visit (INDEPENDENT_AMBULATORY_CARE_PROVIDER_SITE_OTHER): Payer: PPO | Admitting: Endocrinology

## 2020-04-12 VITALS — BP 136/78 | HR 88 | Ht 64.5 in | Wt 188.8 lb

## 2020-04-12 DIAGNOSIS — Z794 Long term (current) use of insulin: Secondary | ICD-10-CM

## 2020-04-12 DIAGNOSIS — E119 Type 2 diabetes mellitus without complications: Secondary | ICD-10-CM

## 2020-04-12 LAB — POCT GLYCOSYLATED HEMOGLOBIN (HGB A1C): Hemoglobin A1C: 7.2 % — AB (ref 4.0–5.6)

## 2020-04-12 MED ORDER — TRULICITY 4.5 MG/0.5ML ~~LOC~~ SOAJ: 4.5000 mg | SUBCUTANEOUS | 3 refills | Status: DC

## 2020-04-12 NOTE — Progress Notes (Signed)
Subjective:    Patient ID: Albert Reed, male    DOB: Jul 18, 1951, 69 y.o.   MRN: [id number]  HPI Pt returns for f/u of diabetes mellitus:   DM type: Insulin-requiring type 2.  Dx'ed: 1997.  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   DKA: never.  Severe hypoglycemia: never.  Pancreatitis: never.  SDOH: he takes human insulin, due to cost.  Other: He takes multiple daily injections; the pattern of cbg's suggests he does not need basal insulin.   Interval history: He brings his meter with his cbg's which I have reviewed today.cbg's vary from 70-200.  It is in general higher as the day goes on.   Past Medical History:  Diagnosis Date   COLONIC POLYPS, HX OF 04/01/2007   DIVERTICULOSIS, COLON 04/01/2007   DM 11/24/2008   HYPERCHOLESTEROLEMIA 07/28/2008   HYPERTENSION 04/01/2007   HYPERTHYROIDISM 11/09/2009   LEUKOPENIA, CHRONIC 07/28/2008    Past Surgical History:  Procedure Laterality Date   BUNIONECTOMY      Social History   Socioeconomic History   Marital status: Married    Spouse name: Not on file   Number of children: Not on file   Years of education: Not on file   Highest education level: Not on file  Occupational History   Occupation: WORKS SECOND SHIFT    Employer: [employer]    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  Tobacco Use   Smoking status: Never Smoker   Smokeless tobacco: Never Used  Substance and Sexual Activity   Alcohol use: No    Alcohol/week: 0.0 standard drinks   Drug use: No   Sexual activity: Not on file  Other Topics Concern   Not on file  Social History Narrative   Originally from [place] (in [place] since 1996)   Social Determinants of Health   Financial Resource Strain:    Difficulty of Paying Living Expenses: Not on file  Food Insecurity:    Worried About Charity fundraiser in the Last Year: Not on file   [employer] of Food in the Last Year: Not on file  Transportation Needs:    Lexicographer (Medical): Not on file   Lack of Transportation (Non-Medical): Not on file  Physical Activity:    Days of Exercise per Week: Not on file   Minutes of Exercise per Session: Not on file  Stress:    Feeling of Stress : Not on file  Social Connections:    Frequency of Communication with Friends and Family: Not on file   Frequency of Social Gatherings with Friends and Family: Not on file   Attends Religious Services: Not on file   Active Member of Clubs or Organizations: Not on file   Attends Archivist Meetings: Not on file   Marital Status: Not on file  Intimate Partner Violence:    Fear of Current or Ex-Partner: Not on file   Emotionally Abused: Not on file   Physically Abused: Not on file   Sexually Abused: Not on file    Current Outpatient Medications on File Prior to Visit  Medication Sig Dispense Refill   aspirin 81 MG tablet Take 81 mg by mouth daily.       Blood Glucose Monitoring Suppl (ONETOUCH VERIO FLEX SYSTEM) w/Device KIT 1 each by Does not apply route 2 (two) times daily. E11.9     insulin regular (NOVOLIN R) 100 units/mL injection Inject 0.3 mLs (30 Units total) into the skin  2 (two) times daily before a meal. 60 mL 3   Insulin Syringe-Needle U-100 (BD INSULIN SYRINGE ULTRAFINE) 31G X 5/16" 0.5 ML MISC 1 each by Does not apply route daily. 100 each 1   Lancets (ONETOUCH DELICA PLUS [id number]) MISC 1 each by Other route 2 (two) times daily. E11.9 200 each 0   Multiple Vitamins-Minerals (CENTRUM SILVER ULTRA MENS) TABS Take 1 tablet by mouth daily.       ONETOUCH ULTRA test strip Use One Touch Verio test strip as instructed (Patient taking differently: 1 each by Other route 2 (two) times daily. E11.9) 100 strip 6   No current facility-administered medications on file prior to visit.    No Known Allergies  Family History  Problem Relation Age of Onset   Diabetes Brother        Type 2   Colon cancer Neg Hx        patient  dose not know much about family   Esophageal cancer Neg Hx    Stomach cancer Neg Hx    Rectal cancer Neg Hx     BP 136/78    Pulse 88    Ht 5' 4.5" (1.638 m)    Wt 188 lb 12.8 oz (85.6 kg)    SpO2 96%    BMI 31.91 kg/m    Review of Systems He denies hypoglycemia and nausea.      Objective:   Physical Exam VITAL SIGNS:  See vs page GENERAL: no distress Pulses: dorsalis pedis intact bilat.   MSK: no deformity of the feet CV: no leg edema.   Skin:  no ulcer on the feet.  normal color and temp on the feet.   Neuro: sensation is intact to touch on the feet.    Lab Results  Component Value Date   HGBA1C 7.2 (A) 04/12/2020       Assessment & Plan:  Insulin-requiring type 2 DM: Uncontrolled.    Patient Instructions  Please increase the Trulicity to 4.5 mg weekly, and:  Please continue the same insulin.   check your blood sugar twice a day.  vary the time of day when you check, between before the 3 meals, and at bedtime.  also check if you have symptoms of your blood sugar being too high or too low.  please keep a record of the readings and bring it to your next appointment here (or you can bring the meter itself).  You can write it on any piece of paper.  please call us sooner if your blood sugar goes below 70, or if you have a lot of readings over 200.  Please come back for a follow-up appointment in 3 months.

## 2020-04-12 NOTE — Patient Instructions (Addendum)
Please increase the Trulicity to 4.5 mg weekly, and:  Please continue the same insulin.   check your blood sugar twice a day.  vary the time of day when you check, between before the 3 meals, and at bedtime.  also check if you have symptoms of your blood sugar being too high or too low.  please keep a record of the readings and bring it to your next appointment here (or you can bring the meter itself).  You can write it on any piece of paper.  please call us sooner if your blood sugar goes below 70, or if you have a lot of readings over 200.  Please come back for a follow-up appointment in 3 months.

## 2020-04-19 DIAGNOSIS — E119 Type 2 diabetes mellitus without complications: Secondary | ICD-10-CM | POA: Diagnosis not present

## 2020-04-19 DIAGNOSIS — I10 Essential (primary) hypertension: Secondary | ICD-10-CM | POA: Diagnosis not present

## 2020-04-19 DIAGNOSIS — E559 Vitamin D deficiency, unspecified: Secondary | ICD-10-CM | POA: Diagnosis not present

## 2020-04-19 DIAGNOSIS — Z0000 Encounter for general adult medical examination without abnormal findings: Secondary | ICD-10-CM | POA: Diagnosis not present

## 2020-04-19 DIAGNOSIS — Z125 Encounter for screening for malignant neoplasm of prostate: Secondary | ICD-10-CM | POA: Diagnosis not present

## 2020-04-21 ENCOUNTER — Ambulatory Visit (HOSPITAL_COMMUNITY)
Admission: EM | Admit: 2020-04-21 | Discharge: 2020-04-21 | Disposition: A | Discharge status: Home / Self Care | Payer: PPO | Attending: Family Medicine | Admitting: Family Medicine

## 2020-04-21 ENCOUNTER — Encounter (HOSPITAL_COMMUNITY): Payer: Self-pay | Admitting: *Deleted

## 2020-04-21 ENCOUNTER — Other Ambulatory Visit: Payer: Self-pay

## 2020-04-21 DIAGNOSIS — Z20822 Contact with and (suspected) exposure to covid-19: Secondary | ICD-10-CM | POA: Insufficient documentation

## 2020-04-21 DIAGNOSIS — R112 Nausea with vomiting, unspecified: Secondary | ICD-10-CM

## 2020-04-21 DIAGNOSIS — Z833 Family history of diabetes mellitus: Secondary | ICD-10-CM | POA: Diagnosis not present

## 2020-04-21 DIAGNOSIS — Z79899 Other long term (current) drug therapy: Secondary | ICD-10-CM | POA: Diagnosis not present

## 2020-04-21 DIAGNOSIS — E7800 Pure hypercholesterolemia, unspecified: Secondary | ICD-10-CM | POA: Diagnosis not present

## 2020-04-21 DIAGNOSIS — Z86010 Personal history of colonic polyps: Secondary | ICD-10-CM | POA: Diagnosis not present

## 2020-04-21 DIAGNOSIS — I10 Essential (primary) hypertension: Secondary | ICD-10-CM | POA: Diagnosis not present

## 2020-04-21 DIAGNOSIS — E0590 Thyrotoxicosis, unspecified without thyrotoxic crisis or storm: Secondary | ICD-10-CM | POA: Diagnosis not present

## 2020-04-21 DIAGNOSIS — Z7982 Long term (current) use of aspirin: Secondary | ICD-10-CM | POA: Insufficient documentation

## 2020-04-21 DIAGNOSIS — E119 Type 2 diabetes mellitus without complications: Secondary | ICD-10-CM | POA: Insufficient documentation

## 2020-04-21 DIAGNOSIS — Z794 Long term (current) use of insulin: Secondary | ICD-10-CM | POA: Insufficient documentation

## 2020-04-21 LAB — SARS CORONAVIRUS 2 (TAT 6-24 HRS): SARS Coronavirus 2: NEGATIVE

## 2020-04-21 MED ORDER — ONDANSETRON 4 MG PO TBDP: 4.0000 mg | ORAL_TABLET | Freq: Three times a day (TID) | ORAL | 0 refills | Status: AC | PRN

## 2020-04-21 NOTE — ED Triage Notes (Addendum)
Patient states that he visited a friend yesterday and then came home and had episode of vomiting with sweating and dizziness. States same thing happened this am.   Denies abdominal pain. Denies nausea at this time.   Did not take lantus last night, sugar approx 220 this am.

## 2020-04-21 NOTE — ED Provider Notes (Signed)
[facility]    CSN: [id number] Arrival date & time: 04/21/20  0830      History   Chief Complaint Chief Complaint  Patient presents with  . Emesis    HPI Albert Reed is a 69 y.o. male.   Started last night with nausea, vomiting, lightheadedness, and sweating after visiting a friend who was reported to be sick (patient unsure with what). Sweats and vomiting again this morning but at the current moment feeling a bit better. Not trying anything OTC for sxs. UTD on COVID vaccines. Denies diarrhea, constipation, abdominal pain, urinary sxs, intolerance to PO intake, syncope, CP, SOB, sore throat, congestion, melena.      Past Medical History:  Diagnosis Date  . COLONIC POLYPS, HX OF 04/01/2007  . DIVERTICULOSIS, COLON 04/01/2007  . DM 11/24/2008  . HYPERCHOLESTEROLEMIA 07/28/2008  . HYPERTENSION 04/01/2007  . HYPERTHYROIDISM 11/09/2009  . LEUKOPENIA, CHRONIC 07/28/2008    Patient Active Problem List   Diagnosis Date Noted  . Pelvic pain in male 09/26/2017  . Diabetes ([place]) 12/09/2015  . Pain of finger of right hand 10/27/2013  . Encounter for long-term (current) use of other medications 04/23/2012  . Screening for prostate cancer 04/23/2012  . Routine general medical examination at a health care facility 02/09/2011  . LEG PAIN, RIGHT 08/09/2010  . Thyrotoxicosis 11/09/2009  . HYPERCHOLESTEROLEMIA 07/28/2008  . Leukocytopenia 07/28/2008  . Essential hypertension 04/01/2007  . DIVERTICULOSIS, COLON 04/01/2007  . COLONIC POLYPS, HX OF 04/01/2007    Past Surgical History:  Procedure Laterality Date  . BUNIONECTOMY         Home Medications    Prior to Admission medications   Medication Sig Start Date End Date Taking? Authorizing Provider  aspirin 81 MG tablet Take 81 mg by mouth daily.      [provider]  Blood Glucose Monitoring Suppl ([place]) w/Device KIT 1 each by Does not apply route 2 (two) times daily. E11.9     [provider]  Dulaglutide (TRULICITY) 4.5 TW/6.5KC SOPN Inject 0.5 mLs (4.5 mg total) into the skin once a week. 04/12/20   Renato Shin, MD  insulin regular (NOVOLIN R) 100 units/mL injection Inject 0.3 mLs (30 Units total) into the skin 2 (two) times daily before a meal. 01/12/20   Renato Shin, MD  Insulin Syringe-Needle U-100 (BD INSULIN SYRINGE ULTRAFINE) 31G X 5/16" 0.5 ML MISC 1 each by Does not apply route daily. 02/18/12   Renato Shin, MD  Lancets [facility] DELICA PLUS [id number]) [place] 1 each by Other route 2 (two) times daily. E11.9 04/11/20   Renato Shin, MD  Multiple Vitamins-Minerals (CENTRUM SILVER ULTRA MENS) TABS Take 1 tablet by mouth daily.      [provider]  ondansetron (ZOFRAN ODT) 4 MG disintegrating tablet Take 1 tablet (4 mg total) by mouth every 8 (eight) hours as needed for nausea or vomiting. 04/21/20   Volney American, PA-C  [facility] ULTRA test strip Use One Touch Verio test strip as instructed Patient taking differently: 1 each by Other route 2 (two) times daily. E11.9 01/20/20   Renato Shin, MD    Family History Family History  Problem Relation Age of Onset  . Diabetes Brother        Type 2  . Colon cancer Neg Hx        patient dose not know much about family  . Esophageal cancer Neg Hx   . Stomach cancer Neg Hx   .  Rectal cancer Neg Hx     Social History Social History   Tobacco Use  . Smoking status: Never Smoker  . Smokeless tobacco: Never Used  Substance Use Topics  . Alcohol use: No    Alcohol/week: 0.0 standard drinks  . Drug use: No     Allergies   Patient has no known allergies.   Review of Systems Review of Systems  Constitutional: Positive for chills and diaphoresis.  HENT: Negative.   Eyes: Negative.   Respiratory: Negative.   Cardiovascular: Negative.   Gastrointestinal: Positive for nausea and vomiting.  Genitourinary: Negative.   Musculoskeletal: Negative.   Skin: Negative.   Neurological:  Negative.   Psychiatric/Behavioral: Negative.      Physical Exam Triage Vital Signs ED Triage Vitals [04/21/20 0920]  Enc Vitals Group     BP (!) 162/85     Pulse Rate 78     Resp 17     Temp (!) 97.5 F (36.4 C)     Temp Source Oral     SpO2 100 %     Weight      Height      Head Circumference      Peak Flow      Pain Score 0     Pain Loc      Pain Edu?      Excl. in [place]?    No data found.  Updated Vital Signs BP (!) 162/85 (BP Location: Right Arm)   Pulse 78   Temp (!) 97.5 F (36.4 C) (Oral)   Resp 17   SpO2 100%   Visual Acuity Right Eye Distance:   Left Eye Distance:   Bilateral Distance:    Right Eye Near:   Left Eye Near:    Bilateral Near:     Physical Exam Vitals and nursing note reviewed.  Constitutional:      Appearance: Normal appearance.  HENT:     Head: Atraumatic.  Eyes:     Extraocular Movements: Extraocular movements intact.     Conjunctiva/sclera: Conjunctivae normal.  Cardiovascular:     Rate and Rhythm: Normal rate and regular rhythm.  Pulmonary:     Effort: Pulmonary effort is normal.     Breath sounds: Normal breath sounds.  Abdominal:     General: Bowel sounds are normal. There is no distension.     Palpations: Abdomen is soft.     Tenderness: There is no abdominal tenderness. There is no right CVA tenderness, left CVA tenderness or guarding.  Musculoskeletal:        General: Normal range of motion.     Cervical back: Normal range of motion and neck supple.  Skin:    General: Skin is warm and dry.  Neurological:     General: No focal deficit present.     Mental Status: He is oriented to person, place, and time.  Psychiatric:        Mood and Affect: Mood normal.        Thought Content: Thought content normal.        Judgment: Judgment normal.      UC Treatments / Results  Labs (all labs ordered are listed, but only abnormal results are displayed) Labs Reviewed  SARS CORONAVIRUS 2 (TAT 6-24 HRS)     EKG   Radiology No results found.  Procedures Procedures (including critical care time)  Medications Ordered in UC Medications - No data to display  Initial Impression / Assessment and Plan / UC Course  I have reviewed the triage vital signs and the nursing notes.  Pertinent labs & imaging results that were available during my care of the patient were reviewed by me and considered in my medical decision making (see chart for details).     Will test to r/o COVID 19, zofran given for prn symptomatic relief. Discussed BRAT diet, rest, hydration, strict return precautions if sxs worsening. Work note provided stating he is being tested for COVID and is to isolate until these return.   Final Clinical Impressions(s) / UC Diagnoses   Final diagnoses:  Non-intractable vomiting with nausea, unspecified vomiting type   Discharge Instructions   None    ED Prescriptions    Medication Sig Dispense Auth. Provider   ondansetron (ZOFRAN ODT) 4 MG disintegrating tablet Take 1 tablet (4 mg total) by mouth every 8 (eight) hours as needed for nausea or vomiting. 21 tablet Volney American, [place]     PDMP not reviewed this encounter.   Volney American, [place] 04/21/20 1001

## 2020-05-11 ENCOUNTER — Encounter: Payer: Self-pay | Admitting: Endocrinology

## 2020-05-12 ENCOUNTER — Other Ambulatory Visit: Payer: Self-pay | Admitting: Endocrinology

## 2020-05-12 MED ORDER — TRULICITY 1.5 MG/0.5ML ~~LOC~~ SOAJ: 1.5000 mg | SUBCUTANEOUS | 3 refills | Status: DC

## 2020-05-31 ENCOUNTER — Encounter: Payer: Self-pay | Admitting: Endocrinology

## 2020-06-01 DIAGNOSIS — E113391 Type 2 diabetes mellitus with moderate nonproliferative diabetic retinopathy without macular edema, right eye: Secondary | ICD-10-CM | POA: Diagnosis not present

## 2020-06-01 DIAGNOSIS — H43811 Vitreous degeneration, right eye: Secondary | ICD-10-CM | POA: Diagnosis not present

## 2020-06-01 DIAGNOSIS — E113512 Type 2 diabetes mellitus with proliferative diabetic retinopathy with macular edema, left eye: Secondary | ICD-10-CM | POA: Diagnosis not present

## 2020-06-01 DIAGNOSIS — H401120 Primary open-angle glaucoma, left eye, stage unspecified: Secondary | ICD-10-CM | POA: Diagnosis not present

## 2020-06-05 DIAGNOSIS — H401120 Primary open-angle glaucoma, left eye, stage unspecified: Secondary | ICD-10-CM | POA: Diagnosis not present

## 2020-07-05 ENCOUNTER — Encounter: Payer: Self-pay | Admitting: Endocrinology

## 2020-07-05 ENCOUNTER — Ambulatory Visit (INDEPENDENT_AMBULATORY_CARE_PROVIDER_SITE_OTHER): Payer: PPO | Admitting: Endocrinology

## 2020-07-05 ENCOUNTER — Other Ambulatory Visit: Payer: Self-pay

## 2020-07-05 VITALS — BP 122/58 | HR 96 | Ht 64.5 in | Wt 188.0 lb

## 2020-07-05 DIAGNOSIS — Z794 Long term (current) use of insulin: Secondary | ICD-10-CM | POA: Diagnosis not present

## 2020-07-05 DIAGNOSIS — E119 Type 2 diabetes mellitus without complications: Secondary | ICD-10-CM | POA: Diagnosis not present

## 2020-07-05 LAB — BASIC METABOLIC PANEL
BUN: 17 mg/dL (ref 6–23)
CO2: 32 mEq/L (ref 19–32)
Calcium: 8.9 mg/dL (ref 8.4–10.5)
Chloride: 100 mEq/L (ref 96–112)
Creatinine, Ser: 1.17 mg/dL (ref 0.40–1.50)
GFR: 63.83 mL/min (ref >60.00)
Glucose, Bld: 271 mg/dL — ABNORMAL HIGH (ref 70–99)
Potassium: 4.2 mEq/L (ref 3.5–5.1)
Sodium: 137 mEq/L (ref 135–145)

## 2020-07-05 LAB — POCT GLYCOSYLATED HEMOGLOBIN (HGB A1C): Hemoglobin A1C: 8.5 % — AB (ref 4.0–5.6)

## 2020-07-05 LAB — T4, FREE: Free T4: 0.65 ng/dL (ref 0.60–1.60)

## 2020-07-05 LAB — TSH: TSH: 1.35 u[IU]/mL (ref 0.35–4.50)

## 2020-07-05 MED ORDER — CANAGLIFLOZIN 300 MG PO TABS: 300.0000 mg | ORAL_TABLET | Freq: Every day | ORAL | 3 refills | Status: DC

## 2020-07-05 NOTE — Progress Notes (Signed)
Subjective:    Patient ID: Albert Reed, male    DOB: 1950/12/11, 69 y.o.   MRN: [id number]  HPI Pt returns for f/u of diabetes mellitus:   DM type: Insulin-requiring type 2.  Dx'ed: 1997.  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   DKA: never.  Severe hypoglycemia: never.  Pancreatitis: never.  SDOH: he takes human insulin, due to cost.  Other: He takes multiple daily injections; the pattern of cbg's suggests he does not need basal insulin; Trulicity dosage is limited by n/v.   Interval history: Meter is downloaded today, and the printout is scanned into the record.  cbg's vary from 70-350.  It is in general higher as the day goes on.  Past Medical History:  Diagnosis Date  . COLONIC POLYPS, HX OF 04/01/2007  . DIVERTICULOSIS, COLON 04/01/2007  . DM 11/24/2008  . HYPERCHOLESTEROLEMIA 07/28/2008  . HYPERTENSION 04/01/2007  . HYPERTHYROIDISM 11/09/2009  . LEUKOPENIA, CHRONIC 07/28/2008    Past Surgical History:  Procedure Laterality Date  . BUNIONECTOMY      Social History   Socioeconomic History  . Marital status: Married    Spouse name: Not on file  . Number of children: Not on file  . Years of education: Not on file  . Highest education level: Not on file  Occupational History  . 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 Smoking status: Never Smoker  . Smokeless tobacco: Never Used  Substance and Sexual Activity  . Alcohol use: No    Alcohol/week: 0.0 standard drinks  . Drug use: No  . Sexual activity: Not on file  Other Topics Concern  . Not on file  Social History Narrative   Originally from [place] (in [place] since [place])   Social Determinants of Health   Financial Resource Strain:   . Difficulty of Paying Living Expenses: Not on file  Food Insecurity:   . Worried About Charity fundraiser in the Last Year: Not on file  . Ran Out of Food in the Last Year: Not on  file  Transportation Needs:   . Lack of Transportation (Medical): Not on file  . Lack of Transportation (Non-Medical): Not on file  Physical Activity:   . Days of Exercise per Week: Not on file  . Minutes of Exercise per Session: Not on file  Stress:   . Feeling of Stress : Not on file  Social Connections:   . Frequency of Communication with Friends and Family: Not on file  . Frequency of Social Gatherings with Friends and Family: Not on file  . Attends Religious Services: Not on file  . Active Member of Clubs or Organizations: Not on file  . Attends Archivist Meetings: Not on file  . Marital Status: Not on file  Intimate Partner Violence:   . Fear of Current or Ex-Partner: Not on file  . Emotionally Abused: Not on file  . Physically Abused: Not on file  . Sexually Abused: Not on file    Current Outpatient Medications on File Prior to Visit  Medication Sig Dispense Refill  . aspirin 81 MG tablet Take 81 mg by mouth daily.      . Blood Glucose Monitoring Suppl (ONETOUCH VERIO FLEX SYSTEM) w/Device KIT 1 each by Does not apply route 2 (two) times daily. E11.9    . Dulaglutide (TRULICITY) 1.5 GU/5.4YH SOPN Inject 1.5 mg into  the skin once a week. 6 mL 3  . insulin regular (NOVOLIN R) 100 units/mL injection Inject 0.3 mLs (30 Units total) into the skin 2 (two) times daily before a meal. 60 mL 3  . Insulin Syringe-Needle U-100 (BD INSULIN SYRINGE ULTRAFINE) 31G X 5/16" 0.5 ML MISC 1 each by Does not apply route daily. 100 each 1  . Lancets (ONETOUCH DELICA PLUS [id number]) MISC 1 each by Other route 2 (two) times daily. E11.9 200 each 0  . Multiple Vitamins-Minerals (CENTRUM SILVER ULTRA MENS) TABS Take 1 tablet by mouth daily.      . ondansetron (ZOFRAN ODT) 4 MG disintegrating tablet Take 1 tablet (4 mg total) by mouth every 8 (eight) hours as needed for nausea or vomiting. 21 tablet 0  . ONETOUCH ULTRA test strip Use One Touch Verio test strip as instructed (Patient taking  differently: 1 each by Other route 2 (two) times daily. E11.9) 100 strip 6   No current facility-administered medications on file prior to visit.    No Known Allergies  Family History  Problem Relation Age of Onset  . Diabetes Brother        Type 2  . Colon cancer Neg Hx        patient dose not know much about family  . Esophageal cancer Neg Hx   . Stomach cancer Neg Hx   . Rectal cancer Neg Hx     BP (!) 122/58   Pulse 96   Ht 5' 4.5" (1.638 m)   Wt 188 lb (85.3 kg)   SpO2 96%   BMI 31.77 kg/m    Review of Systems He denies hypoglycemia.    Objective:   Physical Exam VITAL SIGNS:  See vs page GENERAL: no distress Pulses: dorsalis pedis intact bilat.   MSK: no deformity of the feet CV: no leg edema.  Skin:  no ulcer on the feet.  normal color and temp on the feet.   Neuro: sensation is intact to touch on the feet.     Lab Results  Component Value Date   HGBA1C 8.5 (A) 07/05/2020       Assessment & Plan:  Insulin-requiring type 2 DM: uncontrolled CRI: recheck today  Patient Instructions  Please continue the same insulin and Trulicity Blood tests are requested for you today.  We'll let you know about the results.  I have sent a prescription to your pharmacy, to add "Invokana."  check your blood sugar twice a day.  vary the time of day when you check, between before the 3 meals, and at bedtime.  also check if you have symptoms of your blood sugar being too high or too low.  please keep a record of the readings and bring it to your next appointment here (or you can bring the meter itself).  You can write it on any piece of paper.  please call us sooner if your blood sugar goes below 70, or if you have a lot of readings over 200.  Please come back for a follow-up appointment in 2 months.

## 2020-07-05 NOTE — Patient Instructions (Addendum)
Please continue the same insulin and Trulicity Blood tests are requested for you today.  We'll let you know about the results.  I have sent a prescription to your pharmacy, to add "Invokana."  check your blood sugar twice a day.  vary the time of day when you check, between before the 3 meals, and at bedtime.  also check if you have symptoms of your blood sugar being too high or too low.  please keep a record of the readings and bring it to your next appointment here (or you can bring the meter itself).  You can write it on any piece of paper.  please call us sooner if your blood sugar goes below 70, or if you have a lot of readings over 200.  Please come back for a follow-up appointment in 2 months.

## 2020-07-06 DIAGNOSIS — H40051 Ocular hypertension, right eye: Secondary | ICD-10-CM | POA: Diagnosis not present

## 2020-07-06 DIAGNOSIS — E113512 Type 2 diabetes mellitus with proliferative diabetic retinopathy with macular edema, left eye: Secondary | ICD-10-CM | POA: Diagnosis not present

## 2020-07-06 DIAGNOSIS — H401122 Primary open-angle glaucoma, left eye, moderate stage: Secondary | ICD-10-CM | POA: Diagnosis not present

## 2020-07-26 DIAGNOSIS — H401120 Primary open-angle glaucoma, left eye, stage unspecified: Secondary | ICD-10-CM | POA: Diagnosis not present

## 2020-07-26 DIAGNOSIS — H43811 Vitreous degeneration, right eye: Secondary | ICD-10-CM | POA: Diagnosis not present

## 2020-07-26 DIAGNOSIS — E113391 Type 2 diabetes mellitus with moderate nonproliferative diabetic retinopathy without macular edema, right eye: Secondary | ICD-10-CM | POA: Diagnosis not present

## 2020-07-26 DIAGNOSIS — E113512 Type 2 diabetes mellitus with proliferative diabetic retinopathy with macular edema, left eye: Secondary | ICD-10-CM | POA: Diagnosis not present

## 2020-08-04 DIAGNOSIS — H401122 Primary open-angle glaucoma, left eye, moderate stage: Secondary | ICD-10-CM | POA: Diagnosis not present

## 2020-08-23 DIAGNOSIS — E119 Type 2 diabetes mellitus without complications: Secondary | ICD-10-CM | POA: Diagnosis not present

## 2020-08-23 DIAGNOSIS — E669 Obesity, unspecified: Secondary | ICD-10-CM | POA: Diagnosis not present

## 2020-08-23 DIAGNOSIS — I10 Essential (primary) hypertension: Secondary | ICD-10-CM | POA: Diagnosis not present

## 2020-09-11 ENCOUNTER — Other Ambulatory Visit: Payer: Self-pay

## 2020-09-13 ENCOUNTER — Ambulatory Visit (INDEPENDENT_AMBULATORY_CARE_PROVIDER_SITE_OTHER): Payer: PPO | Admitting: Endocrinology

## 2020-09-13 ENCOUNTER — Other Ambulatory Visit: Payer: Self-pay

## 2020-09-13 VITALS — BP 130/70 | HR 84 | Ht 64.0 in | Wt 186.8 lb

## 2020-09-13 DIAGNOSIS — E119 Type 2 diabetes mellitus without complications: Secondary | ICD-10-CM | POA: Diagnosis not present

## 2020-09-13 DIAGNOSIS — Z794 Long term (current) use of insulin: Secondary | ICD-10-CM

## 2020-09-13 LAB — POCT GLYCOSYLATED HEMOGLOBIN (HGB A1C): Hemoglobin A1C: 7.5 % — AB (ref 4.0–5.6)

## 2020-09-13 MED ORDER — ONETOUCH VERIO VI STRP: 1.0000 | ORAL_STRIP | Freq: Two times a day (BID) | 2 refills | Status: DC

## 2020-09-13 MED ORDER — INSULIN REGULAR HUMAN 100 UNIT/ML IJ SOLN
34.0000 [IU] | Freq: Two times a day (BID) | INTRAMUSCULAR | 3 refills | Status: DC
Start: 2020-09-13 — End: 2020-12-13

## 2020-09-13 NOTE — Progress Notes (Signed)
Subjective:    Patient ID: Albert Reed, male    DOB: 1950/08/26, 70 y.o.   MRN: [id number]  HPI Pt returns for f/u of diabetes mellitus:   DM type: Insulin-requiring type 2.  Dx'ed: 1997.  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   DKA: never.  Severe hypoglycemia: never.  Pancreatitis: never.  SDOH: he takes human insulin, due to cost.  Other: He takes multiple daily injections; the pattern of cbg's suggests he does not need basal insulin; Trulicity dosage is limited by n/v.   Interval history: Meter is downloaded today, and the printout is scanned into the record.  cbg's vary from 77-226.  It is in general higher as the day goes on.  He requests new rx for strips.  Nausea is mild.  Past Medical History:  Diagnosis Date  . COLONIC POLYPS, HX OF 04/01/2007  . DIVERTICULOSIS, COLON 04/01/2007  . DM 11/24/2008  . HYPERCHOLESTEROLEMIA 07/28/2008  . HYPERTENSION 04/01/2007  . HYPERTHYROIDISM 11/09/2009  . LEUKOPENIA, CHRONIC 07/28/2008    Past Surgical History:  Procedure Laterality Date  . BUNIONECTOMY      Social History   Socioeconomic History  . Marital status: Married    Spouse name: Not on file  . Number of children: Not on file  . Years of education: Not on file  . Highest education level: Not on file  Occupational History  . 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 Smoking status: Never Smoker  . Smokeless tobacco: Never Used  Substance and Sexual Activity  . Alcohol use: No    Alcohol/week: 0.0 standard drinks  . Drug use: No  . Sexual activity: Not on file  Other Topics Concern  . Not on file  Social History Narrative   Originally from [place] (in [place] since 1996)   Social Determinants of Health   Financial Resource Strain: Not on [employer] Insecurity: Not on file  Transportation Needs: Not on file  Physical Activity: Not on file  Stress: Not on file   Social Connections: Not on file  Intimate Partner Violence: Not on file    Current Outpatient Medications on File Prior to Visit  Medication Sig Dispense Refill  . aspirin 81 MG tablet Take 81 mg by mouth daily.    . Blood Glucose Monitoring Suppl (ONETOUCH VERIO FLEX SYSTEM) w/Device KIT 1 each by Does not apply route 2 (two) times daily. E11.9    . canagliflozin (INVOKANA) 300 MG TABS tablet Take 1 tablet (300 mg total) by mouth daily before breakfast. 90 tablet 3  . Dulaglutide (TRULICITY) 1.5 JX/9.1YN SOPN Inject 1.5 mg into the skin once a week. 6 mL 3  . Insulin Syringe-Needle U-100 (BD INSULIN SYRINGE ULTRAFINE) 31G X 5/16" 0.5 ML MISC 1 each by Does not apply route daily. 100 each 1  . Lancets (ONETOUCH DELICA PLUS [id number]) MISC 1 each by Other route 2 (two) times daily. E11.9 200 each 0  . Multiple Vitamins-Minerals (CENTRUM SILVER ULTRA MENS) TABS Take 1 tablet by mouth daily.    . ondansetron (ZOFRAN ODT) 4 MG disintegrating tablet Take 1 tablet (4 mg total) by mouth every 8 (eight) hours as needed for nausea or vomiting. 21 tablet 0   No current facility-administered medications on file prior to visit.    No Known Allergies  Family History  Problem Relation Age of Onset  . Diabetes Brother  Type 2  . Colon cancer Neg Hx        patient dose not know much about family  . Esophageal cancer Neg Hx   . Stomach cancer Neg Hx   . Rectal cancer Neg Hx     BP 130/70 (BP Location: Right Arm, Patient Position: Sitting, Cuff Size: Large)   Pulse 84   Ht _0  (1.626 m)   Wt 186 lb 12.8 oz (84.7 kg)   SpO2 98%   BMI 32.06 kg/m    Review of Systems He denies hypoglycemia.      Objective:   Physical Exam VITAL SIGNS:  See vs page GENERAL: no distress Pulses: dorsalis pedis intact bilat.   MSK: no deformity of the feet CV: no leg edema Skin:  no ulcer on the feet.  normal color and temp on the feet.   Neuro: sensation is intact to touch on the feet.     Lab  Results  Component Value Date   HGBA1C 7.5 (A) 09/13/2020       Assessment & Plan:  Insulin-requiring type 2 DM, with CRI: uncontrolled.  Nausea, due to Trulicity: we can't increase, at least for now.   Patient Instructions  Please continue the same Invokana and Trulicity I have sent 2 prescriptions to your pharmacy: increase the insulin, and for the test strips check your blood sugar twice a day.  vary the time of day when you check, between before the 3 meals, and at bedtime.  also check if you have symptoms of your blood sugar being too high or too low.  please keep a record of the readings and bring it to your next appointment here (or you can bring the meter itself).  You can write it on any piece of paper.  please call us sooner if your blood sugar goes below 70, or if you have a lot of readings over 200.  Please come back for a follow-up appointment in 3 months.

## 2020-09-13 NOTE — Patient Instructions (Addendum)
Please continue the same Invokana and Trulicity I have sent 2 prescriptions to your pharmacy: increase the insulin, and for the test strips check your blood sugar twice a day.  vary the time of day when you check, between before the 3 meals, and at bedtime.  also check if you have symptoms of your blood sugar being too high or too low.  please keep a record of the readings and bring it to your next appointment here (or you can bring the meter itself).  You can write it on any piece of paper.  please call us sooner if your blood sugar goes below 70, or if you have a lot of readings over 200.  Please come back for a follow-up appointment in 3 months.

## 2020-10-06 DIAGNOSIS — H401132 Primary open-angle glaucoma, bilateral, moderate stage: Secondary | ICD-10-CM | POA: Diagnosis not present

## 2020-10-12 DIAGNOSIS — H401122 Primary open-angle glaucoma, left eye, moderate stage: Secondary | ICD-10-CM | POA: Diagnosis not present

## 2020-10-18 DIAGNOSIS — E113291 Type 2 diabetes mellitus with mild nonproliferative diabetic retinopathy without macular edema, right eye: Secondary | ICD-10-CM | POA: Diagnosis not present

## 2020-10-18 DIAGNOSIS — H401123 Primary open-angle glaucoma, left eye, severe stage: Secondary | ICD-10-CM | POA: Diagnosis not present

## 2020-10-18 DIAGNOSIS — E113512 Type 2 diabetes mellitus with proliferative diabetic retinopathy with macular edema, left eye: Secondary | ICD-10-CM | POA: Diagnosis not present

## 2020-10-30 DIAGNOSIS — H401132 Primary open-angle glaucoma, bilateral, moderate stage: Secondary | ICD-10-CM | POA: Diagnosis not present

## 2020-11-22 DIAGNOSIS — E669 Obesity, unspecified: Secondary | ICD-10-CM | POA: Diagnosis not present

## 2020-11-22 DIAGNOSIS — I10 Essential (primary) hypertension: Secondary | ICD-10-CM | POA: Diagnosis not present

## 2020-11-22 DIAGNOSIS — E119 Type 2 diabetes mellitus without complications: Secondary | ICD-10-CM | POA: Diagnosis not present

## 2020-12-13 ENCOUNTER — Ambulatory Visit (INDEPENDENT_AMBULATORY_CARE_PROVIDER_SITE_OTHER): Payer: PPO | Admitting: Endocrinology

## 2020-12-13 ENCOUNTER — Other Ambulatory Visit: Payer: Self-pay

## 2020-12-13 VITALS — BP 152/70 | HR 85 | Ht 64.0 in | Wt 180.0 lb

## 2020-12-13 DIAGNOSIS — E119 Type 2 diabetes mellitus without complications: Secondary | ICD-10-CM | POA: Diagnosis not present

## 2020-12-13 DIAGNOSIS — Z794 Long term (current) use of insulin: Secondary | ICD-10-CM

## 2020-12-13 LAB — POCT GLYCOSYLATED HEMOGLOBIN (HGB A1C): Hemoglobin A1C: 8 % — AB (ref 4.0–5.6)

## 2020-12-13 MED ORDER — INSULIN REGULAR HUMAN 100 UNIT/ML IJ SOLN
INTRAMUSCULAR | 3 refills | Status: DC
Start: 2020-12-13 — End: 2021-03-14

## 2020-12-13 NOTE — Progress Notes (Signed)
Subjective:    Patient ID: Albert Reed, male    DOB: Nov 14, 1950, 70 y.o.   MRN: [id number]  HPI Pt returns for f/u of diabetes mellitus:   DM type: Insulin-requiring type 2.  Dx'ed: 1997.  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   DKA: never.  Severe hypoglycemia: never.  Pancreatitis: never.  SDOH: he takes human insulin, due to cost.  Other: He takes multiple daily injections; the pattern of cbg's suggests he does not need basal insulin; Trulicity dosage is limited by n/v.   Interval history: Meter is downloaded today, and the printout is scanned into the record.  cbg's vary from 50-452.  It is in general higher as the day goes on.  He requests new rx for strips.   Past Medical History:  Diagnosis Date  . COLONIC POLYPS, HX OF 04/01/2007  . DIVERTICULOSIS, COLON 04/01/2007  . DM 11/24/2008  . HYPERCHOLESTEROLEMIA 07/28/2008  . HYPERTENSION 04/01/2007  . HYPERTHYROIDISM 11/09/2009  . LEUKOPENIA, CHRONIC 07/28/2008    Past Surgical History:  Procedure Laterality Date  . BUNIONECTOMY      Social History   Socioeconomic History  . Marital status: Married    Spouse name: Not on file  . Number of children: Not on file  . Years of education: Not on file  . Highest education level: Not on file  Occupational History  . 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 Smoking status: Never Smoker  . Smokeless tobacco: Never Used  Substance and Sexual Activity  . Alcohol use: No    Alcohol/week: 0.0 standard drinks  . Drug use: No  . Sexual activity: Not on file  Other Topics Concern  . Not on file  Social History Narrative   Originally from [place] (in [place] since 1996)   Social Determinants of Health   Financial Resource Strain: Not on [employer] Insecurity: Not on file  Transportation Needs: Not on file  Physical Activity: Not on file  Stress: Not on file  Social  Connections: Not on file  Intimate Partner Violence: Not on file    Current Outpatient Medications on File Prior to Visit  Medication Sig Dispense Refill  . aspirin 81 MG tablet Take 81 mg by mouth daily.    . Blood Glucose Monitoring Suppl (ONETOUCH VERIO FLEX SYSTEM) w/Device KIT 1 each by Does not apply route 2 (two) times daily. E11.9    . canagliflozin (INVOKANA) 300 MG TABS tablet Take 1 tablet (300 mg total) by mouth daily before breakfast. 90 tablet 3  . Dulaglutide (TRULICITY) 1.5 MW/4.1LK SOPN Inject 1.5 mg into the skin once a week. 6 mL 3  . glucose blood (ONETOUCH VERIO) test strip 1 each by Other route in the morning and at bedtime. And lancets 2/day 200 each 2  . Insulin Syringe-Needle U-100 (BD INSULIN SYRINGE ULTRAFINE) 31G X 5/16" 0.5 ML MISC 1 each by Does not apply route daily. 100 each 1  . Lancets (ONETOUCH DELICA PLUS [id number]) MISC 1 each by Other route 2 (two) times daily. E11.9 200 each 0  . Multiple Vitamins-Minerals (CENTRUM SILVER ULTRA MENS) TABS Take 1 tablet by mouth daily.    . ondansetron (ZOFRAN ODT) 4 MG disintegrating tablet Take 1 tablet (4 mg total) by mouth every 8 (eight) hours as needed for nausea or vomiting. 21 tablet 0   No current facility-administered medications on  file prior to visit.    No Known Allergies  Family History  Problem Relation Age of Onset  . Diabetes Brother        Type 2  . Colon cancer Neg Hx        patient dose not know much about family  . Esophageal cancer Neg Hx   . Stomach cancer Neg Hx   . Rectal cancer Neg Hx     BP (!) 152/70 (BP Location: Right Arm, Patient Position: Sitting, Cuff Size: Normal)   Pulse 85   Ht $R'5\' 4"'Om$  (1.626 m)   Wt 180 lb (81.6 kg)   SpO2 98%   BMI 30.90 kg/m    Review of Systems No recent n/v/heartburn.      Objective:   Physical Exam VITAL SIGNS:  See vs page GENERAL: no distress Pulses: dorsalis pedis intact bilat.   MSK: no deformity of the feet CV: no leg edema Skin:  no  ulcer on the feet.  normal color and temp on the feet. Neuro: sensation is intact to touch on the feet  Lab Results  Component Value Date   HGBA1C 8.0 (A) 12/13/2020      Assessment & Plan:  Insulin-requiring type 2 DM: uncontrolled.   Hypoglycemia, due to insulin: I advised him to favor GLP rx, but He declines to increase Trulicity now.    Patient Instructions  Please continue the same Invokana and Trulicity.   Please change the insulin to 45 units with breakfast, and 25 units with supper.  check your blood sugar twice a day.  vary the time of day when you check, between before the 3 meals, and at bedtime.  also check if you have symptoms of your blood sugar being too high or too low.  please keep a record of the readings and bring it to your next appointment here (or you can bring the meter itself).  You can write it on any piece of paper.  please call us sooner if your blood sugar goes below 70, or if you have a lot of readings over 200.  Please come back for a follow-up appointment in 3 months.

## 2020-12-13 NOTE — Patient Instructions (Addendum)
Please continue the same Invokana and Trulicity.   Please change the insulin to 45 units with breakfast, and 25 units with supper.  check your blood sugar twice a day.  vary the time of day when you check, between before the 3 meals, and at bedtime.  also check if you have symptoms of your blood sugar being too high or too low.  please keep a record of the readings and bring it to your next appointment here (or you can bring the meter itself).  You can write it on any piece of paper.  please call us sooner if your blood sugar goes below 70, or if you have a lot of readings over 200.  Please come back for a follow-up appointment in 3 months.

## 2021-01-24 DIAGNOSIS — H401120 Primary open-angle glaucoma, left eye, stage unspecified: Secondary | ICD-10-CM | POA: Diagnosis not present

## 2021-01-24 DIAGNOSIS — H43811 Vitreous degeneration, right eye: Secondary | ICD-10-CM | POA: Diagnosis not present

## 2021-01-24 DIAGNOSIS — E113592 Type 2 diabetes mellitus with proliferative diabetic retinopathy without macular edema, left eye: Secondary | ICD-10-CM | POA: Diagnosis not present

## 2021-01-24 DIAGNOSIS — E113391 Type 2 diabetes mellitus with moderate nonproliferative diabetic retinopathy without macular edema, right eye: Secondary | ICD-10-CM | POA: Diagnosis not present

## 2021-02-23 DIAGNOSIS — H401132 Primary open-angle glaucoma, bilateral, moderate stage: Secondary | ICD-10-CM | POA: Diagnosis not present

## 2021-02-28 DIAGNOSIS — I10 Essential (primary) hypertension: Secondary | ICD-10-CM | POA: Diagnosis not present

## 2021-02-28 DIAGNOSIS — E1165 Type 2 diabetes mellitus with hyperglycemia: Secondary | ICD-10-CM | POA: Diagnosis not present

## 2021-02-28 DIAGNOSIS — E669 Obesity, unspecified: Secondary | ICD-10-CM | POA: Diagnosis not present

## 2021-03-14 ENCOUNTER — Other Ambulatory Visit: Payer: Self-pay

## 2021-03-14 ENCOUNTER — Ambulatory Visit (INDEPENDENT_AMBULATORY_CARE_PROVIDER_SITE_OTHER): Payer: PPO | Admitting: Endocrinology

## 2021-03-14 ENCOUNTER — Encounter: Payer: Self-pay | Admitting: Endocrinology

## 2021-03-14 VITALS — BP 120/56 | HR 85 | Ht 64.0 in | Wt 181.6 lb

## 2021-03-14 DIAGNOSIS — Z794 Long term (current) use of insulin: Secondary | ICD-10-CM

## 2021-03-14 DIAGNOSIS — E119 Type 2 diabetes mellitus without complications: Secondary | ICD-10-CM

## 2021-03-14 MED ORDER — INSULIN REGULAR HUMAN 100 UNIT/ML IJ SOLN
INTRAMUSCULAR | 3 refills | Status: DC
Start: 2021-03-14 — End: 2021-11-21

## 2021-03-14 NOTE — Patient Instructions (Addendum)
Please continue the same Invokana and Trulicity.   Please change the insulin to 55 units with breakfast, and 20 units with supper.    check your blood sugar twice a day.  vary the time of day when you check, between before the 3 meals, and at bedtime.  also check if you have symptoms of your blood sugar being too high or too low.  please keep a record of the readings and bring it to your next appointment here (or you can bring the meter itself).  You can write it on any piece of paper.  please call us sooner if your blood sugar goes below 70, or if you have a lot of readings over 200.  Please come back for a follow-up appointment in 4 months.

## 2021-03-14 NOTE — Progress Notes (Signed)
Subjective:    Patient ID: Albert Reed, male    DOB: 01-25-1951, 70 y.o.   MRN: [id number]  HPI Pt returns for f/u of diabetes mellitus:   DM type: Insulin-requiring type 2.  Dx'ed: 1997.  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   DKA: never.  Severe hypoglycemia: never.  Pancreatitis: never.  SDOH: he takes human insulin, due to cost.  Other: He takes multiple daily injections; the pattern of cbg's suggests he does not need basal insulin; Trulicity dosage is limited by n/v.   Interval history: Meter is downloaded today, and the printout is scanned into the record.  cbg's vary from 80-220.  It is in general higher as the day goes on.  He requests new rx for strips.  Past Medical History:  Diagnosis Date   COLONIC POLYPS, HX OF 04/01/2007   DIVERTICULOSIS, COLON 04/01/2007   DM 11/24/2008   HYPERCHOLESTEROLEMIA 07/28/2008   HYPERTENSION 04/01/2007   HYPERTHYROIDISM 11/09/2009   LEUKOPENIA, CHRONIC 07/28/2008    Past Surgical History:  Procedure Laterality Date   BUNIONECTOMY      Social History   Socioeconomic History   Marital status: Married    Spouse name: Not on file   Number of children: Not on file   Years of education: Not on file   Highest education level: Not on file  Occupational History   Occupation: WORKS [employer]    Employer: [employer]    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  Tobacco Use   Smoking status: Never   Smokeless tobacco: Never  Substance and Sexual Activity   Alcohol use: No    Alcohol/week: 0.0 standard drinks   Drug use: No   Sexual activity: Not on file  Other Topics Concern   Not on file  Social History Narrative   Originally from [place] (in [place] since [place])   Social Determinants of Health   Financial Resource Strain: Not on file  Food Insecurity: Not on file  Transportation Needs: Not on file  Physical Activity: Not on file  Stress: Not on file  Social Connections: Not on file  Intimate  Partner Violence: Not on file    Current Outpatient Medications on File Prior to Visit  Medication Sig Dispense Refill   aspirin 81 MG tablet Take 81 mg by mouth daily.     Blood Glucose Monitoring Suppl (ONETOUCH VERIO FLEX SYSTEM) w/Device KIT 1 each by Does not apply route 2 (two) times daily. E11.9     canagliflozin (INVOKANA) 300 MG TABS tablet Take 1 tablet (300 mg total) by mouth daily before breakfast. 90 tablet 3   Dulaglutide (TRULICITY) 1.5 XB/3.5HG SOPN Inject 1.5 mg into the skin once a week. 6 mL 3   glucose blood (ONETOUCH VERIO) test strip 1 each by Other route in the morning and at bedtime. And lancets 2/day 200 each 2   Insulin Syringe-Needle U-100 (BD INSULIN SYRINGE ULTRAFINE) 31G X 5/16" 0.5 ML MISC 1 each by Does not apply route daily. 100 each 1   Lancets (ONETOUCH DELICA PLUS [id number]) MISC 1 each by Other route 2 (two) times daily. E11.9 200 each 0   Multiple Vitamins-Minerals (CENTRUM SILVER ULTRA MENS) TABS Take 1 tablet by mouth daily.     ondansetron (ZOFRAN ODT) 4 MG disintegrating tablet Take 1 tablet (4 mg total) by mouth every 8 (eight) hours as needed for nausea or vomiting. 21 tablet 0   No current facility-administered medications on file prior to  visit.    No Known Allergies  Family History  Problem Relation Age of Onset   Diabetes Brother        Type 2   Colon cancer Neg Hx        patient dose not know much about family   Esophageal cancer Neg Hx    Stomach cancer Neg Hx    Rectal cancer Neg Hx     BP (!) 120/56 (BP Location: Right Arm, Patient Position: Sitting, Cuff Size: Large)   Pulse 85   Ht $R'5\' 4"'ry$  (1.626 m)   Wt 181 lb 9.6 oz (82.4 kg)   SpO2 97%   BMI 31.17 kg/m   Review of Systems Denies n/v.  He denies hypoglycemia.      Objective:   Physical Exam Pulses: dorsalis pedis intact bilat.   MSK: no deformity of the feet CV: no leg edema Skin:  no ulcer on the feet.  normal color and temp on the feet. Neuro: sensation is intact  to touch on the feet   A1c=7.4%    Assessment & Plan:  Insulin-requiring type 2 DM: uncontrolled.    Patient Instructions  Please continue the same Invokana and Trulicity.   Please change the insulin to 55 units with breakfast, and 20 units with supper.    check your blood sugar twice a day.  vary the time of day when you check, between before the 3 meals, and at bedtime.  also check if you have symptoms of your blood sugar being too high or too low.  please keep a record of the readings and bring it to your next appointment here (or you can bring the meter itself).  You can write it on any piece of paper.  please call us sooner if your blood sugar goes below 70, or if you have a lot of readings over 200.  Please come back for a follow-up appointment in 4 months.

## 2021-03-26 ENCOUNTER — Other Ambulatory Visit: Payer: Self-pay | Admitting: Endocrinology

## 2021-04-03 ENCOUNTER — Other Ambulatory Visit: Payer: Self-pay | Admitting: Endocrinology

## 2021-05-02 DIAGNOSIS — E113291 Type 2 diabetes mellitus with mild nonproliferative diabetic retinopathy without macular edema, right eye: Secondary | ICD-10-CM | POA: Diagnosis not present

## 2021-05-02 DIAGNOSIS — H401123 Primary open-angle glaucoma, left eye, severe stage: Secondary | ICD-10-CM | POA: Diagnosis not present

## 2021-05-02 DIAGNOSIS — E113512 Type 2 diabetes mellitus with proliferative diabetic retinopathy with macular edema, left eye: Secondary | ICD-10-CM | POA: Diagnosis not present

## 2021-06-01 DIAGNOSIS — H401132 Primary open-angle glaucoma, bilateral, moderate stage: Secondary | ICD-10-CM | POA: Diagnosis not present

## 2021-06-20 DIAGNOSIS — E559 Vitamin D deficiency, unspecified: Secondary | ICD-10-CM | POA: Diagnosis not present

## 2021-06-20 DIAGNOSIS — Z0000 Encounter for general adult medical examination without abnormal findings: Secondary | ICD-10-CM | POA: Diagnosis not present

## 2021-06-20 DIAGNOSIS — Z125 Encounter for screening for malignant neoplasm of prostate: Secondary | ICD-10-CM | POA: Diagnosis not present

## 2021-06-20 DIAGNOSIS — I10 Essential (primary) hypertension: Secondary | ICD-10-CM | POA: Diagnosis not present

## 2021-06-20 DIAGNOSIS — E119 Type 2 diabetes mellitus without complications: Secondary | ICD-10-CM | POA: Diagnosis not present

## 2021-07-18 ENCOUNTER — Ambulatory Visit (INDEPENDENT_AMBULATORY_CARE_PROVIDER_SITE_OTHER): Payer: PPO | Admitting: Endocrinology

## 2021-07-18 ENCOUNTER — Other Ambulatory Visit: Payer: Self-pay

## 2021-07-18 VITALS — BP 130/62 | HR 75 | Ht 64.0 in | Wt 182.8 lb

## 2021-07-18 DIAGNOSIS — E119 Type 2 diabetes mellitus without complications: Secondary | ICD-10-CM

## 2021-07-18 DIAGNOSIS — Z794 Long term (current) use of insulin: Secondary | ICD-10-CM

## 2021-07-18 LAB — POCT GLYCOSYLATED HEMOGLOBIN (HGB A1C): Hemoglobin A1C: 7 % — AB (ref 4.0–5.6)

## 2021-07-18 MED ORDER — FREESTYLE LIBRE 2 SENSOR MISC: 1.0000 | 3 refills | Status: DC

## 2021-07-18 NOTE — Patient Instructions (Addendum)
Please continue the same Invokana and Trulicity.   Please change the insulin to 55 units with breakfast, and 20 units with supper.    I have sent a prescription to your pharmacy, for the continuous glucose monitor.   check your blood sugar twice a day.  vary the time of day when you check, between before the 3 meals, and at bedtime.  also check if you have symptoms of your blood sugar being too high or too low.  please keep a record of the readings and bring it to your next appointment here (or you can bring the meter itself).  You can write it on any piece of paper.  please call us sooner if your blood sugar goes below 70, or if you have a lot of readings over 200.  Please come back for a follow-up appointment in 4 months.

## 2021-07-18 NOTE — Progress Notes (Signed)
Subjective:    Patient ID: Albert Reed, male    DOB: January 21, 1951, 70 y.o.   MRN: [id number]  HPI Pt returns for f/u of diabetes mellitus:   DM type: Insulin-requiring type 2.  Dx'ed: 1997.  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   DKA: never.  Severe hypoglycemia: never.  Pancreatitis: never.  SDOH: he takes human insulin, due to cost.   Other: He takes multiple daily injections; the pattern of cbg's suggests he does not need basal insulin; Trulicity dosage is limited by n/v.   Interval history: Meter is downloaded today, and the printout is scanned into the record.  cbg's vary from 90-310.  It is in general higher at HS than fasting (the only 2 times of day he checks).  He takes 50 units qam, and 25 units QPM.   Past Medical History:  Diagnosis Date   COLONIC POLYPS, HX OF 04/01/2007   DIVERTICULOSIS, COLON 04/01/2007   DM 11/24/2008   HYPERCHOLESTEROLEMIA 07/28/2008   HYPERTENSION 04/01/2007   HYPERTHYROIDISM 11/09/2009   LEUKOPENIA, CHRONIC 07/28/2008    Past Surgical History:  Procedure Laterality Date   BUNIONECTOMY      Social History   Socioeconomic History   Marital status: Married    Spouse name: Not on file   Number of children: Not on file   Years of education: Not on file   Highest education level: Not on file  Occupational History   Occupation: WORKS [employer]    Employer: [employer]    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  Tobacco Use   Smoking status: Never   Smokeless tobacco: Never  Substance and Sexual Activity   Alcohol use: No    Alcohol/week: 0.0 standard drinks   Drug use: No   Sexual activity: Not on file  Other Topics Concern   Not on file  Social History Narrative   Originally from [place] (in [place] since 1996)   Social Determinants of Health   Financial Resource Strain: Not on file  Food Insecurity: Not on file  Transportation Needs: Not on file  Physical Activity: Not on file  Stress: Not on  file  Social Connections: Not on file  Intimate Partner Violence: Not on file    Current Outpatient Medications on File Prior to Visit  Medication Sig Dispense Refill   aspirin 81 MG tablet Take 81 mg by mouth daily.     Blood Glucose Monitoring Suppl (ONETOUCH VERIO FLEX SYSTEM) w/Device KIT 1 each by Does not apply route 2 (two) times daily. E11.9     Dulaglutide (TRULICITY) 1.5 MG/0.5ML SOPN Inject 1.5 mg into the skin once a week. 6 mL 3   insulin regular (NOVOLIN R) 100 units/mL injection 55 units with breakfast, and 20 units with supper. 70 mL 3   Insulin Syringe-Needle U-100 (BD INSULIN SYRINGE ULTRAFINE) 31G X 5/16" 0.5 ML MISC 1 each by Does not apply route daily. 100 each 1   INVOKANA 300 MG TABS tablet Take 1 tablet (300 mg total) by mouth daily before breakfast. 90 tablet 3   Lancets (ONETOUCH DELICA PLUS LANCET33G) MISC 1 each by Other route 2 (two) times daily. E11.9 200 each 0   Multiple Vitamins-Minerals (CENTRUM SILVER ULTRA MENS) TABS Take 1 tablet by mouth daily.     ondansetron (ZOFRAN ODT) 4 MG disintegrating tablet Take 1 tablet (4 mg total) by mouth every 8 (eight) hours as needed for nausea or vomiting. 21 tablet 0   ONETOUCH  VERIO test strip 1 each by Other route in the morning and at bedtime. And lancets 2/day 200 strip 2   No current facility-administered medications on file prior to visit.    No Known Allergies  Family History  Problem Relation Age of Onset   Diabetes Brother        Type 2   Colon cancer Neg Hx        patient dose not know much about family   Esophageal cancer Neg Hx    Stomach cancer Neg Hx    Rectal cancer Neg Hx     BP 130/62   Pulse 75   Ht $R'5\' 4"'AE$  (1.626 m)   Wt 182 lb 12.8 oz (82.9 kg)   SpO2 97%   BMI 31.38 kg/m    Review of Systems     Objective:   Physical Exam    Lab Results  Component Value Date   HGBA1C 7.0 (A) 07/18/2021      Assessment & Plan:  Insulin-requiring type 2 DM: uncontrolled.  The pattern of  his cbg's indicates he needs some adjustment in his therapy  Patient Instructions  Please continue the same Invokana and Trulicity.   Please change the insulin to 55 units with breakfast, and 20 units with supper.    I have sent a prescription to your pharmacy, for the continuous glucose monitor.   check your blood sugar twice a day.  vary the time of day when you check, between before the 3 meals, and at bedtime.  also check if you have symptoms of your blood sugar being too high or too low.  please keep a record of the readings and bring it to your next appointment here (or you can bring the meter itself).  You can write it on any piece of paper.  please call us sooner if your blood sugar goes below 70, or if you have a lot of readings over 200.  Please come back for a follow-up appointment in 4 months.

## 2021-07-25 DIAGNOSIS — E113391 Type 2 diabetes mellitus with moderate nonproliferative diabetic retinopathy without macular edema, right eye: Secondary | ICD-10-CM | POA: Diagnosis not present

## 2021-07-25 DIAGNOSIS — E113592 Type 2 diabetes mellitus with proliferative diabetic retinopathy without macular edema, left eye: Secondary | ICD-10-CM | POA: Diagnosis not present

## 2021-07-25 DIAGNOSIS — H43811 Vitreous degeneration, right eye: Secondary | ICD-10-CM | POA: Diagnosis not present

## 2021-07-25 DIAGNOSIS — H401120 Primary open-angle glaucoma, left eye, stage unspecified: Secondary | ICD-10-CM | POA: Diagnosis not present

## 2021-09-19 DIAGNOSIS — E119 Type 2 diabetes mellitus without complications: Secondary | ICD-10-CM | POA: Diagnosis not present

## 2021-09-19 DIAGNOSIS — I10 Essential (primary) hypertension: Secondary | ICD-10-CM | POA: Diagnosis not present

## 2021-09-19 DIAGNOSIS — E669 Obesity, unspecified: Secondary | ICD-10-CM | POA: Diagnosis not present

## 2021-09-24 ENCOUNTER — Encounter: Payer: Self-pay | Admitting: Endocrinology

## 2021-09-26 ENCOUNTER — Other Ambulatory Visit (HOSPITAL_COMMUNITY): Payer: Self-pay

## 2021-10-03 ENCOUNTER — Other Ambulatory Visit: Payer: Self-pay | Admitting: Endocrinology

## 2021-10-03 ENCOUNTER — Encounter: Payer: Self-pay | Admitting: Endocrinology

## 2021-10-03 MED ORDER — DAPAGLIFLOZIN PROPANEDIOL 10 MG PO TABS: 10.0000 mg | ORAL_TABLET | Freq: Every day | ORAL | 3 refills | Status: DC

## 2021-10-05 DIAGNOSIS — H40051 Ocular hypertension, right eye: Secondary | ICD-10-CM | POA: Diagnosis not present

## 2021-10-05 DIAGNOSIS — H401123 Primary open-angle glaucoma, left eye, severe stage: Secondary | ICD-10-CM | POA: Diagnosis not present

## 2021-10-17 DIAGNOSIS — B169 Acute hepatitis B without delta-agent and without hepatic coma: Secondary | ICD-10-CM | POA: Diagnosis not present

## 2021-10-17 DIAGNOSIS — F419 Anxiety disorder, unspecified: Secondary | ICD-10-CM | POA: Diagnosis not present

## 2021-11-21 ENCOUNTER — Encounter: Payer: Self-pay | Admitting: Endocrinology

## 2021-11-21 ENCOUNTER — Ambulatory Visit (INDEPENDENT_AMBULATORY_CARE_PROVIDER_SITE_OTHER): Payer: PPO | Admitting: Endocrinology

## 2021-11-21 VITALS — BP 138/76 | HR 80 | Ht 64.0 in | Wt 180.4 lb

## 2021-11-21 DIAGNOSIS — E119 Type 2 diabetes mellitus without complications: Secondary | ICD-10-CM | POA: Diagnosis not present

## 2021-11-21 DIAGNOSIS — Z794 Long term (current) use of insulin: Secondary | ICD-10-CM

## 2021-11-21 LAB — POCT GLYCOSYLATED HEMOGLOBIN (HGB A1C): Hemoglobin A1C: 7.8 % — AB (ref 4.0–5.6)

## 2021-11-21 MED ORDER — INSULIN REGULAR HUMAN 100 UNIT/ML IJ SOLN: INTRAMUSCULAR | 3 refills | Status: DC

## 2021-11-21 MED ORDER — FREESTYLE LIBRE 2 READER DEVI: 1.0000 | Freq: Once | 1 refills | Status: AC

## 2021-11-21 NOTE — Patient Instructions (Addendum)
Please continue the same Invokana and Trulicity.   ?Please change the insulin to 60 units with breakfast, and 20 units with supper.    ?I have sent a prescription to your pharmacy, for the continuous glucose monitor reader.  ?check your blood sugar twice a day.  vary the time of day when you check, between before the 3 meals, and at bedtime.  also check if you have symptoms of your blood sugar being too high or too low.  please keep a record of the readings and bring it to your next appointment here (or you can bring the meter itself).  You can write it on any piece of paper.  please call us sooner if your blood sugar goes below 70, or if you have a lot of readings over 200.   ?Please come back for a follow-up appointment in 3-4 months.  ?

## 2021-11-21 NOTE — Progress Notes (Signed)
? ?Subjective:  ? ? Patient ID: Albert Reed, male    DOB: Sep 10, 1950, 71 y.o.   MRN: [id number] ? ?HPI ?Pt returns for f/u of diabetes mellitus:   ?DM type: Insulin-requiring type 2.  ?Dx'ed: 1997.  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   ?DKA: never.  ?Severe hypoglycemia: never.  ?Pancreatitis: never.  ?SDOH: he takes human insulin, due to cost.   ?Other: He takes BID R insulin; the pattern of cbg's suggests he does not need basal insulin;Trulicity dosage is limited by n/v.   ?Interval history: He brings his meter with his cbg's which I have reviewed today. cbg's vary from 66-471.  It is in general lowest in the middle of the night.  Phone is not c/w FL sensors.  He takes 55 units with breakfast, and 25 with supper.   ?Past Medical History:  ?Diagnosis Date  ? COLONIC POLYPS, HX OF 04/01/2007  ? DIVERTICULOSIS, COLON 04/01/2007  ? DM 11/24/2008  ? HYPERCHOLESTEROLEMIA 07/28/2008  ? HYPERTENSION 04/01/2007  ? HYPERTHYROIDISM 11/09/2009  ? LEUKOPENIA, CHRONIC 07/28/2008  ? ? ?Past Surgical History:  ?Procedure Laterality Date  ? BUNIONECTOMY    ? ? ?Social History  ? ?Socioeconomic History  ? Marital status: Married  ?  Spouse name: Not on file  ? Number of children: Not on file  ? Years of education: Not on file  ? Highest education level: Not on file  ?Occupational History  ? Occupation: WORKS SECOND SHIFT  ?  Employer: [employer]  ?  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 Smoking status: Never  ? Smokeless tobacco: Never  ?Substance and Sexual Activity  ? Alcohol use: No  ?  Alcohol/week: 0.0 standard drinks  ? Drug use: No  ? Sexual activity: Not on file  ?Other Topics Concern  ? Not on file  ?Social History Narrative  ? Originally from [place] (in [place] since 1996)  ? ?Social Determinants of Health  ? ?Financial Resource Strain: Not on file  ?Food Insecurity: Not on file  ?Transportation Needs: Not on file  ?Physical Activity: Not on file  ?Stress: Not on file   ?Social Connections: Not on file  ?Intimate Partner Violence: Not on file  ? ? ?Current Outpatient Medications on File Prior to Visit  ?Medication Sig Dispense Refill  ? aspirin 81 MG tablet Take 81 mg by mouth daily.    ? Blood Glucose Monitoring Suppl (ONETOUCH VERIO FLEX SYSTEM) w/Device KIT 1 each by Does not apply route 2 (two) times daily. E11.9    ? Continuous Blood Gluc Sensor (FREESTYLE LIBRE 2 SENSOR) MISC 1 Device by Does not apply route every 14 (fourteen) days. 6 each 3  ? dapagliflozin propanediol (FARXIGA) 10 MG TABS tablet Take 1 tablet (10 mg total) by mouth daily before breakfast. 90 tablet 3  ? Dulaglutide (TRULICITY) 1.5 CH/8.5ID SOPN Inject 1.5 mg into the skin once a week. 6 mL 3  ? Insulin Syringe-Needle U-100 (BD INSULIN SYRINGE ULTRAFINE) 31G X 5/16" 0.5 ML MISC 1 each by Does not apply route daily. 100 each 1  ? Lancets (ONETOUCH DELICA PLUS [id number]) MISC 1 each by Other route 2 (two) times daily. E11.9 200 each 0  ? Multiple Vitamins-Minerals (CENTRUM SILVER ULTRA MENS) TABS Take 1 tablet by mouth daily.    ? ondansetron (ZOFRAN ODT) 4 MG disintegrating tablet Take 1 tablet (4 mg total) by mouth every 8 (eight) hours as needed for nausea or vomiting.  21 tablet 0  ? ONETOUCH VERIO test strip 1 each by Other route in the morning and at bedtime. And lancets 2/day 200 strip 2  ? ?No current facility-administered medications on file prior to visit.  ? ? ?No Known Allergies ? ?Family History  ?Problem Relation Age of Onset  ? Diabetes Brother   ?     Type 2  ? Colon cancer Neg Hx   ?     patient dose not know much about family  ? Esophageal cancer Neg Hx   ? Stomach cancer Neg Hx   ? Rectal cancer Neg Hx   ? ? ?BP 138/76 (BP Location: Left Arm, Patient Position: Sitting, Cuff Size: Normal)   Pulse 80   Ht $R'5\' 4"'mP$  (1.626 m)   Wt 180 lb 6.4 oz (81.8 kg)   SpO2 96%   BMI 30.97 kg/m?  ? ? ?Review of Systems ?Denies N/HB ?   ?Objective:  ? Physical Exam ?VITAL SIGNS:  See vs page ?GENERAL: no  distress ? ? ?A1c=7.8% ? ?   ?Assessment & Plan:  ?Insulin-requiring type 2 DM: uncontrolled ?Hypoglycemia, due to insulin: The pattern of his cbg's indicates he needs some adjustment in his therapy ? ?Patient Instructions  ?Please continue the same Invokana and Trulicity.   ?Please change the insulin to 60 units with breakfast, and 20 units with supper.    ?I have sent a prescription to your pharmacy, for the continuous glucose monitor reader.  ?check your blood sugar twice a day.  vary the time of day when you check, between before the 3 meals, and at bedtime.  also check if you have symptoms of your blood sugar being too high or too low.  please keep a record of the readings and bring it to your next appointment here (or you can bring the meter itself).  You can write it on any piece of paper.  please call us sooner if your blood sugar goes below 70, or if you have a lot of readings over 200.   ?Please come back for a follow-up appointment in 3-4 months.  ? ? ?

## 2022-01-22 DIAGNOSIS — E1165 Type 2 diabetes mellitus with hyperglycemia: Secondary | ICD-10-CM | POA: Diagnosis not present

## 2022-01-22 DIAGNOSIS — Z418 Encounter for other procedures for purposes other than remedying health state: Secondary | ICD-10-CM | POA: Diagnosis not present

## 2022-01-22 DIAGNOSIS — I10 Essential (primary) hypertension: Secondary | ICD-10-CM | POA: Diagnosis not present

## 2022-01-23 DIAGNOSIS — H401120 Primary open-angle glaucoma, left eye, stage unspecified: Secondary | ICD-10-CM | POA: Diagnosis not present

## 2022-01-23 DIAGNOSIS — E113512 Type 2 diabetes mellitus with proliferative diabetic retinopathy with macular edema, left eye: Secondary | ICD-10-CM | POA: Diagnosis not present

## 2022-01-23 DIAGNOSIS — E113391 Type 2 diabetes mellitus with moderate nonproliferative diabetic retinopathy without macular edema, right eye: Secondary | ICD-10-CM | POA: Diagnosis not present

## 2022-01-23 DIAGNOSIS — H35373 Puckering of macula, bilateral: Secondary | ICD-10-CM | POA: Diagnosis not present

## 2022-01-30 DIAGNOSIS — E113591 Type 2 diabetes mellitus with proliferative diabetic retinopathy without macular edema, right eye: Secondary | ICD-10-CM | POA: Diagnosis not present

## 2022-01-30 DIAGNOSIS — E113512 Type 2 diabetes mellitus with proliferative diabetic retinopathy with macular edema, left eye: Secondary | ICD-10-CM | POA: Diagnosis not present

## 2022-02-22 ENCOUNTER — Telehealth: Payer: Self-pay

## 2022-02-22 NOTE — Telephone Encounter (Signed)
Attempted to contact the patient in regards to rescheduling with another provider, LVM for a call back. 

## 2022-03-21 DIAGNOSIS — H401123 Primary open-angle glaucoma, left eye, severe stage: Secondary | ICD-10-CM | POA: Diagnosis not present

## 2022-03-21 DIAGNOSIS — H40051 Ocular hypertension, right eye: Secondary | ICD-10-CM | POA: Diagnosis not present

## 2022-04-24 ENCOUNTER — Other Ambulatory Visit: Payer: Self-pay | Admitting: Internal Medicine

## 2022-04-24 DIAGNOSIS — Z125 Encounter for screening for malignant neoplasm of prostate: Secondary | ICD-10-CM | POA: Diagnosis not present

## 2022-04-24 DIAGNOSIS — Z23 Encounter for immunization: Secondary | ICD-10-CM | POA: Diagnosis not present

## 2022-04-24 DIAGNOSIS — Z0000 Encounter for general adult medical examination without abnormal findings: Secondary | ICD-10-CM | POA: Diagnosis not present

## 2022-04-24 DIAGNOSIS — E559 Vitamin D deficiency, unspecified: Secondary | ICD-10-CM | POA: Diagnosis not present

## 2022-04-24 DIAGNOSIS — E1165 Type 2 diabetes mellitus with hyperglycemia: Secondary | ICD-10-CM | POA: Diagnosis not present

## 2022-04-24 DIAGNOSIS — I10 Essential (primary) hypertension: Secondary | ICD-10-CM | POA: Diagnosis not present

## 2022-04-24 DIAGNOSIS — E669 Obesity, unspecified: Secondary | ICD-10-CM | POA: Diagnosis not present

## 2022-04-25 LAB — VITAMIN D 25 HYDROXY (VIT D DEFICIENCY, FRACTURES): Vit D, 25-Hydroxy: 38 ng/mL (ref 30–100)

## 2022-04-25 LAB — COMPLETE METABOLIC PANEL WITH GFR
AG Ratio: 1.5 (calc) (ref 1.0–2.5)
ALT: 19 U/L (ref 9–46)
AST: 15 U/L (ref 10–35)
Albumin: 3.9 g/dL (ref 3.6–5.1)
Alkaline phosphatase (APISO): 91 U/L (ref 35–144)
BUN: 17 mg/dL (ref 7–25)
CO2: 25 mmol/L (ref 20–32)
Calcium: 8.8 mg/dL (ref 8.6–10.3)
Chloride: 103 mmol/L (ref 98–110)
Creat: 1.2 mg/dL (ref 0.70–1.28)
Globulin: 2.6 g/dL (calc) (ref 1.9–3.7)
Glucose, Bld: 199 mg/dL — ABNORMAL HIGH (ref 65–99)
Potassium: 4.3 mmol/L (ref 3.5–5.3)
Sodium: 139 mmol/L (ref 135–146)
Total Bilirubin: 0.3 mg/dL (ref 0.2–1.2)
Total Protein: 6.5 g/dL (ref 6.1–8.1)
eGFR: 65 mL/min/{1.73_m2} (ref >=60)

## 2022-04-25 LAB — CBC
HCT: 43.5 % (ref 38.5–50.0)
Hemoglobin: 14.1 g/dL (ref 13.2–17.1)
MCH: 30.1 pg (ref 27.0–33.0)
MCHC: 32.4 g/dL (ref 32.0–36.0)
MCV: 92.8 fL (ref 80.0–100.0)
MPV: 10.7 fL (ref 7.5–12.5)
Platelets: 164 10*3/uL (ref 140–400)
RBC: 4.69 10*6/uL (ref 4.20–5.80)
RDW: 14.6 % (ref 11.0–15.0)
WBC: 4 10*3/uL (ref 3.8–10.8)

## 2022-04-25 LAB — HEMOGLOBIN A1C W/OUT EAG: Hgb A1c MFr Bld: 7.8 % of total Hgb — ABNORMAL HIGH (ref <5.7)

## 2022-04-25 LAB — PSA: PSA: 0.24 ng/mL (ref <=4.00)

## 2022-04-25 LAB — TSH: TSH: 1.67 mIU/L (ref 0.40–4.50)

## 2022-07-01 ENCOUNTER — Ambulatory Visit: Payer: PPO | Admitting: Internal Medicine

## 2022-07-01 ENCOUNTER — Encounter: Payer: Self-pay | Admitting: Internal Medicine

## 2022-07-01 VITALS — BP 110/60 | HR 84 | Ht 64.0 in | Wt 184.0 lb

## 2022-07-01 DIAGNOSIS — E1169 Type 2 diabetes mellitus with other specified complication: Secondary | ICD-10-CM | POA: Diagnosis not present

## 2022-07-01 DIAGNOSIS — Z794 Long term (current) use of insulin: Secondary | ICD-10-CM

## 2022-07-01 DIAGNOSIS — E785 Hyperlipidemia, unspecified: Secondary | ICD-10-CM

## 2022-07-01 LAB — POCT GLYCOSYLATED HEMOGLOBIN (HGB A1C): Hemoglobin A1C: 7.3 % — AB (ref 4.0–5.6)

## 2022-07-01 NOTE — Patient Instructions (Addendum)
Please continue: - Farxiga 10 mg daily in am - Trulicity 3 mg weekly  Change: - R insulin 10-15 units 30 min before dinner  Please return in 3-4 months with your sugar log.

## 2022-07-01 NOTE — Progress Notes (Signed)
Patient ID: Albert Reed, male   DOB: 06/30/1951, 71 y.o.   MRN: [id number]  HPI: Albert Reed is a 71 y.o.-year-old male, returning for follow-up for DM2, dx in 1997, insulin-dependent since 2013, uncontrolled, with complications (DR). Pt. previously saw Dr. Ellison, last visit 7 mo ago.  Reviewed HbA1c: Lab Results  Component Value Date   HGBA1C 7.8 (H) 04/24/2022   HGBA1C 7.8 (A) 11/21/2021   HGBA1C 7.0 (A) 07/18/2021   HGBA1C 8.0 (A) 12/13/2020   HGBA1C 7.5 (A) 09/13/2020   HGBA1C 8.5 (A) 07/05/2020   HGBA1C 7.2 (A) 04/12/2020   HGBA1C 7.4 (A) 01/12/2020   HGBA1C 6.9 (A) 10/27/2019   HGBA1C 8.8 (A) 08/25/2019   Pt is on a regimen of: - Farxiga 10 mg in am - Trulicity 1.5 >> 3 mg weekly - Novolin R insulin  55 >> 60 units in am or at lunch (not taken when sugars are low normal) 25 >> 20 units after dinner  She had nausea and vomiting with higher doses of Trulicity. Prev. On Invokana.  Pt checks his sugars 2-3x a day and they are: - am: 71, 75-125, 166 - 2h after b'fast: n/c - before lunch: 80-90 (after a walk) - 2h after lunch: n/c - before dinner:  - 1h after dinner: 76, 160-247, 314 - bedtime: n/c - nighttime: n/c Lowest sugar was 75; he has hypoglycemia awareness at 70.  Highest sugar was 300.  Glucometer: One Touch Verio flex  - no CKD, last BUN/creatinine:  Lab Results  Component Value Date   BUN 17 04/24/2022   BUN 17 07/05/2020   CREATININE 1.20 04/24/2022   CREATININE 1.17 07/05/2020  On Farxiga.  -+ HL; last set of lipids: Lab Results  Component Value Date   CHOL 144 04/24/2017   HDL 29.40 (L) 04/24/2017   LDLCALC 26 03/30/2014   LDLDIRECT 76.0 04/24/2017   TRIG 330.0 (H) 04/24/2017   CHOLHDL 5 04/24/2017  Not on a statin.  - last eye exam was on 01/23/2022-Dr. 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  Last foot exam 02/2021.   She also has a h/o HTN, diverticulosis.  ROS: + see HPI No increased urination, blurry vision, nausea, chest pain.  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    Employer: [employer]    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  Tobacco Use   Smoking status: Never   Smokeless tobacco: Never  Substance and Sexual Activity   Alcohol use: No    Alcohol/week: 0.0 standard drinks of alcohol   Drug use: No   Sexual activity: Not on file  Other Topics Concern   Not on file  Social   History Narrative   Originally from [place] (in [place] since 1996)   Social Determinants of Health   Financial Resource Strain: Not on [employer] Insecurity: Not on file  Transportation Needs: Not on file  Physical Activity: Not on file  Stress: Not on file  Social Connections: Not on file  Intimate Partner Violence: Not on file   Current Outpatient Medications on File Prior to Visit  Medication Sig Dispense Refill   aspirin 81 MG tablet Take 81 mg by mouth daily.     Blood Glucose Monitoring Suppl (ONETOUCH VERIO FLEX SYSTEM) w/Device KIT 1 each by Does not apply route 2 (two) times daily. E11.9     Continuous Blood Gluc Sensor (FREESTYLE LIBRE 2 SENSOR) MISC 1 Device by Does not apply route  every 14 (fourteen) days. 6 each 3   dapagliflozin propanediol (FARXIGA) 10 MG TABS tablet Take 1 tablet (10 mg total) by mouth daily before breakfast. 90 tablet 3   Dulaglutide (TRULICITY) 1.5 EN/2.7PO SOPN Inject 1.5 mg into the skin once a week. 6 mL 3   insulin regular (NOVOLIN R) 100 units/mL injection 60 units with breakfast, and 20 units with supper. 70 mL 3   Insulin Syringe-Needle U-100 (BD INSULIN SYRINGE ULTRAFINE) 31G X 5/16" 0.5 ML MISC 1 each by Does not apply route daily. 100 each 1   Lancets (ONETOUCH DELICA PLUS [id number]) MISC 1 each by Other route 2 (two) times daily. E11.9 200 each 0   Multiple Vitamins-Minerals (CENTRUM SILVER ULTRA MENS) TABS Take 1 tablet by mouth daily.     ondansetron (ZOFRAN ODT) 4 MG disintegrating tablet Take 1 tablet (4 mg total) by mouth every 8 (eight) hours as needed for nausea or vomiting. 21 tablet 0   ONETOUCH VERIO test strip 1 each by Other route in the morning and at bedtime. And lancets 2/day 200 strip 2   No current facility-administered medications on file prior to visit.   No Known Allergies Family History  Problem Relation Age of Onset   Diabetes Brother        Type 2   Colon cancer Neg Hx        patient dose not know much about family   Esophageal cancer Neg Hx    Stomach cancer Neg Hx    Rectal cancer Neg Hx    PE: BP 110/60 (BP Location: Right Arm, Patient Position: Sitting, Cuff Size: Normal)   Pulse 84   Ht 5' 4" (1.626 m)   Wt 184 lb (83.5 kg)   SpO2 99%   BMI 31.58 kg/m  Wt Readings from Last 3 Encounters:  07/01/22 184 lb (83.5 kg)  11/21/21 180 lb 6.4 oz (81.8 kg)  07/18/21 182 lb 12.8 oz (82.9 kg)   Constitutional: Slightly overweight, in NAD Eyes:  EOMI, no exophthalmos ENT: no neck masses, no cervical lymphadenopathy Cardiovascular: RRR, No MRG Respiratory: CTA B Musculoskeletal: no deformities Skin:no rashes Neurological: no tremor with outstretched hands Diabetic Foot Exam - Simple   Simple Foot  Form Diabetic Foot exam was performed with the following findings: Yes 07/01/2022  3:35 PM  Visual Inspection No deformities, no ulcerations, no other skin breakdown bilaterally: Yes Sensation Testing Intact to touch and monofilament testing bilaterally: Yes Pulse Check Posterior Tibialis and Dorsalis pulse intact bilaterally: Yes Comments    ASSESSMENT: 1. DM2, insulin-dependent, uncontrolled, with complications - DR  2. HL  PLAN:  1. Patient with long-standing, uncontrolled diabetes, on oral antidiabetic regimen with SGLT2 inhibitor, also weekly  GLP-1 receptor agonist and 2x a day regular insulin, with still poor control.  At last visit with Dr. Loanne Drilling, HbA1c was 7.8%, stable.  At today's visit, HbA1c is 7.3% (lower). -At today's visit, sugars are usually at goal in the morning and they are higher than target after dinner.  He checks the blood sugars 1 hour after dinner and then takes 20 units of regular insulin if they are high.  In the morning, he has difficulty getting the 60 units dose in, as he is afraid that he may drop even lower.  He is not taking the insulin when the sugars are low in the morning but sometimes takes it around lunchtime, usually after he eats.  He also mentions that he is trying to have snacks to make sure he is not dropping his sugars.  Therefore, at today's visit, I advised him to stop the a.m. regular insulin and only take 10 to 15 units 30 minutes before dinner.  I did advise him to check blood sugars 2 hours, rather than 1 hour after dinner.   I also advised him to needed to take [place] and Trulicity at the current doses.  These are tolerated well. -I also recommended the CGM.  He has a freestyle libre at home but he was not able to figure out how to attach it.  I advised him to schedule a nurse visit to have it attached.  He does not have a compatible phone, but he can use his receiver.  He agrees with this plan. - I suggested to:  Patient Instructions   Please continue: - Farxiga 10 mg daily in am - Trulicity 3 mg weekly  Change: - R insulin 10-15 units 30 min before dinner  Please return in 3-4 months with your sugar log.   - check sugars at different times of the day - check 4x a day, rotating checks - CBG targets for treatment: 80-130 mg/dL before meals and <180 mg/dL after meals; target HbA1c <7%. - he is up-to-date with eye exams - Return to clinic in 3-4 months, but I did advise him to get in touch with me through MyChart if his blood sugars worsen after the above change  2. HL - Reviewed latest lipid panel from 2018: LDL higher than our goal of less than 70 due to presence of complications, HDL low, triglycerides high: Lab Results  Component Value Date   CHOL 144 04/24/2017   HDL 29.40 (L) 04/24/2017   LDLCALC 26 03/30/2014   LDLDIRECT 76.0 04/24/2017   TRIG 330.0 (H) 04/24/2017   CHOLHDL 5 04/24/2017  -He is not on a statin -We will check a lipid panel now  Philemon Kingdom, MD PhD [facility] Endocrinology

## 2022-07-02 ENCOUNTER — Other Ambulatory Visit: Payer: Self-pay | Admitting: Internal Medicine

## 2022-07-02 DIAGNOSIS — E1169 Type 2 diabetes mellitus with other specified complication: Secondary | ICD-10-CM

## 2022-07-03 ENCOUNTER — Other Ambulatory Visit (INDEPENDENT_AMBULATORY_CARE_PROVIDER_SITE_OTHER): Payer: PPO

## 2022-07-03 DIAGNOSIS — E1169 Type 2 diabetes mellitus with other specified complication: Secondary | ICD-10-CM | POA: Diagnosis not present

## 2022-07-03 DIAGNOSIS — E785 Hyperlipidemia, unspecified: Secondary | ICD-10-CM

## 2022-07-03 LAB — LIPID PANEL
Cholesterol: 88 mg/dL (ref 0–200)
HDL: 29.3 mg/dL — ABNORMAL LOW (ref >39.00)
LDL Cholesterol: 25 mg/dL (ref 0–99)
NonHDL: 59.03
Total CHOL/HDL Ratio: 3
Triglycerides: 169 mg/dL — ABNORMAL HIGH (ref 0.0–149.0)
VLDL: 33.8 mg/dL (ref 0.0–40.0)

## 2022-07-08 ENCOUNTER — Telehealth: Payer: Self-pay

## 2022-07-08 NOTE — Telephone Encounter (Signed)
Pt called to schedule an appt. 

## 2022-07-09 NOTE — Telephone Encounter (Signed)
Patient was called to schedule a follow up appointment, but he states that he needs to schedule an appointment with someone who can help him with his monitor Systems developer Flex System Blood Glucose Monitoring Suppl ([place])) and his sensor ((FREESTYLE LIBRE 2 SENSOR) ) so I transferred the call to Nutrition and Diabetes.

## 2022-07-09 NOTE — Telephone Encounter (Signed)
Pt contacted office to request a referral to see a dietician/nutritionist.

## 2022-07-10 ENCOUNTER — Other Ambulatory Visit: Payer: Self-pay | Admitting: Internal Medicine

## 2022-07-10 DIAGNOSIS — E1169 Type 2 diabetes mellitus with other specified complication: Secondary | ICD-10-CM

## 2022-07-10 DIAGNOSIS — E119 Type 2 diabetes mellitus without complications: Secondary | ICD-10-CM

## 2022-07-10 NOTE — Telephone Encounter (Signed)
Done.  He needs to call [phone] to schedule. C

## 2022-07-10 NOTE — Telephone Encounter (Signed)
Pt advised to contact office for scheduling.

## 2022-07-22 ENCOUNTER — Ambulatory Visit: Payer: PPO | Admitting: Dietician

## 2022-07-23 ENCOUNTER — Encounter: Payer: Self-pay | Admitting: Dietician

## 2022-07-23 ENCOUNTER — Encounter: Payer: PPO | Attending: Internal Medicine | Admitting: Dietician

## 2022-07-23 VITALS — Ht 64.0 in | Wt 185.0 lb

## 2022-07-23 DIAGNOSIS — E118 Type 2 diabetes mellitus with unspecified complications: Secondary | ICD-10-CM | POA: Diagnosis not present

## 2022-07-23 NOTE — Progress Notes (Unsigned)
Medical Nutrition Therapy  Appointment Start time:  1500  Appointment End time:  8850  Primary concerns today: He would like to learn how to use the [employer] and learn more about healthy lifestyle.  He states that he has reduced his sugar consumption and sugary drinks.    Referral diagnosis: Type 2 Diabetes Preferred learning style: no preference indicated Learning readiness: ready  NUTRITION ASSESSMENT  Anthropometrics  64" 185 lbs 07/23/2022   Clinical Medical Hx: Type 2 Diabetes (1997), HTN, HLD Medications: Farxiga, Trulicity 3 mg once per week, Novolin R 60 units in the am and 20 units q HS Labs: A1C 7/3% 11/32/23 decreased 7/8% [id number] Notable Signs/Symptoms: fasting blood glucose 48  Lifestyle & Dietary Hx Patient lives with his wife and family.  She does much of the shopping and cooking.  He works in a group home.  He was previously a Optometrist at [employer].  He is from [place] and has been here since 1996.  Supplements: MCI Sleep: good Stress / self-care: good Current average weekly physical activity: walks 2 days per week for 35 minutes  24-Hr Dietary Recall First Meal: egg, avocado, tomato sandwich on [employer] with PB and J OR cereal (honey nut cheerios) and whole milk Snack: fresh fruit Second Meal: sweet potato, beans, broccoli, baked chicken Snack: fresh fruit Third Meal: rice, broccoli, baked meat OR salad with meat Snack: occasional cookies Beverages: water, tea splenda , regular hot chocolate  NUTRITION DIAGNOSIS  NB-1.1 Food and nutrition-related knowledge deficit As related to balance of carbohydrate, protein, and fat.  As evidenced by diet hx and patient report.Marland Kitchen   NUTRITION INTERVENTION  Nutrition education (E-1) on the following topics:  Training on the [place] 2.  Patient was able to place this on his left upper arm.  He is using the reader.  Instructed him to scan the reader when he wakes, before bed, and at least once during the day as it will  not collect data unless scanned every 8 hours.  Discussed problem solving.  He brought his sensors from home.  He has had them a while and discussed that these are expired and to be used for training purposes Reviewed the physiology of diabetes including insulin resistance. Reviewed nutrition and impact on blood glucose and recommendations to choose a diet that is rich in vegetables, whole grains, fruits, and legumes and other lean protein as desired Reviewed his medication and use Reviewed complications of uncontrolled diabetes Reviewed blood glucose goals and differences with CGM Reviewed treatment of low blood glucose  Handouts Provided Include  How to Thrive:  A Guide for Your Journey with Diabetes by the ADA Meal Plan Card Label reading Diabetes Resources  Learning Style & Readiness for Change Teaching method utilized: Visual & Auditory  Demonstrated degree of understanding via: Teach Back  Barriers to learning/adherence to lifestyle change: none  Goals Established by Pt Consistently use the [employer].  Be sure to scan this at least every 8 hours. Aim to exercise most days for 30 minutes. Healthy diet that consists of vegetables, whole grains, fruits, legumes most of the time.   MONITORING & EVALUATION Dietary intake, weekly physical activity, and label reading prn.  Next Steps  Patient is to call for questions.

## 2022-07-23 NOTE — Patient Instructions (Addendum)
Consistently use the [employer].  Be sure to scan this at least every 8 hours. Aim to exercise most days for 30 minutes. Healthy diet that consists of vegetables, whole grains, fruits, legumes most of the time.  Please call for any questions.

## 2022-07-25 ENCOUNTER — Other Ambulatory Visit: Payer: Self-pay

## 2022-07-25 DIAGNOSIS — Z794 Long term (current) use of insulin: Secondary | ICD-10-CM

## 2022-07-25 MED ORDER — FREESTYLE LIBRE 2 SENSOR MISC: 1.0000 | 1 refills | Status: DC

## 2022-10-02 ENCOUNTER — Ambulatory Visit (INDEPENDENT_AMBULATORY_CARE_PROVIDER_SITE_OTHER): Payer: PPO | Admitting: Internal Medicine

## 2022-10-02 ENCOUNTER — Encounter: Payer: Self-pay | Admitting: Internal Medicine

## 2022-10-02 VITALS — BP 120/70 | HR 75 | Ht 64.0 in | Wt 185.6 lb

## 2022-10-02 DIAGNOSIS — E119 Type 2 diabetes mellitus without complications: Secondary | ICD-10-CM

## 2022-10-02 DIAGNOSIS — Z794 Long term (current) use of insulin: Secondary | ICD-10-CM

## 2022-10-02 DIAGNOSIS — E785 Hyperlipidemia, unspecified: Secondary | ICD-10-CM

## 2022-10-02 DIAGNOSIS — E1169 Type 2 diabetes mellitus with other specified complication: Secondary | ICD-10-CM | POA: Diagnosis not present

## 2022-10-02 LAB — POCT GLYCOSYLATED HEMOGLOBIN (HGB A1C): Hemoglobin A1C: 7.4 % — AB (ref 4.0–5.6)

## 2022-10-02 MED ORDER — ATORVASTATIN CALCIUM 10 MG PO TABS: 10.0000 mg | ORAL_TABLET | Freq: Every day | ORAL | 3 refills | Status: AC

## 2022-10-02 MED ORDER — "INSULIN SYRINGE-NEEDLE U-100 31G X 5/16"" 0.5 ML MISC": 1.0000 | Freq: Every day | 1 refills | Status: AC

## 2022-10-02 MED ORDER — TRULICITY 4.5 MG/0.5ML ~~LOC~~ SOAJ: 4.5000 mg | SUBCUTANEOUS | 3 refills | Status: DC

## 2022-10-02 MED ORDER — INSULIN REGULAR HUMAN 100 UNIT/ML IJ SOLN: INTRAMUSCULAR | 3 refills | Status: DC

## 2022-10-02 NOTE — Progress Notes (Signed)
Patient ID: Albert Reed, male   DOB: Jan 17, 1951, 72 y.o.   MRN: [id number]  HPI: Albert Reed is a 72 y.o.-year-old male, returning for follow-up for DM2, dx in 1997, insulin-dependent since 2013, uncontrolled, with complications (DR). Pt. previously saw Dr. Loanne Drilling, but last visit with me 3 months ago.  Interim history: No increased urination, blurry vision, nausea, chest pain. He was able to attach his CGM since last visit.  Reviewed HbA1c: Lab Results  Component Value Date   HGBA1C 7.3 (A) 07/01/2022   HGBA1C 7.8 (H) 04/24/2022   HGBA1C 7.8 (A) 11/21/2021   HGBA1C 7.0 (A) 07/18/2021   HGBA1C 8.0 (A) 12/13/2020   HGBA1C 7.5 (A) 09/13/2020   HGBA1C 8.5 (A) 07/05/2020   HGBA1C 7.2 (A) 04/12/2020   HGBA1C 7.4 (A) 01/12/2020   HGBA1C 6.9 (A) 10/27/2019   Pt is on a regimen of: - Farxiga 10 mg in am - Trulicity 1.5 >> 3 mg weekly >> will increase to 4.5 mg weekly per PCP advice - Novolin R insulin  55 >> 60 units in am or at lunch (not taken when sugars are low normal) >> 45-60 units - did not stop this as advised at last OV 25 >> 20 units after dinner >> moved 30 minutes before dinner >> 15-20 units before the snack at 11 pm, not before dinner as advised She had nausea and vomiting with higher doses of Trulicity. Prev. On Invokana.  Pt checks his sugars 4x a day and they are:  Prev.: - am: 71, 75-125, 166 - 2h after b'fast: n/c - before lunch: 80-90 (after a walk) - 2h after lunch: n/c - before dinner:  - 1h after dinner: 76, 160-247, 314 - bedtime: n/c - nighttime: n/c Lowest sugar was 75 >> 50s; he has hypoglycemia awareness at 70.  Highest sugar was 300 >> 293.  Glucometer: One Touch Verio flex  - no CKD, last BUN/creatinine:  Lab Results  Component Value Date   BUN 17 04/24/2022   BUN 17 07/05/2020   CREATININE 1.20 04/24/2022   CREATININE 1.17 07/05/2020  On Farxiga.  -+ HL; last set of lipids: Lab Results  Component Value Date   CHOL 88 07/03/2022    HDL 29.30 (L) 07/03/2022   LDLCALC 25 07/03/2022   LDLDIRECT 76.0 04/24/2017   TRIG 169.0 (H) 07/03/2022   CHOLHDL 3 07/03/2022  On Lipitor 10 mg daily.  - last eye exam was on 01/23/2022-Dr. 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  Last foot exam 06/2022.  She also has a h/o HTN, diverticulosis.  ROS: + see HPI  Past Medical History:  Diagnosis Date   COLONIC POLYPS, HX OF 04/01/2007   DIVERTICULOSIS, COLON 04/01/2007   DM 11/24/2008   HYPERCHOLESTEROLEMIA 07/28/2008   HYPERTENSION 04/01/2007   HYPERTHYROIDISM 11/09/2009   LEUKOPENIA, CHRONIC 07/28/2008   Past Surgical History:  Procedure Laterality Date   BUNIONECTOMY     Social History   Socioeconomic History   Marital status: Married    Spouse name: Not on file   Number of children: Not on file   Years of education: Not on file   Highest education level: Not on file  Occupational History   Occupation: [employer]  Employer: [employer]    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  Tobacco Use   Smoking status: Never   Smokeless tobacco: Never  Substance and Sexual Activity   Alcohol use: No    Alcohol/week: 0.0 standard drinks of alcohol   Drug use: No   Sexual activity: Not on file  Other Topics Concern   Not on file  Social History Narrative   Originally from [place] (in [place] since 1996)   Social Determinants of Health   Financial Resource Strain: Not on file  Food Insecurity: Not on file  Transportation Needs: Not on file  Physical Activity: Not on file  Stress: Not on file  Social Connections: Not on file  Intimate Partner Violence: Not on file   Current Outpatient Medications on File Prior to Visit   Medication Sig Dispense Refill   aspirin 81 MG tablet Take 81 mg by mouth daily.     Blood Glucose Monitoring Suppl (ONETOUCH VERIO FLEX SYSTEM) w/Device KIT 1 each by Does not apply route 2 (two) times daily. E11.9     Continuous Blood Gluc Sensor (FREESTYLE LIBRE 2 SENSOR) MISC 1 Device by Does not apply route every 14 (fourteen) days. 6 each 1   dapagliflozin propanediol (FARXIGA) 10 MG TABS tablet Take 1 tablet (10 mg total) by mouth daily before breakfast. 90 tablet 3   Dulaglutide (TRULICITY) 1.5 0000000 SOPN Inject 1.5 mg into the skin once a week. 6 mL 3   insulin regular (NOVOLIN R) 100 units/mL injection 60 units with breakfast, and 20 units with supper. 70 mL 3   Insulin Syringe-Needle U-100 (BD INSULIN SYRINGE ULTRAFINE) 31G X 5/16" 0.5 ML MISC 1 each by Does not apply route daily. 100 each 1   Lancets (ONETOUCH DELICA PLUS 123XX123) MISC 1 each by Other route 2 (two) times daily. E11.9 200 each 0   Multiple Vitamins-Minerals (CENTRUM SILVER ULTRA MENS) TABS Take 1 tablet by mouth daily.     ondansetron (ZOFRAN ODT) 4 MG disintegrating tablet Take 1 tablet (4 mg total) by mouth every 8 (eight) hours as needed for nausea or vomiting. 21 tablet 0   ONETOUCH VERIO test strip 1 each by Other route in the morning and at bedtime. And lancets 2/day 200 strip 2   No current facility-administered medications on file prior to visit.   No Known Allergies Family History  Problem Relation Age of Onset   Diabetes Brother        Type 2   Colon cancer Neg Hx        patient dose not know much about family   Esophageal cancer Neg Hx    Stomach cancer Neg Hx    Rectal cancer Neg Hx    PE: There were no vitals taken for this visit. Wt Readings from Last 3 Encounters:  07/23/22 185 lb (83.9 kg)  07/01/22 184 lb (83.5 kg)  11/21/21 180 lb 6.4 oz (81.8 kg)   Constitutional: Slightly overweight, in NAD Eyes:  EOMI, no exophthalmos ENT: no neck masses, no cervical  lymphadenopathy Cardiovascular: RRR, No MRG Respiratory: CTA B Musculoskeletal: no deformities Skin:no rashes Neurological: no tremor with outstretched hands  ASSESSMENT: 1. DM2, insulin-dependent, uncontrolled, with complications - DR  2. HL  PLAN:  1. Patient with longstanding, uncontrolled, type 2 diabetes, on oral antidiabetic regimen with SGLT2 inhibitor, and also injectable regimen with weekly GLP-1 receptor agonist and regular insulin before dinner.  At last visit, HbA1c was lower, at 7.3%.  Sugars were at goal in the  morning and they were higher than target after dinner.  She was not taking the regular insulin correctly at last visit, checking sugars 1 hour after dinner and then taking 20 units of insulin if the sugars are high.  At last visit we stopped his morning insulin and I advised him to reduce the regular insulin before dinner and take it 30 minutes before this meal.  We continued to rest of the regimen. -He had a CGM at home and he was able to attach it since last visit CGM interpretation: -At today's visit, we reviewed his CGM downloads: It appears that 70 to percent 72% of values are in target range (goal >70%), while 24% are higher than 180 (goal <25%), and 4% are lower than 70 (goal <4%).  The calculated average blood sugar is 150.  The projected HbA1c for the next 3 months ([facility]) is 6.9%. -Reviewing the CGM trends, sugars appear to be increasing after dinner but upon questioning, he did not follow the advice of moving his regular insulin from after dinner to before dinner.  He is having a snack around 11 PM and he is taking the regular insulin at that time.  As a consequence, his sugars are dropping abruptly overnight, frequently to the 50s.  He has a rebound in blood sugars afterwards, with a nadir around 11 am.  Sugars have been dropping abruptly again and upon questioning, he did not follow the advised to stop his morning regular insulin. -At today's visit, we reviewed his  tracings together and I pointed out the blood sugar fluctuations.  I again strongly advised him to move the regular insulin 30 minutes before dinner and for now we will try not to give him any insulin before snack at night.  I advised him to try to eliminate this, however, if not, to try to stick with peanuts, rather than crackers.  I also advised him to reduce his regular insulin before breakfast, as I do not expect that he needs this much without the low blood sugar in the second half of the night. -He tells me that his primary care doctor advised him to increase Trulicity to 4.5 mg weekly.  I agree with this change. - I suggested to:  Patient Instructions  Please continue: - Farxiga 10 mg daily in am  OK to increase: - Trulicity 4.5 mg weekly  Change: - R insulin 30 units 30 min before b'fast - R insulin 8-12 units 30 min before dinner  Please return in 3-4 months.  - we checked his HbA1c: 7.4% (slightly higher) - advised to check sugars at different times of the day - 4x a day, rotating check times - advised for yearly eye exams >> he is UTD - return to clinic in 3-4 months  2. HL -Reviewed the latest lipid panel from 06/2022: LDL at goal, quite low, triglycerides slightly high, HDL low: Lab Results  Component Value Date   CHOL 88 07/03/2022   HDL 29.30 (L) 07/03/2022   LDLCALC 25 07/03/2022   LDLDIRECT 76.0 04/24/2017   TRIG 169.0 (H) 07/03/2022   CHOLHDL 3 07/03/2022  -He is on Lipitor 10 mg daily >> tolerated well  Philemon Kingdom, MD PhD [facility]

## 2022-10-02 NOTE — Patient Instructions (Addendum)
Please continue: - Farxiga 10 mg daily in am  OK to increase: - Trulicity 4.5 mg weekly  Change: - R insulin 30 units 30 min before b'fast - R insulin 8-12 units 30 min before dinner  Please return in 3-4 months.

## 2022-11-24 ENCOUNTER — Encounter (HOSPITAL_COMMUNITY): Payer: Self-pay

## 2022-11-24 ENCOUNTER — Ambulatory Visit (HOSPITAL_COMMUNITY)
Admission: EM | Admit: 2022-11-24 | Discharge: 2022-11-24 | Disposition: A | Discharge status: Home / Self Care | Payer: PPO | Attending: Family Medicine | Admitting: Family Medicine

## 2022-11-24 DIAGNOSIS — J019 Acute sinusitis, unspecified: Secondary | ICD-10-CM

## 2022-11-24 MED ORDER — BENZONATATE 100 MG PO CAPS: 100.0000 mg | ORAL_CAPSULE | Freq: Three times a day (TID) | ORAL | 0 refills | Status: DC | PRN

## 2022-11-24 MED ORDER — CEFDINIR 300 MG PO CAPS: 600.0000 mg | ORAL_CAPSULE | Freq: Every day | ORAL | 0 refills | Status: AC

## 2022-11-24 NOTE — ED Provider Notes (Signed)
[facility]    CSN: [id number] Arrival date & time: 11/24/22  1007      History   Chief Complaint Chief Complaint  Patient presents with   Cough    HPI Albert Reed is a 72 y.o. male.    Cough  Here for cough and nasal congestion and postnasal drainage.  He has had a little right-sided headache when he bends over.  No fever or chills and no nausea or vomiting.  In the last couple of days the cough is worsened.  He is not having any shortness of breath nor is he having any malaise or myalgia.  He has a history of diabetes, and his last A1c was 7.4.  He states that sugars have been good lately  Past Medical History:  Diagnosis Date   COLONIC POLYPS, HX OF 04/01/2007   DIVERTICULOSIS, COLON 04/01/2007   DM 11/24/2008   HYPERCHOLESTEROLEMIA 07/28/2008   HYPERTENSION 04/01/2007   HYPERTHYROIDISM 11/09/2009   LEUKOPENIA, CHRONIC 07/28/2008    Patient Active Problem List   Diagnosis Date Noted   Pelvic pain in male 09/26/2017   Diabetes 12/09/2015   Pain of finger of right hand 10/27/2013   Encounter for long-term (current) use of other medications 04/23/2012   Screening for prostate cancer 04/23/2012   Routine general medical examination at a health care facility 02/09/2011   LEG PAIN, RIGHT 08/09/2010   Thyrotoxicosis 11/09/2009   HYPERCHOLESTEROLEMIA 07/28/2008   Leukocytopenia 07/28/2008   Essential hypertension 04/01/2007   DIVERTICULOSIS, COLON 04/01/2007   COLONIC POLYPS, HX OF 04/01/2007    Past Surgical History:  Procedure Laterality Date   BUNIONECTOMY         Home Medications    Prior to Admission medications   Medication Sig Start Date End Date Taking? Authorizing Provider  aspirin 81 MG tablet Take 81 mg by mouth daily.   Yes [provider]  atorvastatin (LIPITOR) 10 MG tablet Take 1 tablet (10 mg total) by mouth daily. 10/02/22  Yes Carlus Pavlov, MD  benzonatate (TESSALON) 100 MG capsule Take 1 capsule (100 mg total) by  mouth 3 (three) times daily as needed for cough. 11/24/22  Yes Zenia Resides, MD  Blood Glucose Monitoring Suppl (ONETOUCH VERIO FLEX SYSTEM) w/Device KIT 1 each by Does not apply route 2 (two) times daily. E11.9   Yes [provider]  cefdinir (OMNICEF) 300 MG capsule Take 2 capsules (600 mg total) by mouth daily for 7 days. 11/24/22 12/01/22 Yes Julianah Marciel, Janace Aris, MD  Continuous Blood Gluc Sensor (FREESTYLE LIBRE 2 SENSOR) MISC 1 Device by Does not apply route every 14 (fourteen) days. 07/25/22  Yes Carlus Pavlov, MD  dapagliflozin propanediol (FARXIGA) 10 MG TABS tablet Take 1 tablet (10 mg total) by mouth daily before breakfast. 10/03/21  Yes Romero Belling, MD  dorzolamide-timolol (COSOPT) 2-0.5 % ophthalmic solution SMARTSIG:In Eye(s) 09/26/22  Yes [provider]  Dulaglutide (TRULICITY) 4.5 MG/0.5ML SOPN Inject 4.5 mg as directed once a week. 10/02/22  Yes Carlus Pavlov, MD  insulin regular (NOVOLIN R) 100 units/mL injection Inject under skin 30 units with breakfast, and 8-12 units with supper. 10/02/22  Yes Carlus Pavlov, MD  Insulin Syringe-Needle U-100 (BD INSULIN SYRINGE ULTRAFINE) 31G X 5/16" 0.5 ML MISC 1 each by Does not apply route daily. 10/02/22  Yes Carlus Pavlov, MD  Lancets [facility] DELICA PLUS [place]) MISC 1 each by Other route 2 (two) times daily. E11.9 04/11/20  Yes Romero Belling, MD  Multiple Vitamins-Minerals (CENTRUM  SILVER ULTRA MENS) TABS Take 1 tablet by mouth daily.   Yes [provider]  [facility] VERIO test strip 1 each by Other route in the morning and at bedtime. And lancets 2/day 04/03/21  Yes Romero Belling, MD  latanoprost (XALATAN) 0.005 % ophthalmic solution SMARTSIG:1 Drop(s) In Eye(s) Every Evening 08/22/22   [provider]  ondansetron (ZOFRAN ODT) 4 MG disintegrating tablet Take 1 tablet (4 mg total) by mouth every 8 (eight) hours as needed for nausea or vomiting. 04/21/20   Particia Nearing, PA-C    Family  History Family History  Problem Relation Age of Onset   Diabetes Brother        Type 2   Colon cancer Neg Hx        patient dose not know much about family   Esophageal cancer Neg Hx    Stomach cancer Neg Hx    Rectal cancer Neg Hx     Social History Social History   Tobacco Use   Smoking status: Never   Smokeless tobacco: Never  Substance Use Topics   Alcohol use: No    Alcohol/week: 0.0 standard drinks of alcohol   Drug use: No     Allergies   Patient has no known allergies.   Review of Systems Review of Systems  Respiratory:  Positive for cough.      Physical Exam Triage Vital Signs ED Triage Vitals  Enc Vitals Group     BP 11/24/22 1042 (!) 143/76     Pulse Rate 11/24/22 1042 95     Resp 11/24/22 1042 16     Temp 11/24/22 1042 98.1 F (36.7 C)     Temp Source 11/24/22 1042 Oral     SpO2 11/24/22 1042 96 %     Weight --      Height --      Head Circumference --      Peak Flow --      Pain Score 11/24/22 1040 0     Pain Loc --      Pain Edu? --      Excl. in GC? --    No data found.  Updated Vital Signs BP (!) 143/76 (BP Location: Left Arm)   Pulse 95   Temp 98.1 F (36.7 C) (Oral)   Resp 16   SpO2 96%   Visual Acuity Right Eye Distance:   Left Eye Distance:   Bilateral Distance:    Right Eye Near:   Left Eye Near:    Bilateral Near:     Physical Exam Vitals reviewed.  Constitutional:      General: He is not in acute distress.    Appearance: He is not ill-appearing, toxic-appearing or diaphoretic.  HENT:     Right Ear: Tympanic membrane and ear canal normal.     Left Ear: Tympanic membrane and ear canal normal.     Nose: Congestion present.     Mouth/Throat:     Mouth: Mucous membranes are moist.     Comments: There is clear mucus draining.  No erythema Eyes:     Extraocular Movements: Extraocular movements intact.     Conjunctiva/sclera: Conjunctivae normal.     Pupils: Pupils are equal, round, and reactive to light.   Cardiovascular:     Rate and Rhythm: Normal rate and regular rhythm.     Heart sounds: No murmur heard. Pulmonary:     Effort: Pulmonary effort is normal. No respiratory distress.     Breath  sounds: No stridor. No wheezing, rhonchi or rales.  Musculoskeletal:     Cervical back: Neck supple.  Lymphadenopathy:     Cervical: No cervical adenopathy.  Skin:    Capillary Refill: Capillary refill takes less than 2 seconds.     Coloration: Skin is not jaundiced or pale.  Neurological:     General: No focal deficit present.     Mental Status: He is alert and oriented to person, place, and time.  Psychiatric:        Behavior: Behavior normal.      UC Treatments / Results  Labs (all labs ordered are listed, but only abnormal results are displayed) Labs Reviewed - No data to display  EKG   Radiology No results found.  Procedures Procedures (including critical care time)  Medications Ordered in UC Medications - No data to display  Initial Impression / Assessment and Plan / UC Course  I have reviewed the triage vital signs and the nursing notes.  Pertinent labs & imaging results that were available during my care of the patient were reviewed by me and considered in my medical decision making (see chart for details).        Vital signs are reassuring, and I do not think he has any sign or symptom of pneumonia.  No viral swab testing is done, as he is on day 7 or 8 of his symptoms.  Number to treat for acute sinusitis, with Omnicef and Tessalon Perles for cough.  I did discuss with him that this most likely originated as a viral illness, but since he has been sick for 8 days or more, I am going to treat for acute sinusitis. Final Clinical Impressions(s) / UC Diagnoses   Final diagnoses:  Acute sinusitis, recurrence not specified, unspecified location     Discharge Instructions      Take cefdinir 300 mg--2 capsules together daily for 7 days  Take benzonatate 100 mg,  1 tab every 8 hours as needed for cough.       ED Prescriptions     Medication Sig Dispense Auth. Provider   cefdinir (OMNICEF) 300 MG capsule Take 2 capsules (600 mg total) by mouth daily for 7 days. 14 capsule Jaeline Whobrey, Janace ArisPamela K, MD   benzonatate (TESSALON) 100 MG capsule Take 1 capsule (100 mg total) by mouth 3 (three) times daily as needed for cough. 21 capsule Zenia ResidesBanister, Hellon Vaccarella K, MD      PDMP not reviewed this encounter.   Zenia ResidesBanister, [place]atham Howington K, MD 11/24/22 [phone]

## 2022-11-24 NOTE — ED Triage Notes (Addendum)
Pt is here for cough , nasal congestion, right side of face pain  x 1wk . Pt has been using cough drops, pt stated he has beenunable to sleep at night x 2days .

## 2022-11-24 NOTE — Discharge Instructions (Addendum)
Take cefdinir 300 mg--2 capsules together daily for 7 days  Take benzonatate 100 mg, 1 tab every 8 hours as needed for cough.   

## 2022-12-05 ENCOUNTER — Other Ambulatory Visit: Payer: Self-pay | Admitting: Internal Medicine

## 2022-12-05 DIAGNOSIS — E119 Type 2 diabetes mellitus without complications: Secondary | ICD-10-CM

## 2022-12-10 ENCOUNTER — Encounter (HOSPITAL_COMMUNITY): Payer: Self-pay | Admitting: Emergency Medicine

## 2022-12-10 ENCOUNTER — Ambulatory Visit (HOSPITAL_COMMUNITY)
Admission: EM | Admit: 2022-12-10 | Discharge: 2022-12-10 | Disposition: A | Discharge status: Home / Self Care | Payer: PPO | Attending: Family Medicine | Admitting: Family Medicine

## 2022-12-10 DIAGNOSIS — Z7984 Long term (current) use of oral hypoglycemic drugs: Secondary | ICD-10-CM | POA: Insufficient documentation

## 2022-12-10 DIAGNOSIS — E119 Type 2 diabetes mellitus without complications: Secondary | ICD-10-CM | POA: Insufficient documentation

## 2022-12-10 DIAGNOSIS — R197 Diarrhea, unspecified: Secondary | ICD-10-CM | POA: Insufficient documentation

## 2022-12-10 DIAGNOSIS — R102 Pelvic and perineal pain: Secondary | ICD-10-CM | POA: Diagnosis present

## 2022-12-10 DIAGNOSIS — Z794 Long term (current) use of insulin: Secondary | ICD-10-CM | POA: Insufficient documentation

## 2022-12-10 DIAGNOSIS — R1110 Vomiting, unspecified: Secondary | ICD-10-CM | POA: Insufficient documentation

## 2022-12-10 DIAGNOSIS — Z8639 Personal history of other endocrine, nutritional and metabolic disease: Secondary | ICD-10-CM | POA: Diagnosis not present

## 2022-12-10 LAB — COMPREHENSIVE METABOLIC PANEL
ALT: 24 U/L (ref 0–44)
AST: 21 U/L (ref 15–41)
Albumin: 4.4 g/dL (ref 3.5–5.0)
Alkaline Phosphatase: 102 U/L (ref 38–126)
Anion gap: 14 (ref 5–15)
BUN: 23 mg/dL (ref 8–23)
CO2: 21 mmol/L — ABNORMAL LOW (ref 22–32)
Calcium: 9.3 mg/dL (ref 8.9–10.3)
Chloride: 99 mmol/L (ref 98–111)
Creatinine, Ser: 1.5 mg/dL — ABNORMAL HIGH (ref 0.61–1.24)
GFR, Estimated: 49 mL/min — ABNORMAL LOW (ref >60)
Glucose, Bld: 248 mg/dL — ABNORMAL HIGH (ref 70–99)
Potassium: 3.5 mmol/L (ref 3.5–5.1)
Sodium: 134 mmol/L — ABNORMAL LOW (ref 135–145)
Total Bilirubin: 0.7 mg/dL (ref 0.3–1.2)
Total Protein: 8.2 g/dL — ABNORMAL HIGH (ref 6.5–8.1)

## 2022-12-10 LAB — CBC WITH DIFFERENTIAL/PLATELET
Abs Immature Granulocytes: 0.02 10*3/uL (ref 0.00–0.07)
Basophils Absolute: 0 10*3/uL (ref 0.0–0.1)
Basophils Relative: 0 %
Eosinophils Absolute: 0 10*3/uL (ref 0.0–0.5)
Eosinophils Relative: 1 %
HCT: 46.1 % (ref 39.0–52.0)
Hemoglobin: 16.1 g/dL (ref 13.0–17.0)
Immature Granulocytes: 0 %
Lymphocytes Relative: 25 %
Lymphs Abs: 1.5 10*3/uL (ref 0.7–4.0)
MCH: 30 pg (ref 26.0–34.0)
MCHC: 34.9 g/dL (ref 30.0–36.0)
MCV: 85.8 fL (ref 80.0–100.0)
Monocytes Absolute: 0.5 10*3/uL (ref 0.1–1.0)
Monocytes Relative: 8 %
Neutro Abs: 4.1 10*3/uL (ref 1.7–7.7)
Neutrophils Relative %: 66 %
Platelets: 233 10*3/uL (ref 150–400)
RBC: 5.37 MIL/uL (ref 4.22–5.81)
RDW: 14.5 % (ref 11.5–15.5)
WBC: 6.1 10*3/uL (ref 4.0–10.5)
nRBC: 0 % (ref 0.0–0.2)

## 2022-12-10 MED ORDER — ONDANSETRON HCL 4 MG PO TABS: 4.0000 mg | ORAL_TABLET | Freq: Four times a day (QID) | ORAL | 0 refills | Status: DC

## 2022-12-10 NOTE — Discharge Instructions (Addendum)
All your blood work is normal. We will call you when the stool test is back In the mean time sip on bone broth soup and follow the types of food to eat for diarrhea on my instructions.  You may take  pepto or Imodium after you have collected the stool sample to be tested.  Get electrolyte fluids and drink at least 16 oz per  day while having diarrhea.  I have sent medication for nausea in case the vomiting comes back You may get back on your blood pressure medication once your blood pressure is more than 140/90 and your diabetic medicaiton once you are eating well. Do not let your glucose get more than 250 fasting.

## 2022-12-10 NOTE — ED Provider Notes (Addendum)
[facility]    CSN: [id number] Arrival date & time: 12/10/22  4098      History   Chief Complaint Chief Complaint  Patient presents with   Diarrhea   Emesis    HPI Albert Reed is a 72 y.o. male who presents with onset of lower abdominal pain and watery stools that started 2 mornings ago. Has lost count of how many BM's he has had each day, but has had # 6 so far all night. He had eaten out the day before, and are fresh make food with some type of meat at home. Denies being out of the country in the past month, but has been on [employer] for sinusitis 04/07 which he finished 5 days ago. He denies fever. Has not had any more abdominal pain. Denies blood in the stool. Has been staying on BRAT diet, but has diarrhea right after he eats anything. Has not taken his meds today. His glucose this am was 216 He took one does of imodium yesterday only      Past Medical History:  Diagnosis Date   COLONIC POLYPS, HX OF 04/01/2007   DIVERTICULOSIS, COLON 04/01/2007   DM 11/24/2008   HYPERCHOLESTEROLEMIA 07/28/2008   HYPERTENSION 04/01/2007   HYPERTHYROIDISM 11/09/2009   LEUKOPENIA, CHRONIC 07/28/2008    Patient Active Problem List   Diagnosis Date Noted   Pelvic pain in male 09/26/2017   Diabetes 12/09/2015   Pain of finger of right hand 10/27/2013   Encounter for long-term (current) use of other medications 04/23/2012   Screening for prostate cancer 04/23/2012   Routine general medical examination at a health care facility 02/09/2011   LEG PAIN, RIGHT 08/09/2010   Thyrotoxicosis 11/09/2009   HYPERCHOLESTEROLEMIA 07/28/2008   Leukocytopenia 07/28/2008   Essential hypertension 04/01/2007   DIVERTICULOSIS, COLON 04/01/2007   COLONIC POLYPS, HX OF 04/01/2007    Past Surgical History:  Procedure Laterality Date   BUNIONECTOMY         Home Medications    Prior to Admission medications   Medication Sig Start Date End Date Taking? Authorizing Provider  ondansetron  (ZOFRAN) 4 MG tablet Take 1 tablet (4 mg total) by mouth every 6 (six) hours. 12/10/22  Yes Rodriguez-Southworth, Nettie Elm, PA-C  aspirin 81 MG tablet Take 81 mg by mouth daily.    [provider]  atorvastatin (LIPITOR) 10 MG tablet Take 1 tablet (10 mg total) by mouth daily. 10/02/22   Carlus Pavlov, MD  Blood Glucose Monitoring Suppl (ONETOUCH VERIO FLEX SYSTEM) w/Device KIT 1 each by Does not apply route 2 (two) times daily. E11.9    [provider]  Continuous Glucose Sensor (FREESTYLE LIBRE 2 SENSOR) MISC 1 Device by Does not apply route every 14 (fourteen) days. 12/05/22   Carlus Pavlov, MD  dapagliflozin propanediol (FARXIGA) 10 MG TABS tablet Take 1 tablet (10 mg total) by mouth daily before breakfast. 10/03/21   Romero Belling, MD  dorzolamide-timolol (COSOPT) 2-0.5 % ophthalmic solution SMARTSIG:In Eye(s) 09/26/22   [provider]  Dulaglutide (TRULICITY) 4.5 MG/0.5ML SOPN Inject 4.5 mg as directed once a week. 10/02/22   Carlus Pavlov, MD  insulin regular (NOVOLIN R) 100 units/mL injection Inject under skin 30 units with breakfast, and 8-12 units with supper. 10/02/22   Carlus Pavlov, MD  Insulin Syringe-Needle U-100 (BD INSULIN SYRINGE ULTRAFINE) 31G X 5/16" 0.5 ML MISC 1 each by Does not apply route daily. 10/02/22   Carlus Pavlov, MD  Lancets [facility] DELICA PLUS [place]) MISC 1  each by Other route 2 (two) times daily. E11.9 04/11/20   Romero Belling, MD  latanoprost (XALATAN) 0.005 % ophthalmic solution SMARTSIG:1 Drop(s) In Eye(s) Every Evening 08/22/22   [provider]  Multiple Vitamins-Minerals (CENTRUM SILVER ULTRA MENS) TABS Take 1 tablet by mouth daily.    [provider]  ondansetron (ZOFRAN ODT) 4 MG disintegrating tablet Take 1 tablet (4 mg total) by mouth every 8 (eight) hours as needed for nausea or vomiting. 04/21/20   Particia Nearing, PA-C  [facility] VERIO test strip 1 each by Other route in the morning and at  bedtime. And lancets 2/day 04/03/21   Romero Belling, MD    Family History Family History  Problem Relation Age of Onset   Diabetes Brother        Type 2   Colon cancer Neg Hx        patient dose not know much about family   Esophageal cancer Neg Hx    Stomach cancer Neg Hx    Rectal cancer Neg Hx     Social History Social History   Tobacco Use   Smoking status: Never   Smokeless tobacco: Never  Substance Use Topics   Alcohol use: No    Alcohol/week: 0.0 standard drinks of alcohol   Drug use: No     Allergies   Patient has no known allergies.   Review of Systems Review of Systems As noted  Physical Exam Triage Vital Signs ED Triage Vitals  Enc Vitals Group     BP 12/10/22 0950 107/65     Pulse Rate 12/10/22 0950 92     Resp 12/10/22 0950 17     Temp 12/10/22 0950 97.7 F (36.5 C)     Temp Source 12/10/22 0950 Oral     SpO2 12/10/22 0950 97 %     Weight --      Height --      Head Circumference --      Peak Flow --      Pain Score 12/10/22 0949 0     Pain Loc --      Pain Edu? --      Excl. in GC? --    Orthostatic VS for the past 24 hrs:  BP- Lying Pulse- Lying BP- Sitting Pulse- Sitting BP- Standing at 0 minutes Pulse- Standing at 0 minutes  12/10/22 1037 112/72 91 110/69 89 107/64 98    Updated Vital Signs BP 107/65 (BP Location: Left Arm)   Pulse 92   Temp 97.7 F (36.5 C) (Oral)   Resp 17   SpO2 97%   Visual Acuity Right Eye Distance:   Left Eye Distance:   Bilateral Distance:    Right Eye Near:   Left Eye Near:    Bilateral Near:     Physical Exam Vitals and nursing note reviewed.  Constitutional:      General: He is not in acute distress.    Appearance: He is normal weight. He is not toxic-appearing.  HENT:     Right Ear: External ear normal.     Left Ear: External ear normal.  Eyes:     General: No scleral icterus.    Conjunctiva/sclera: Conjunctivae normal.  Pulmonary:     Effort: Pulmonary effort is normal.  Abdominal:      General: Bowel sounds are normal. There is distension.     Palpations: Abdomen is soft. There is no mass.     Tenderness: There is no abdominal tenderness. There  is no guarding or rebound.  Musculoskeletal:        General: Normal range of motion.     Cervical back: Neck supple.  Skin:    General: Skin is warm and dry.  Neurological:     Mental Status: He is alert and oriented to person, place, and time.     Gait: Gait normal.  Psychiatric:        Mood and Affect: Mood normal.        Behavior: Behavior normal.        Thought Content: Thought content normal.        Judgment: Judgment normal.      UC Treatments / Results  Labs (all labs ordered are listed, but only abnormal results are displayed) Labs Reviewed  GASTROINTESTINAL PANEL BY PCR, STOOL (REPLACES STOOL CULTURE)  CBC WITH DIFFERENTIAL/PLATELET  COMPREHENSIVE METABOLIC PANEL  CBC and CMP are normal   EKG   Radiology No results found.  Procedures Procedures (including critical care time)  Medications Ordered in UC Medications - No data to display  Initial Impression / Assessment and Plan / UC Course  I have reviewed the triage vital signs and the nursing notes. He was able to drink 20 oz of fluids here and did not vomit and did not have any diarrhea. He collected stool for GI panel and we will inform him if results come positive.   Acute Diarrhea  Advised to follow BRAT diet. See instructions.     Final Clinical Impressions(s) / UC Diagnoses   Final diagnoses:  Diarrhea, unspecified type  History of insulin dependent diabetes mellitus     Discharge Instructions      All your blood work is normal. We will call you when the stool test is back In the mean time sip on bone broth soup and follow the types of food to eat for diarrhea on my instructions.  You may take  pepto or Imodium after you have collected the stool sample to be tested.  Get electrolyte fluids and drink at least 16 oz per  day  while having diarrhea.  I have sent medication for nausea in case the vomiting comes back You may get back on your blood pressure medication once your blood pressure is more than 140/90 and your diabetic medicaiton once you are eating well. Do not let your glucose get more than 250 fasting.       ED Prescriptions     Medication Sig Dispense Auth. Provider   ondansetron (ZOFRAN) 4 MG tablet Take 1 tablet (4 mg total) by mouth every 6 (six) hours. 12 tablet Rodriguez-Southworth, Nettie Elm, PA-C      PDMP not reviewed this encounter.   Garey Ham, PA-C 12/10/22 1306    Rodriguez-Southworth, [place], PA-C 12/10/22 1319

## 2022-12-10 NOTE — ED Triage Notes (Signed)
Since Sunday having lower abd cramping, watery stools. Reports last night started vomiting. Took Pepto Bismol.

## 2022-12-11 LAB — GASTROINTESTINAL PANEL BY PCR, STOOL (REPLACES STOOL CULTURE)

## 2022-12-12 ENCOUNTER — Ambulatory Visit (HOSPITAL_COMMUNITY)
Admission: EM | Admit: 2022-12-12 | Discharge: 2022-12-12 | Disposition: A | Discharge status: Home / Self Care | Payer: PPO | Attending: Sports Medicine | Admitting: Sports Medicine

## 2022-12-12 ENCOUNTER — Encounter (HOSPITAL_COMMUNITY): Payer: Self-pay | Admitting: *Deleted

## 2022-12-12 DIAGNOSIS — R197 Diarrhea, unspecified: Secondary | ICD-10-CM | POA: Diagnosis not present

## 2022-12-12 DIAGNOSIS — R55 Syncope and collapse: Secondary | ICD-10-CM | POA: Diagnosis not present

## 2022-12-12 MED ORDER — SIMETHICONE 80 MG PO TABS: 1.0000 | ORAL_TABLET | Freq: Three times a day (TID) | ORAL | 0 refills | Status: AC | PRN

## 2022-12-12 NOTE — Discharge Instructions (Addendum)
Your vital signs and EKG today were normal.  I recommend you continue with your adequate hydration with water and 0 sugar Gatorade and small bland meals.  You may also try some Gas-X, simethicone up to 3 times a day as needed for your belching and fullness of your abdomen.  I would like for you to see your primary care provider next week so they can follow-up on your slightly elevated kidney number.  This is likely secondary to some dehydration.  Keep close tabs on your blood sugars this week if you continue to have elevated blood glucose over 250 but you cannot bring down with your insulin present to the urgent care.

## 2022-12-12 NOTE — ED Provider Notes (Signed)
[facility]    CSN: [id number] Arrival date & time: 12/12/22  4098      History   Chief Complaint Chief Complaint  Patient presents with   Cough   Near Syncope    HPI Albert Reed is a 72 y.o. male.   He is here today with his wife with chief complaint of continued diarrhea intermittent constipation, hiccups decreased appetite and presyncope.  Of note he was seen here in urgent care a couple days ago complaint of diarrhea.  Stool studies were negative for any organisms.  His lab at that time did show slight bump in his creatinine and mild hyponatremia.  He reports he has been able to keep down water and Gatorade and very light meals such as toast however he has a very small appetite.  Overnight he started having some hiccups and cough that made him feel like he was going to vomit so he quickly got out of bed and went to the restroom however that left him feeling lightheaded and caused him to fall.  He was able to get up and get to the restroom where he had another episode of diarrhea.  His wife called EMS who came and evaluated him.  She reports he had a normal blood glucose level last than 140 and his other vitals were stable.  They did not take him to the hospital but encouraged him to be seen at the urgent care today.  He denies any loss of consciousness, hitting his head, chest pain or shortness of breath.  He states that he has had some intermittent constipation and diarrhea however he is not eating very large meals for the past couple days.  He reports he is up-to-date on his screening exams.     Past Medical History:  Diagnosis Date   COLONIC POLYPS, HX OF 04/01/2007   DIVERTICULOSIS, COLON 04/01/2007   DM 11/24/2008   HYPERCHOLESTEROLEMIA 07/28/2008   HYPERTENSION 04/01/2007   HYPERTHYROIDISM 11/09/2009   LEUKOPENIA, CHRONIC 07/28/2008    Patient Active Problem List   Diagnosis Date Noted   Pelvic pain in male 09/26/2017   Diabetes 12/09/2015   Pain of finger of  right hand 10/27/2013   Encounter for long-term (current) use of other medications 04/23/2012   Screening for prostate cancer 04/23/2012   Routine general medical examination at a health care facility 02/09/2011   LEG PAIN, RIGHT 08/09/2010   Thyrotoxicosis 11/09/2009   HYPERCHOLESTEROLEMIA 07/28/2008   Leukocytopenia 07/28/2008   Essential hypertension 04/01/2007   DIVERTICULOSIS, COLON 04/01/2007   COLONIC POLYPS, HX OF 04/01/2007    Past Surgical History:  Procedure Laterality Date   BUNIONECTOMY         Home Medications    Prior to Admission medications   Medication Sig Start Date End Date Taking? Authorizing Provider  aspirin 81 MG tablet Take 81 mg by mouth daily.   Yes [provider]  atorvastatin (LIPITOR) 10 MG tablet Take 1 tablet (10 mg total) by mouth daily. 10/02/22  Yes Carlus Pavlov, MD  Blood Glucose Monitoring Suppl (ONETOUCH VERIO FLEX SYSTEM) w/Device KIT 1 each by Does not apply route 2 (two) times daily. E11.9   Yes [provider]  Continuous Glucose Sensor (FREESTYLE LIBRE 2 SENSOR) MISC 1 Device by Does not apply route every 14 (fourteen) days. 12/05/22  Yes Carlus Pavlov, MD  dapagliflozin propanediol (FARXIGA) 10 MG TABS tablet Take 1 tablet (10 mg total) by mouth daily before breakfast. 10/03/21  Yes Romero Belling,  MD  dorzolamide-timolol (COSOPT) 2-0.5 % ophthalmic solution SMARTSIG:In Eye(s) 09/26/22  Yes [provider]  Dulaglutide (TRULICITY) 4.5 MG/0.5ML SOPN Inject 4.5 mg as directed once a week. 10/02/22  Yes Carlus Pavlov, MD  insulin regular (NOVOLIN R) 100 units/mL injection Inject under skin 30 units with breakfast, and 8-12 units with supper. 10/02/22  Yes Carlus Pavlov, MD  Insulin Syringe-Needle U-100 (BD INSULIN SYRINGE ULTRAFINE) 31G X 5/16" 0.5 ML MISC 1 each by Does not apply route daily. 10/02/22  Yes Carlus Pavlov, MD  Lancets [facility] DELICA PLUS [place]) MISC 1 each by Other route 2 (two)  times daily. E11.9 04/11/20  Yes Romero Belling, MD  latanoprost (XALATAN) 0.005 % ophthalmic solution SMARTSIG:1 Drop(s) In Eye(s) Every Evening 08/22/22  Yes [provider]  Multiple Vitamins-Minerals (CENTRUM SILVER ULTRA MENS) TABS Take 1 tablet by mouth daily.   Yes [provider]  ondansetron (ZOFRAN ODT) 4 MG disintegrating tablet Take 1 tablet (4 mg total) by mouth every 8 (eight) hours as needed for nausea or vomiting. 04/21/20  Yes Particia Nearing, PA-C  ondansetron (ZOFRAN) 4 MG tablet Take 1 tablet (4 mg total) by mouth every 6 (six) hours. 12/10/22  Yes Rodriguez-Southworth, Nettie Elm, PA-C  [facility] VERIO test strip 1 each by Other route in the morning and at bedtime. And lancets 2/day 04/03/21  Yes Romero Belling, MD    Family History Family History  Problem Relation Age of Onset   Diabetes Brother        Type 2   Colon cancer Neg Hx        patient dose not know much about family   Esophageal cancer Neg Hx    Stomach cancer Neg Hx    Rectal cancer Neg Hx     Social History Social History   Tobacco Use   Smoking status: Never   Smokeless tobacco: Never  Substance Use Topics   Alcohol use: No    Alcohol/week: 0.0 standard drinks of alcohol   Drug use: No     Allergies   Patient has no known allergies.   Review of Systems Review of Systems as listed above in HPI   Physical Exam Triage Vital Signs ED Triage Vitals  Enc Vitals Group     BP      Pulse      Resp      Temp      Temp src      SpO2      Weight      Height      Head Circumference      Peak Flow      Pain Score      Pain Loc      Pain Edu?      Excl. in GC?    Orthostatic VS for the past 24 hrs:  BP- Lying Pulse- Lying BP- Sitting Pulse- Sitting BP- Standing at 0 minutes Pulse- Standing at 0 minutes  12/12/22 0921 116/63 82 115/62 84 112/63 89    Updated Vital Signs BP 111/63 (BP Location: Left Arm)   Pulse 88   Temp 98.4 F (36.9 C) (Oral)   Resp 18   SpO2 97%    Physical Exam Vitals reviewed.  Constitutional:      General: He is not in acute distress.    Appearance: Normal appearance. He is not ill-appearing, toxic-appearing or diaphoretic.  HENT:     Mouth/Throat:     Mouth: Mucous membranes are moist.  Eyes:  Conjunctiva/sclera: Conjunctivae normal.  Cardiovascular:     Rate and Rhythm: Normal rate.     Pulses: Normal pulses.  Pulmonary:     Effort: Pulmonary effort is normal. No respiratory distress.  Abdominal:     General: Bowel sounds are normal. There is distension.     Palpations: Abdomen is soft. There is no mass.     Tenderness: There is no abdominal tenderness. There is no guarding or rebound.  Skin:    General: Skin is warm.  Neurological:     General: No focal deficit present.     Mental Status: He is alert.     Gait: Gait normal.      UC Treatments / Results  Labs (all labs ordered are listed, but only abnormal results are displayed) Labs Reviewed - No data to display  EKG Normal sinus rhythm  Radiology No results found.  Procedures Procedures (including critical care time)  Medications Ordered in UC Medications - No data to display  Initial Impression / Assessment and Plan / UC Course  I have reviewed the triage vital signs and the nursing notes.  Pertinent labs & imaging results that were available during my care of the patient were reviewed by me and considered in my medical decision making (see chart for details).     Continued diarrhea and 1 episode of presyncope last night Patient's EKG revealed normal sinus rhythm and orthostatic vital signs were negative. His stool studies collected previously were negative.  CMP did show slightly elevated creatinine 1.5, his baseline appears to be around 1.18 and slight hyponatremia 134.  I encouraged him to continue with hydration with water and electrolytes without sugar.  If he is unable to maintain adequate status I encouraged him to present to the ER  or urgent care for fluid replacement.  I also counseled him to follow his blood sugars closely which he is able to do with his continuous glucose monitor.  I have also sent to his pharmacy or counseled him to pick up over-the-counter some simethicone that may help with his belching. I recommend he call make an appointment with his primary care provider to be seen next week.  He verbalized understanding.  Final Clinical Impressions(s) / UC Diagnoses   Final diagnoses:  None   Discharge Instructions   None    ED Prescriptions   None    PDMP not reviewed this encounter.   Claudie Leach, DO 12/12/22 [phone]

## 2022-12-12 NOTE — ED Triage Notes (Signed)
Pts wife states he nearly pass out last night she has to lay him on the floor and call 911.She states his hiccups and belching is so bad he can't breath at night. He is taking zofran and imodium still.

## 2022-12-20 ENCOUNTER — Encounter: Payer: Self-pay | Admitting: Gastroenterology

## 2023-01-01 ENCOUNTER — Other Ambulatory Visit: Payer: Self-pay

## 2023-01-01 ENCOUNTER — Other Ambulatory Visit (HOSPITAL_COMMUNITY): Payer: Self-pay

## 2023-01-01 MED ORDER — TRULICITY 4.5 MG/0.5ML ~~LOC~~ SOAJ
4.5000 mg | SUBCUTANEOUS | 2 refills | Status: DC
  Filled 2023-01-01: qty 12, 168d supply, fill #0
  Filled 2023-01-01: qty 2, 28d supply, fill #0

## 2023-01-02 ENCOUNTER — Other Ambulatory Visit: Payer: Self-pay

## 2023-01-08 ENCOUNTER — Other Ambulatory Visit: Payer: Self-pay

## 2023-02-07 ENCOUNTER — Other Ambulatory Visit: Payer: Self-pay

## 2023-02-07 MED ORDER — INSULIN REGULAR HUMAN 100 UNIT/ML IJ SOLN: INTRAMUSCULAR | 3 refills | Status: DC

## 2023-02-19 ENCOUNTER — Encounter: Payer: Self-pay | Admitting: Internal Medicine

## 2023-02-19 ENCOUNTER — Ambulatory Visit: Payer: PPO | Admitting: Internal Medicine

## 2023-02-19 VITALS — BP 122/66 | HR 91 | Ht 64.0 in | Wt 176.8 lb

## 2023-02-19 DIAGNOSIS — Z794 Long term (current) use of insulin: Secondary | ICD-10-CM | POA: Diagnosis not present

## 2023-02-19 DIAGNOSIS — Z7984 Long term (current) use of oral hypoglycemic drugs: Secondary | ICD-10-CM | POA: Diagnosis not present

## 2023-02-19 DIAGNOSIS — Z7985 Long-term (current) use of injectable non-insulin antidiabetic drugs: Secondary | ICD-10-CM

## 2023-02-19 DIAGNOSIS — E119 Type 2 diabetes mellitus without complications: Secondary | ICD-10-CM

## 2023-02-19 DIAGNOSIS — E1169 Type 2 diabetes mellitus with other specified complication: Secondary | ICD-10-CM | POA: Diagnosis not present

## 2023-02-19 DIAGNOSIS — E785 Hyperlipidemia, unspecified: Secondary | ICD-10-CM

## 2023-02-19 LAB — HEMOGLOBIN A1C: Hemoglobin A1C: 8.1

## 2023-02-19 MED ORDER — OZEMPIC (1 MG/DOSE) 4 MG/3ML ~~LOC~~ SOPN: 1.0000 mg | PEN_INJECTOR | SUBCUTANEOUS | 3 refills | Status: DC

## 2023-02-19 MED ORDER — DAPAGLIFLOZIN PROPANEDIOL 10 MG PO TABS: 10.0000 mg | ORAL_TABLET | Freq: Every day | ORAL | 3 refills | Status: AC

## 2023-02-19 NOTE — Progress Notes (Signed)
Patient ID: Albert Reed, male   DOB: Aug 04, 1951, 72 y.o.   MRN: [id number]  HPI: Albert Reed is a 72 y.o.-year-old male, returning for follow-up for DM2, dx in 1997, insulin-dependent since 2013, uncontrolled, with complications (DR). Pt. previously saw Dr. Everardo All, but last visit with me 4 months ago.  Interim history: No increased urination, blurry vision, nausea, chest pain. He had 3 urgent care visits in 11/2022 for sinusitis, diarrhea, near syncope.  Symptoms resolved since then. Since visit, he was changed to Ozempic by PCP as Trulicity was not available.  He lost 9 pounds since last visit.  Reviewed HbA1c: Lab Results  Component Value Date   HGBA1C 7.4 (A) 10/02/2022   HGBA1C 7.3 (A) 07/01/2022   HGBA1C 7.8 (H) 04/24/2022   HGBA1C 7.8 (A) 11/21/2021   HGBA1C 7.0 (A) 07/18/2021   HGBA1C 8.0 (A) 12/13/2020   HGBA1C 7.5 (A) 09/13/2020   HGBA1C 8.5 (A) 07/05/2020   HGBA1C 7.2 (A) 04/12/2020   HGBA1C 7.4 (A) 01/12/2020   Pt is on a regimen of: - Farxiga 10 mg in am - Trulicity 1.5 >> 3 >> 4.5 mg weekly >> Ozempic 0.25 mg weekly for the last 3 weeks - Novolin R insulin  55 >> 60 units in am or at lunch (not taken when sugars are low normal) >> 45-60 units >> 30 units before breakfast 25 >> 20 units after dinner >> moved 30 minutes before dinner >> 15-20 units before the snack at 11 pm >> 8-12 units before dinner She had nausea and vomiting with higher doses of Trulicity. Prev. On Invokana.  Pt checks his sugars 4x a day and they are (with receiver):  Previously:  Prev.: - am: 71, 75-125, 166 - 2h after b'fast: n/c - before lunch: 80-90 (after a walk) - 2h after lunch: n/c - before dinner:  - 1h after dinner: 76, 160-247, 314 - bedtime: n/c - nighttime: n/c Lowest sugar was 75 >> 50s >> 69; he has hypoglycemia awareness at 70.  Highest sugar was 300 >> 293 >> 300.  Glucometer: One Touch Verio flex  - no CKD, last BUN/creatinine:  Lab Results  Component Value  Date   BUN 23 12/10/2022   BUN 17 04/24/2022   CREATININE 1.50 (H) 12/10/2022   CREATININE 1.20 04/24/2022  On Farxiga.  -+ HL; last set of lipids: Lab Results  Component Value Date   CHOL 88 07/03/2022   HDL 29.30 (L) 07/03/2022   LDLCALC 25 07/03/2022   LDLDIRECT 76.0 04/24/2017   TRIG 169.0 (H) 07/03/2022   CHOLHDL 3 07/03/2022  On Lipitor 10 mg daily.  - last eye exam was 01/2023: + DR - Dr Artemio Aly Prev. 01/23/2022-Dr. 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  Last foot exam 06/2022.  She also has a h/o HTN, diverticulosis.  ROS: + see HPI  Past Medical History:  Diagnosis Date   COLONIC POLYPS, HX OF 04/01/2007   DIVERTICULOSIS, COLON 04/01/2007   DM 11/24/2008   HYPERCHOLESTEROLEMIA 07/28/2008   HYPERTENSION 04/01/2007   HYPERTHYROIDISM 11/09/2009   LEUKOPENIA, CHRONIC 07/28/2008   Past Surgical History:  Procedure Laterality Date   BUNIONECTOMY     Social History   Socioeconomic History   Marital status: Married  Spouse name: Not on file   Number of children: Not on file   Years of education: Not on file   Highest education level: Not on file  Occupational History   Occupation: WORKS [employer]    Employer: [employer]    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  Tobacco Use   Smoking status: Never   Smokeless tobacco: Never  Substance and Sexual Activity   Alcohol use: No    Alcohol/week: 0.0 standard drinks of alcohol   Drug use: No   Sexual activity: Not on file  Other Topics Concern   Not on file  Social History Narrative   Originally from [place] (in [place] since 1996)   Social Determinants of Health   Financial Resource Strain: Not on file  Food  Insecurity: Not on file  Transportation Needs: Not on file  Physical Activity: Not on file  Stress: Not on file  Social Connections: Not on file  Intimate Partner Violence: Not on file   Current Outpatient Medications on File Prior to Visit  Medication Sig Dispense Refill   aspirin 81 MG tablet Take 81 mg by mouth daily.     atorvastatin (LIPITOR) 10 MG tablet Take 1 tablet (10 mg total) by mouth daily. 90 tablet 3   Blood Glucose Monitoring Suppl (ONETOUCH VERIO FLEX SYSTEM) w/Device KIT 1 each by Does not apply route 2 (two) times daily. E11.9     Continuous Glucose Sensor (FREESTYLE LIBRE 2 SENSOR) MISC 1 Device by Does not apply route every 14 (fourteen) days. 6 each 1   dapagliflozin propanediol (FARXIGA) 10 MG TABS tablet Take 1 tablet (10 mg total) by mouth daily before breakfast. 90 tablet 3   dorzolamide-timolol (COSOPT) 2-0.5 % ophthalmic solution SMARTSIG:In Eye(s)     Dulaglutide (TRULICITY) 4.5 MG/0.5ML SOPN Inject 4.5 mg as directed once a week. 9 mL 3   Dulaglutide (TRULICITY) 4.5 MG/0.5ML SOPN Inject 4.5 mg into the skin once a week. 12 mL 2   insulin regular (NOVOLIN R) 100 units/mL injection Inject under skin 30 units with breakfast, and 8-12 units with supper. 70 mL 3   Insulin Syringe-Needle U-100 (BD INSULIN SYRINGE ULTRAFINE) 31G X 5/16" 0.5 ML MISC 1 each by Does not apply route daily. 100 each 1   Lancets (ONETOUCH DELICA PLUS LANCET33G) MISC 1 each by Other route 2 (two) times daily. E11.9 200 each 0   latanoprost (XALATAN) 0.005 % ophthalmic solution SMARTSIG:1 Drop(s) In Eye(s) Every Evening     Multiple Vitamins-Minerals (CENTRUM SILVER ULTRA MENS) TABS Take 1 tablet by mouth daily.     ondansetron (ZOFRAN ODT) 4 MG disintegrating tablet Take 1 tablet (4 mg total) by mouth every 8 (eight) hours as needed for nausea or vomiting. 21 tablet 0   ondansetron (ZOFRAN) 4 MG tablet Take 1 tablet (4 mg total) by mouth every 6 (six) hours. 12 tablet 0   ONETOUCH VERIO test  strip 1 each by Other route in the morning and at bedtime. And lancets 2/day 200 strip 2   Simethicone 80 MG TABS Take 1 tablet (80 mg total) by mouth 3 (three) times daily as needed. 14 tablet 0   No current facility-administered medications on file prior to visit.   No Known Allergies Family History  Problem Relation Age of Onset   Diabetes Brother        Type 2   Colon cancer Neg Hx        patient dose not know much about family   Esophageal  cancer Neg Hx    Stomach cancer Neg Hx    Rectal cancer Neg Hx    PE: BP 122/66   Pulse 91   Ht 5\' 4"  (1.626 m)   Wt 176 lb 12.8 oz (80.2 kg)   SpO2 99%   BMI 30.35 kg/m  Wt Readings from Last 3 Encounters:  02/19/23 176 lb 12.8 oz (80.2 kg)  10/02/22 185 lb 9.6 oz (84.2 kg)  07/23/22 185 lb (83.9 kg)   Constitutional: Slightly overweight, in NAD Eyes:  EOMI, no exophthalmos ENT: no neck masses, no cervical lymphadenopathy Cardiovascular: RRR, No MRG Respiratory: CTA B Musculoskeletal: no deformities Skin:no rashes Neurological: no tremor with outstretched hands  ASSESSMENT: 1. DM2, insulin-dependent, uncontrolled, with complications - DR  2. HL  PLAN:  1. Patient with longstanding, uncontrolled, type 2 diabetes, on oral antidiabetic regimen with SGLT2 inhibitor and also weekly GLP-1 receptor agonist and regular insulin with slightly worse control at last visit, when HbA1c returned 7.4%.  At that time, he started on a CGM and sugars appears to be increasing after dinner.  However, upon questioning, he did not follow advice of mostly his regular insulin from after 2 before dinner.  He was having a snack around 11 PM and taking the regular insulin at that time.  As a consequence, his sugars were dropping abruptly overnight, frequently to the 50s.  He had rebound hyperglycemia afterwards, peaking around 11 AM.  Sugars were dropping abruptly again afterwards but upon questioning, he did not follow advice to stop his morning regular  insulin.  At last visit I strongly advised him to move the regular insulin 30 minutes before dinner and not to take any insulin before the snack at night.  I did advise him to try to eliminate this, but if he could not, to stick with peanuts or other snacks without carbs rather than crackers.  I also advised him to reduce his regular insulin before breakfast.  At that time, he mentions that his PCP had increased his Trulicity dose to 4.5 mg weekly, which we continued. CGM interpretation: -At today's visit, we reviewed his CGM downloads: It appears that 47% of values are in target range (goal >70%), while 53% are higher than 180 (goal <25%), and 0% are lower than 70 (goal <4%).  The calculated average blood sugar is 187.  The projected HbA1c for the next 3 months (GMI) is 7.8%. -Reviewing the CGM trends, sugars are still fluctuating around the upper limit of the target range with higher blood sugars after breakfast and dinner.  He made the changes that I recommended for regular insulin at last visit and we discussed about continuing these doses for now.  PCP recommended to change Trulicity to Ozempic due to [employer] of Trulicity.  He is on the lowest dose of Ozempic, which he tolerates well.  I advised him to increase the dose to 0.5 mg weekly starting now, to use this dose for 2 weeks, and then increase to 1 mg weekly, if tolerated.  I sent a prescription for this to his pharmacy.  He agrees with the plan. - I suggested to:  Patient Instructions  Please continue: - Farxiga 10 mg daily in am - R insulin 30 minutes before meals: 30 units before b'fast 8-12 units before dinner  Increase: - Ozempic to 0.5 mg weekly x 2 weeks, then to 1 mg weekly.  Please return in 3-4 months.  - we checked his HbA1c: 8.1% (higher) - advised to check  sugars at different times of the day - 4x a day, rotating check times - advised for yearly eye exams >> he is UTD - return to clinic in 3-4 months  2.  HL -Reviewed the latest lipid panel from 06/2022: LDL at goal, triglycerides slightly high, HDL low: Lab Results  Component Value Date   CHOL 88 07/03/2022   HDL 29.30 (L) 07/03/2022   LDLCALC 25 07/03/2022   LDLDIRECT 76.0 04/24/2017   TRIG 169.0 (H) 07/03/2022   CHOLHDL 3 07/03/2022  -He continues Lipitor 10 mg daily, tolerated well  Carlus Pavlov, MD PhD [facility] Endocrinology

## 2023-02-19 NOTE — Patient Instructions (Addendum)
Please continue: - Farxiga 10 mg daily in am - R insulin 30 minutes before meals: 30 units before b'fast 8-12 units before dinner  Increase: - Ozempic to 0.5 mg weekly x 2 weeks, then to 1 mg weekly.  Please return in 3-4 months.

## 2023-04-09 ENCOUNTER — Other Ambulatory Visit: Payer: Self-pay | Admitting: Internal Medicine

## 2023-04-10 LAB — CBC
HCT: 42.8 % (ref 38.5–50.0)
Hemoglobin: 13.9 g/dL (ref 13.2–17.1)
MCH: 29.8 pg (ref 27.0–33.0)
MCHC: 32.5 g/dL (ref 32.0–36.0)
MCV: 91.8 fL (ref 80.0–100.0)
MPV: 12 fL (ref 7.5–12.5)
Platelets: 165 10*3/uL (ref 140–400)
RBC: 4.66 10*6/uL (ref 4.20–5.80)
RDW: 15.3 % — ABNORMAL HIGH (ref 11.0–15.0)
WBC: 4 10*3/uL (ref 3.8–10.8)

## 2023-04-10 LAB — COMPLETE METABOLIC PANEL WITH GFR
AG Ratio: 1.6 (calc) (ref 1.0–2.5)
ALT: 19 U/L (ref 9–46)
AST: 15 U/L (ref 10–35)
Albumin: 4.1 g/dL (ref 3.6–5.1)
Alkaline phosphatase (APISO): 87 U/L (ref 35–144)
BUN: 19 mg/dL (ref 7–25)
CO2: 22 mmol/L (ref 20–32)
Calcium: 8.8 mg/dL (ref 8.6–10.3)
Chloride: 102 mmol/L (ref 98–110)
Creat: 1.17 mg/dL (ref 0.70–1.28)
Globulin: 2.6 g/dL (ref 1.9–3.7)
Glucose, Bld: 261 mg/dL — ABNORMAL HIGH (ref 65–99)
Potassium: 4.5 mmol/L (ref 3.5–5.3)
Sodium: 138 mmol/L (ref 135–146)
Total Bilirubin: 0.3 mg/dL (ref 0.2–1.2)
Total Protein: 6.7 g/dL (ref 6.1–8.1)
eGFR: 67 mL/min/{1.73_m2} (ref >=60)

## 2023-04-10 LAB — LIPID PANEL
Cholesterol: 88 mg/dL (ref <200)
HDL: 34 mg/dL — ABNORMAL LOW (ref >=40)
LDL Cholesterol (Calc): 27 mg/dL
Non-HDL Cholesterol (Calc): 54 mg/dL (ref <130)
Total CHOL/HDL Ratio: 2.6 (calc) (ref <5.0)
Triglycerides: 207 mg/dL — ABNORMAL HIGH (ref <150)

## 2023-04-10 LAB — VITAMIN D 25 HYDROXY (VIT D DEFICIENCY, FRACTURES): Vit D, 25-Hydroxy: 40 ng/mL (ref 30–100)

## 2023-04-10 LAB — PSA: PSA: 0.21 ng/mL (ref <=4.00)

## 2023-04-10 LAB — TSH: TSH: 2.06 m[IU]/L (ref 0.40–4.50)

## 2023-05-13 ENCOUNTER — Other Ambulatory Visit: Payer: Self-pay | Admitting: Internal Medicine

## 2023-05-13 DIAGNOSIS — E119 Type 2 diabetes mellitus without complications: Secondary | ICD-10-CM

## 2023-06-25 ENCOUNTER — Ambulatory Visit: Payer: PPO | Admitting: Internal Medicine

## 2023-06-25 ENCOUNTER — Encounter: Payer: Self-pay | Admitting: Internal Medicine

## 2023-06-25 VITALS — BP 102/52 | HR 98 | Ht 64.0 in | Wt 176.8 lb

## 2023-06-25 DIAGNOSIS — E785 Hyperlipidemia, unspecified: Secondary | ICD-10-CM

## 2023-06-25 DIAGNOSIS — Z794 Long term (current) use of insulin: Secondary | ICD-10-CM | POA: Diagnosis not present

## 2023-06-25 DIAGNOSIS — E1169 Type 2 diabetes mellitus with other specified complication: Secondary | ICD-10-CM | POA: Diagnosis not present

## 2023-06-25 DIAGNOSIS — E113512 Type 2 diabetes mellitus with proliferative diabetic retinopathy with macular edema, left eye: Secondary | ICD-10-CM

## 2023-06-25 DIAGNOSIS — E1165 Type 2 diabetes mellitus with hyperglycemia: Secondary | ICD-10-CM

## 2023-06-25 LAB — POCT GLYCOSYLATED HEMOGLOBIN (HGB A1C): Hemoglobin A1C: 7.4 % — AB (ref 4.0–5.6)

## 2023-06-25 LAB — MICROALBUMIN / CREATININE URINE RATIO
Creatinine,U: 40.2 mg/dL
Microalb Creat Ratio: 1.7 mg/g (ref 0.0–30.0)
Microalb, Ur: <0.7 mg/dL (ref 0.0–1.9)

## 2023-06-25 MED ORDER — LYUMJEV KWIKPEN 200 UNIT/ML ~~LOC~~ SOPN: PEN_INJECTOR | SUBCUTANEOUS | 3 refills | Status: DC

## 2023-06-25 MED ORDER — INSULIN PEN NEEDLE 32G X 4 MM MISC: 3 refills | Status: DC

## 2023-06-25 NOTE — Patient Instructions (Addendum)
Please continue: - Farxiga 10 mg daily in am - Ozempic 2 mg weekly  Change: - R insulin 30 minutes before meals: 24-30 units before b'fast (if you take the insulin after you eat, reduce the dose to 20-24 units) 8-12 units before dinner If you are able to switch to Lyumjev, inject same doses right before you eat.  Please return in 3-4 months.

## 2023-06-25 NOTE — Progress Notes (Unsigned)
Patient ID: Albert Reed, male   DOB: October 14, 1950, 72 y.o.   MRN: [id number]  HPI: Albert Reed is a 72 y.o.-year-old male, returning for follow-up for DM2, dx in 1997, insulin-dependent since 2013, uncontrolled, with complications (DR). Pt. previously saw Dr. Everardo Reed, but last visit with me 4 months ago.  Interim history: No increased urination, blurry vision, nausea, chest pain.  He lost 9 pounds before last visit, after switching from Trulicity to [employer].  He feels better on Ozempic.  The dose was increased to 2 mg weekly since last visit.  Reviewed HbA1c: Lab Results  Component Value Date   HGBA1C 8.1 02/19/2023   HGBA1C 7.4 (A) 10/02/2022   HGBA1C 7.3 (A) 07/01/2022   HGBA1C 7.8 (H) 04/24/2022   HGBA1C 7.8 (A) 11/21/2021   HGBA1C 7.0 (A) 07/18/2021   HGBA1C 8.0 (A) 12/13/2020   HGBA1C 7.5 (A) 09/13/2020   HGBA1C 8.5 (A) 07/05/2020   HGBA1C 7.2 (A) 04/12/2020   Pt is on a regimen of: - Farxiga 10 mg in am - Trulicity 1.5 >> 3 >> 4.5 mg weekly >> Ozempic 0.25 >> 0.5 >> 1 mg >> 2 mg weekly  - Novolin R insulin  55 >> 60 units in am or at lunch (not taken when sugars are low normal) >> 45-60 units >> 30 units before breakfast 25 >> 20 units after dinner >> moved 30 minutes before dinner >> 15-20 units before the snack at 11 pm >> 8-12 units before dinner She had nausea and vomiting with higher doses of Trulicity. Prev. On Invokana.  Pt checks his sugars 4x a day and they are (with receiver):  Previously:  Previously:  Lowest sugar was 75 >> 50s >> 69 >> 62; he has hypoglycemia awareness at 70.  Highest sugar was 300 >> 293 >> 300 >> 200s.  Glucometer: One Touch Verio flex  - no CKD, last BUN/creatinine:  Lab Results  Component Value Date   BUN 19 04/09/2023   BUN 23 12/10/2022   CREATININE 1.17 04/09/2023   CREATININE 1.50 (H) 12/10/2022   Lab Results  Component Value Date   MICRALBCREAT 0.8 04/24/2017   MICRALBCREAT 1.2 03/08/2016   MICRALBCREAT 0.7  12/08/2015   MICRALBCREAT 1.3 03/30/2014   MICRALBCREAT 1.5 04/21/2013   MICRALBCREAT 0.4 04/23/2012   MICRALBCREAT 14.7 11/09/2009   MICRALBCREAT 18.8 07/28/2008   MICRALBCREAT 6.9 10/22/2006  On Farxiga.  -+ HL; last set of lipids: Lab Results  Component Value Date   CHOL 88 04/09/2023   HDL 34 (L) 04/09/2023   LDLCALC 27 04/09/2023   LDLDIRECT 76.0 04/24/2017   TRIG 207 (H) 04/09/2023   CHOLHDL 2.6 04/09/2023  On Lipitor 10 mg daily.  - last eye exam was 01/2023: + DR - Dr Albert Reed Prev. 01/23/2022-Dr. 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  Last foot exam 06/2022.  She also has a h/o HTN, diverticulosis.    ROS: + see HPI  Past Medical History:  Diagnosis Date   COLONIC POLYPS, HX OF 04/01/2007   DIVERTICULOSIS, COLON 04/01/2007   DM 11/24/2008   HYPERCHOLESTEROLEMIA 07/28/2008   HYPERTENSION 04/01/2007   HYPERTHYROIDISM 11/09/2009   LEUKOPENIA, CHRONIC 07/28/2008   Past Surgical History:  Procedure Laterality Date   BUNIONECTOMY  Social History   Socioeconomic History   Marital status: Married    Spouse name: Not on file   Number of children: Not on file   Years of education: Not on file   Highest education level: Not on file  Occupational History   Occupation: WORKS [employer]    Employer: [employer]    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  Tobacco Use   Smoking status: Never   Smokeless tobacco: Never  Substance and Sexual Activity   Alcohol use: No    Alcohol/week: 0.0 standard drinks of alcohol   Drug use: No   Sexual activity: Not on file  Other Topics Concern   Not on file  Social History Narrative   Originally from [place] (in [place] since 1996)    Social Determinants of Health   Financial Resource Strain: Not on file  Food Insecurity: Not on file  Transportation Needs: Not on file  Physical Activity: Not on file  Stress: Not on file  Social Connections: Not on file  Intimate Partner Violence: Not on file   Current Outpatient Medications on File Prior to Visit  Medication Sig Dispense Refill   aspirin 81 MG tablet Take 81 mg by mouth daily.     atorvastatin (LIPITOR) 10 MG tablet Take 1 tablet (10 mg total) by mouth daily. 90 tablet 3   Blood Glucose Monitoring Suppl (ONETOUCH VERIO FLEX SYSTEM) w/Device KIT 1 each by Does not apply route 2 (two) times daily. E11.9     Continuous Glucose Sensor (FREESTYLE LIBRE 2 SENSOR) MISC ONE device by mot APPLY route EVERY 14 DAYS 6 each 1   dapagliflozin propanediol (FARXIGA) 10 MG TABS tablet Take 1 tablet (10 mg total) by mouth daily before breakfast. 90 tablet 3   dorzolamide-timolol (COSOPT) 2-0.5 % ophthalmic solution SMARTSIG:In Eye(s)     insulin regular (NOVOLIN R) 100 units/mL injection Inject under skin 30 units with breakfast, and 8-12 units with supper. 70 mL 3   Insulin Syringe-Needle U-100 (BD INSULIN SYRINGE ULTRAFINE) 31G X 5/16" 0.5 ML MISC 1 each by Does not apply route daily. 100 each 1   Lancets (ONETOUCH DELICA PLUS LANCET33G) MISC 1 each by Other route 2 (two) times daily. E11.9 200 each 0   latanoprost (XALATAN) 0.005 % ophthalmic solution SMARTSIG:1 Drop(s) In Eye(s) Every Evening     Multiple Vitamins-Minerals (CENTRUM SILVER ULTRA MENS) TABS Take 1 tablet by mouth daily.     ondansetron (ZOFRAN ODT) 4 MG disintegrating tablet Take 1 tablet (4 mg total) by mouth every 8 (eight) hours as needed for nausea or vomiting. 21 tablet 0   ondansetron (ZOFRAN) 4 MG tablet Take 1 tablet (4 mg total) by mouth every 6 (six) hours. 12 tablet 0   ONETOUCH VERIO test strip 1 each by Other route in the morning and at bedtime. And lancets 2/day 200 strip 2   Semaglutide, 1 MG/DOSE,  (OZEMPIC, 1 MG/DOSE,) 4 MG/3ML SOPN Inject 1 mg into the skin once a week. 9 mL 3   Simethicone 80 MG TABS Take 1 tablet (80 mg total) by mouth 3 (three) times daily as needed. 14 tablet 0   No current facility-administered medications on file prior to visit.   No Known Allergies Family History  Problem Relation Age of Onset   Diabetes Brother        Type 2   Colon cancer Neg Hx        patient dose not know much about family   Esophageal cancer Neg  Hx    Stomach cancer Neg Hx    Rectal cancer Neg Hx    PE: There were no vitals taken for this visit. Wt Readings from Last 3 Encounters:  02/19/23 176 lb 12.8 oz (80.2 kg)  10/02/22 185 lb 9.6 oz (84.2 kg)  07/23/22 185 lb (83.9 kg)   Constitutional: Slightly overweight, in NAD Eyes:  EOMI, no exophthalmos ENT: no neck masses, no cervical lymphadenopathy Cardiovascular: RRR, No MRG Respiratory: CTA B Musculoskeletal: no deformities Skin:no rashes Neurological: no tremor with outstretched hands Diabetic Foot Exam - Simple   Simple Foot Form Diabetic Foot exam was performed with the following findings: Yes 06/25/2023 10:49 AM  Visual Inspection No deformities, no ulcerations, no other skin breakdown bilaterally: Yes Sensation Testing Intact to touch and monofilament testing bilaterally: Yes Pulse Check Posterior Tibialis and Dorsalis pulse intact bilaterally: Yes Comments    ASSESSMENT: 1. DM2, insulin-dependent, uncontrolled, with complications - DR  2. HL  PLAN:  1. Patient with longstanding, uncontrolled, type 2 diabetes, on oral antidiabetic regimen with SGLT2 inhibitor, also short acting insulin and weekly GLP-1 receptor agonist, with worse control at last visit, when HbA1c increased from 7.4% to 8.1%.  At that time, sugars were fluctuating around the upper limit of the target range with higher blood sugars after breakfast and dinner.  PCP recommended to change from Trulicity to Ozempic due to [employer] of  Trulicity and at last visit he was on the lowest dose of Ozempic.  I advised him to increase the dose up to 1 mg weekly.  We continued the rest of the regimen. CGM interpretation: -At today's visit, we reviewed his CGM downloads: It appears that 79% of values are in target range (goal >70%), while 17% are higher than 180 (goal <25%), and 4% are lower than 70 (goal <4%).  The calculated average blood sugar is 139.  The projected HbA1c for the next 3 months (GMI) is 6.6%. -Reviewing the CGM trends, sugars appear to be improved from before, fluctuating within the target range but with significant increases after breakfast and then a milder increase after lunch.  Upon questioning, he sometimes forgets to take the insulin dose 30 minutes before meals and when he does so, he takes a full dose when the sugars already high after meals.  We discussed that this is conducive to low blood sugars, which he more commonly sees between 12 PM and 4 PM.  We discussed  about switching from regular insulin (uses vials) to Lyumjev, which is injected right before meals.  I am hoping that he is able to obtain this.  Will continue the same doses, but I advised him to lower the dose before breakfast if he eats smaller meals.  If he is not able to use Lyumjev, he can continue with the regular insulin but taken 30 minutes before meals.  If he forgets, I advised him to take a lower dose after the meal. - I suggested to:  Patient Instructions  Please continue: - Farxiga 10 mg daily in am - Ozempic 2 mg weekly  Change: - R insulin 30 minutes before meals: 24-30 units before b'fast (if you take the insulin after you eat, reduce the dose to 20-24 units) 8-12 units before dinner If you are able to switch to Lyumjev, inject same doses right before you eat.  Please return in 3-4 months.  - we checked his HbA1c: 7.4% (lower) - advised to check sugars at different times of the day - 4x  a day, rotating check times - advised for  yearly eye exams >> he is UTD - return to clinic in 3-4 months  2. HL -Reviewed latest lipid panel from 03/2023: Excellent LDL, HDL slightly low, triglycerides elevated: Lab Results  Component Value Date   CHOL 88 04/09/2023   HDL 34 (L) 04/09/2023   LDLCALC 27 04/09/2023   LDLDIRECT 76.0 04/24/2017   TRIG 207 (H) 04/09/2023   CHOLHDL 2.6 04/09/2023  -He continues Lipitor 10 mg daily tolerated well  Carlus Pavlov, MD PhD [facility] Endocrinology

## 2023-07-08 ENCOUNTER — Encounter: Payer: Self-pay | Admitting: Gastroenterology

## 2023-08-02 ENCOUNTER — Ambulatory Visit (INDEPENDENT_AMBULATORY_CARE_PROVIDER_SITE_OTHER): Payer: PPO

## 2023-08-02 ENCOUNTER — Ambulatory Visit (HOSPITAL_COMMUNITY)
Admission: EM | Admit: 2023-08-02 | Discharge: 2023-08-02 | Disposition: A | Discharge status: Home / Self Care | Payer: PPO | Attending: Internal Medicine | Admitting: Internal Medicine

## 2023-08-02 ENCOUNTER — Encounter (HOSPITAL_COMMUNITY): Payer: Self-pay

## 2023-08-02 DIAGNOSIS — M542 Cervicalgia: Secondary | ICD-10-CM

## 2023-08-02 DIAGNOSIS — W19XXXA Unspecified fall, initial encounter: Secondary | ICD-10-CM | POA: Diagnosis not present

## 2023-08-02 DIAGNOSIS — M62838 Other muscle spasm: Secondary | ICD-10-CM | POA: Diagnosis not present

## 2023-08-02 MED ORDER — DEXAMETHASONE SODIUM PHOSPHATE 10 MG/ML IJ SOLN
INTRAMUSCULAR | Status: AC
  Filled 2023-08-02: qty 1

## 2023-08-02 MED ORDER — KETOROLAC TROMETHAMINE 30 MG/ML IJ SOLN
INTRAMUSCULAR | Status: AC
  Filled 2023-08-02: qty 1

## 2023-08-02 MED ORDER — METHOCARBAMOL 500 MG PO TABS: 500.0000 mg | ORAL_TABLET | Freq: Two times a day (BID) | ORAL | 0 refills | Status: AC | PRN

## 2023-08-02 MED ORDER — DEXAMETHASONE SODIUM PHOSPHATE 10 MG/ML IJ SOLN
10.0000 mg | Freq: Once | INTRAMUSCULAR | Status: AC
  Administered 2023-08-02: 10 mg via INTRAMUSCULAR

## 2023-08-02 MED ORDER — PREDNISONE 10 MG PO TABS: 40.0000 mg | ORAL_TABLET | Freq: Every day | ORAL | 0 refills | Status: AC

## 2023-08-02 MED ORDER — KETOROLAC TROMETHAMINE 30 MG/ML IJ SOLN
30.0000 mg | Freq: Once | INTRAMUSCULAR | Status: AC
  Administered 2023-08-02: 30 mg via INTRAMUSCULAR

## 2023-08-02 NOTE — ED Provider Notes (Signed)
[facility]    CSN: [id number] Arrival date & time: 08/02/23  1107      History   Chief Complaint No chief complaint on file.   HPI Albert Reed is a 72 y.o. male.   72 year old male who presents to urgent care with complaints of neck pain and decreased range of motion.  He reports that on Thursday he fell onto his left side on carpeted floor.  He did not hurt at first but then developed pain later in the day.  This progressed to having difficulty turning his head to the left or right which interferes with his ability to drive.  He denies any numbness or tingling into the arms or lower extremities.  He denies any history of neck problems in the past.      Past Medical History:  Diagnosis Date   COLONIC POLYPS, HX OF 04/01/2007   DIVERTICULOSIS, COLON 04/01/2007   DM 11/24/2008   HYPERCHOLESTEROLEMIA 07/28/2008   HYPERTENSION 04/01/2007   HYPERTHYROIDISM 11/09/2009   LEUKOPENIA, CHRONIC 07/28/2008    Patient Active Problem List   Diagnosis Date Noted   Pelvic pain in male 09/26/2017   Diabetes (HCC) 12/09/2015   Pain of finger of right hand 10/27/2013   Encounter for long-term (current) use of other medications 04/23/2012   Screening for prostate cancer 04/23/2012   Routine general medical examination at a health care facility 02/09/2011   LEG PAIN, RIGHT 08/09/2010   Thyrotoxicosis 11/09/2009   HYPERCHOLESTEROLEMIA 07/28/2008   Leukocytopenia 07/28/2008   Essential hypertension 04/01/2007   Diverticulosis of colon 04/01/2007   History of colonic polyps 04/01/2007    Past Surgical History:  Procedure Laterality Date   BUNIONECTOMY         Home Medications    Prior to Admission medications   Medication Sig Start Date End Date Taking? Authorizing Provider  aspirin 81 MG tablet Take 81 mg by mouth daily.   Yes [provider]  atorvastatin (LIPITOR) 10 MG tablet Take 1 tablet (10 mg total) by mouth daily. 10/02/22  Yes Carlus Pavlov, MD   Blood Glucose Monitoring Suppl (ONETOUCH VERIO FLEX SYSTEM) w/Device KIT 1 each by Does not apply route 2 (two) times daily. E11.9   Yes [provider]  Continuous Glucose Sensor (FREESTYLE LIBRE 2 SENSOR) MISC ONE device by mot APPLY route EVERY 14 DAYS 05/13/23  Yes Carlus Pavlov, MD  dapagliflozin propanediol (FARXIGA) 10 MG TABS tablet Take 1 tablet (10 mg total) by mouth daily before breakfast. 02/19/23  Yes Carlus Pavlov, MD  dorzolamide-timolol (COSOPT) 2-0.5 % ophthalmic solution SMARTSIG:In Eye(s) 09/26/22  Yes [provider]  Insulin Lispro-aabc (LYUMJEV KWIKPEN) 200 UNIT/ML KwikPen Inject under skin 24-30 units in am nd 8-12 units before dinner 06/25/23  Yes Carlus Pavlov, MD  Insulin Pen Needle 32G X 4 MM MISC Use 2x a day 06/25/23  Yes Carlus Pavlov, MD  insulin regular (NOVOLIN R) 100 units/mL injection Inject under skin 30 units with breakfast, and 8-12 units with supper. 02/07/23  Yes Carlus Pavlov, MD  Insulin Syringe-Needle U-100 (BD INSULIN SYRINGE ULTRAFINE) 31G X 5/16" 0.5 ML MISC 1 each by Does not apply route daily. 10/02/22  Yes Carlus Pavlov, MD  Multiple Vitamins-Minerals (CENTRUM SILVER ULTRA MENS) TABS Take 1 tablet by mouth daily.   Yes [provider]  ondansetron (ZOFRAN ODT) 4 MG disintegrating tablet Take 1 tablet (4 mg total) by mouth every 8 (eight) hours as needed for nausea or vomiting. 04/21/20  Yes Maurice March,  Salley Hews, PA-C  [facility] VERIO test strip 1 each by Other route in the morning and at bedtime. And lancets 2/day 04/03/21  Yes Romero Belling, MD  Semaglutide, 1 MG/DOSE, (OZEMPIC, 1 MG/DOSE,) 4 MG/3ML SOPN Inject 1 mg into the skin once a week. 02/19/23  Yes Carlus Pavlov, MD  Simethicone 80 MG TABS Take 1 tablet (80 mg total) by mouth 3 (three) times daily as needed. 12/12/22  Yes Tedrowe, Marcelino Duster A, DO  Lancets (ONETOUCH DELICA PLUS LANCET33G) MISC 1 each by Other route 2 (two) times daily. E11.9 04/11/20    Romero Belling, MD  latanoprost (XALATAN) 0.005 % ophthalmic solution SMARTSIG:1 Drop(s) In Eye(s) Every Evening 08/22/22   [provider]    Family History Family History  Problem Relation Age of Onset   Diabetes Brother        Type 2   Colon cancer Neg Hx        patient dose not know much about family   Esophageal cancer Neg Hx    Stomach cancer Neg Hx    Rectal cancer Neg Hx     Social History Social History   Tobacco Use   Smoking status: Never   Smokeless tobacco: Never  Substance Use Topics   Alcohol use: No    Alcohol/week: 0.0 standard drinks of alcohol   Drug use: No     Allergies   Patient has no known allergies.   Review of Systems Review of Systems  Constitutional:  Negative for chills and fever.  HENT:  Negative for ear pain and sore throat.   Eyes:  Negative for pain and visual disturbance.  Respiratory:  Negative for cough and shortness of breath.   Cardiovascular:  Negative for chest pain and palpitations.  Gastrointestinal:  Negative for abdominal pain and vomiting.  Genitourinary:  Negative for dysuria and hematuria.  Musculoskeletal:  Positive for neck pain. Negative for arthralgias and back pain.  Skin:  Negative for color change and rash.  Neurological:  Negative for seizures and syncope.  All other systems reviewed and are negative.    Physical Exam Triage Vital Signs ED Triage Vitals [08/02/23 1222]  Encounter Vitals Group     BP (!) 165/77     Systolic BP Percentile      Diastolic BP Percentile      Pulse Rate (!) 103     Resp 16     Temp 98.9 F (37.2 C)     Temp Source Oral     SpO2 97 %     Weight      Height      Head Circumference      Peak Flow      Pain Score 6     Pain Loc      Pain Education      Exclude from Growth Chart    No data found.  Updated Vital Signs BP (!) 165/77 (BP Location: Left Arm)   Pulse (!) 103   Temp 98.9 F (37.2 C) (Oral)   Resp 16   SpO2 97%   Visual Acuity Right Eye  Distance:   Left Eye Distance:   Bilateral Distance:    Right Eye Near:   Left Eye Near:    Bilateral Near:     Physical Exam Vitals and nursing note reviewed.  Constitutional:      General: He is not in acute distress.    Appearance: He is well-developed.  HENT:     Head: Normocephalic and  atraumatic.  Eyes:     Conjunctiva/sclera: Conjunctivae normal.  Cardiovascular:     Rate and Rhythm: Normal rate and regular rhythm.     Heart sounds: No murmur heard. Pulmonary:     Effort: Pulmonary effort is normal. No respiratory distress.     Breath sounds: Normal breath sounds.  Abdominal:     Palpations: Abdomen is soft.     Tenderness: There is no abdominal tenderness.  Musculoskeletal:        General: No swelling.     Cervical back: Rigidity present. No signs of trauma, torticollis or crepitus. Pain with movement present. Decreased range of motion.  Skin:    General: Skin is warm and dry.     Capillary Refill: Capillary refill takes less than 2 seconds.  Neurological:     Mental Status: He is alert.  Psychiatric:        Mood and Affect: Mood normal.      UC Treatments / Results  Labs (all labs ordered are listed, but only abnormal results are displayed) Labs Reviewed - No data to display  EKG   Radiology No results found.  Procedures Procedures (including critical care time)  Medications Ordered in UC Medications - No data to display  Initial Impression / Assessment and Plan / UC Course  I have reviewed the triage vital signs and the nursing notes.  Pertinent labs & imaging results that were available during my care of the patient were reviewed by me and considered in my medical decision making (see chart for details).     Fall, initial encounter  Neck pain  Neck muscle spasm   X-ray done today and shows no evidence of a fracture however there is evidence of arthritis.  The fall likely caused a flareup of the arthritis.  There is also evidence of  muscle spasm present.  We will treat with the following:  Toradol injection given for pain and Decadron injection given to reduce inflammation Prednisone 40 mg daily starting 12/15.  Take this in the mornings Robaxin 500 mg twice daily as needed for muscle spasms.  Use caution as this can cause drowsiness.  Do not drive or drink alcohol while taking this medication. May need to consider follow-up with orthopedics if symptoms do not resolve  Final Clinical Impressions(s) / UC Diagnoses   Final diagnoses:  None   Discharge Instructions   None    ED Prescriptions   None    PDMP not reviewed this encounter.   Landis Martins, [place] 08/02/23 1404

## 2023-08-02 NOTE — ED Triage Notes (Signed)
Pt presents to the office for neck pain and back pain due to a fall. No LOC.

## 2023-08-02 NOTE — Discharge Instructions (Addendum)
X-ray done today and shows no evidence of a fracture however there is evidence of arthritis.  The fall likely caused a flareup of the arthritis.  There is also evidence of muscle spasm present.  We will treat with the following:  Toradol injection given for pain and Decadron injection given to reduce inflammation Prednisone 40 mg daily starting 12/15.  Take this in the mornings Robaxin 500 mg twice daily as needed for muscle spasms.  Use caution as this can cause drowsiness.  Do not drive or drink alcohol while taking this medication. May need to consider follow-up with orthopedics if symptoms do not resolve

## 2023-09-26 ENCOUNTER — Ambulatory Visit (AMBULATORY_SURGERY_CENTER): Payer: PPO | Admitting: *Deleted

## 2023-09-26 ENCOUNTER — Encounter: Payer: Self-pay | Admitting: Gastroenterology

## 2023-09-26 VITALS — Ht 64.0 in | Wt 180.0 lb

## 2023-09-26 DIAGNOSIS — Z860100 Personal history of colon polyps, unspecified: Secondary | ICD-10-CM

## 2023-09-26 MED ORDER — SUFLAVE 178.7 G PO SOLR
1.0000 | Freq: Once | ORAL | 0 refills | Status: AC
Start: 2023-09-26 — End: 2023-09-26

## 2023-09-26 NOTE — Progress Notes (Signed)
 Pt's name and DOB verified at the beginning of the pre-visit wit 2 identifiers  Pt denies any difficulty with ambulating,sitting, laying down or rolling side to side  Pt has no issues with ambulation   Pt has no issues moving head neck or swallowing  No egg or soy allergy known to patient   Patient denies ever being intubated   No issues with colonoscopy anesthesia  No FH of Malignant Hyperthermia  Pt is not on diet pills or shots  Pt is not on home 02   Pt is not on blood thinners   Pt denies issues with constipation   Pt is not on dialysis  Pt denise any abnormal heart rhythms   Pt denies any upcoming cardiac testing  Pt encouraged to use to use Singlecare or Goodrx to reduce cost   Patient's chart reviewed by Norleen Schillings CNRA prior to pre-visit and patient appropriate for the [facility].  Pre-visit completed and red dot placed by patient's name on their procedure day (on provider's schedule).  .  Visit by phone  Pt states weight is 180 lb  Instructed pt why it is important to and  to call if they have any changes in health or new medications. Directed them to the # given and on instructions.     Instructions reviewed. Pt given both [facility] as well as  [facility] main # and MD on call # prior to instructions.  Pt states understanding. Instructed to review again prior to procedure. Pt states they will.   Informed pt that they will receive a call from [facility] regarding there prep med.

## 2023-10-03 ENCOUNTER — Telehealth: Payer: Self-pay | Admitting: Gastroenterology

## 2023-10-03 ENCOUNTER — Telehealth: Payer: Self-pay | Admitting: *Deleted

## 2023-10-03 ENCOUNTER — Other Ambulatory Visit: Payer: Self-pay

## 2023-10-03 DIAGNOSIS — Z860100 Personal history of colon polyps, unspecified: Secondary | ICD-10-CM

## 2023-10-03 MED ORDER — PEG 3350-KCL-NA BICARB-NACL 420 G PO SOLR
4000.0000 mL | Freq: Once | ORAL | 0 refills | Status: AC
Start: 2023-10-03 — End: 2023-10-03

## 2023-10-03 NOTE — Telephone Encounter (Signed)
Patient called and stated that his insurance was not going to pay from his prep medication. Patient stated that he is not a generic prep that his insurance will pay for. Patient also stated that he would like prep medication to go to [facility] on [address]. Please advise.

## 2023-10-03 NOTE — Telephone Encounter (Signed)
Unavble to reach pt LM informing him that his prep will be sent to pharmacy in profile and new set of instructions to be sent via My Chart.

## 2023-10-03 NOTE — Telephone Encounter (Signed)
Unable to reach pt Albert Reed letting him know new set of instructions to  My Chart and medications are sent to his pharmacy listed in profile

## 2023-10-07 ENCOUNTER — Encounter: Payer: Self-pay | Admitting: Certified Registered Nurse Anesthetist

## 2023-10-10 ENCOUNTER — Encounter: Payer: Self-pay | Admitting: Gastroenterology

## 2023-10-10 ENCOUNTER — Telehealth: Payer: Self-pay | Admitting: Gastroenterology

## 2023-10-10 ENCOUNTER — Ambulatory Visit: Payer: PPO | Admitting: Gastroenterology

## 2023-10-10 VITALS — BP 121/64 | HR 70 | Temp 97.9°F | Resp 12 | Ht 64.0 in | Wt 180.0 lb

## 2023-10-10 DIAGNOSIS — Z1211 Encounter for screening for malignant neoplasm of colon: Secondary | ICD-10-CM | POA: Diagnosis not present

## 2023-10-10 DIAGNOSIS — D124 Benign neoplasm of descending colon: Secondary | ICD-10-CM

## 2023-10-10 DIAGNOSIS — K641 Second degree hemorrhoids: Secondary | ICD-10-CM | POA: Diagnosis not present

## 2023-10-10 DIAGNOSIS — K562 Volvulus: Secondary | ICD-10-CM | POA: Diagnosis not present

## 2023-10-10 DIAGNOSIS — Z860101 Personal history of adenomatous and serrated colon polyps: Secondary | ICD-10-CM

## 2023-10-10 MED ORDER — SODIUM CHLORIDE 0.9 % IV SOLN: 500.0000 mL | Freq: Once | INTRAVENOUS | Status: AC

## 2023-10-10 NOTE — Op Note (Signed)
[facility] Patient Name: Albert Reed Procedure Date: 10/10/2023 1:57 PM MRN: [id number] Endoscopist: Corliss Parish , MD, [id number] Age: 73 Referring MD:  Date of Birth: [birth year] Gender: Male Account #: [id number] Procedure:                Colonoscopy Indications:              Surveillance: Personal history of adenomatous                            polyps on last colonoscopy > 3 years ago Medicines:                Monitored Anesthesia Care Procedure:                Pre-Anesthesia Assessment:                           - Prior to the procedure, a History and Physical                            was performed, and patient medications and                            allergies were reviewed. The patient's tolerance of                            previous anesthesia was also reviewed. The risks                            and benefits of the procedure and the sedation                            options and risks were discussed with the patient.                            All questions were answered, and informed consent                            was obtained. Prior Anticoagulants: The patient has                            taken no anticoagulant or antiplatelet agents. ASA                            Grade Assessment: II - A patient with mild systemic                            disease. After reviewing the risks and benefits,                            the patient was deemed in satisfactory condition to                            undergo the procedure.  After obtaining informed consent, the colonoscope                            was passed under direct vision. Throughout the                            procedure, the patient's blood pressure, pulse, and                            oxygen saturations were monitored continuously. The                            CF [id number] #[id number] was introduced through the anus                            and advanced to  the the cecum, identified by                            appendiceal orifice and ileocecal valve. The                            colonoscopy was somewhat difficult due to                            significant looping. Successful completion of the                            procedure was aided by changing the patient's                            position, using manual pressure, straightening and                            shortening the scope to obtain bowel loop reduction                            and using scope torsion. The patient tolerated the                            procedure. The quality of the bowel preparation was                            good. The ileocecal valve, appendiceal orifice, and                            rectum were photographed. Scope In: 2:08:59 PM Scope Out: 2:23:28 PM Scope Withdrawal Time: 0 hours 9 minutes 9 seconds  Total Procedure Duration: 0 hours 14 minutes 29 seconds  Findings:                 The digital rectal exam findings include                            hemorrhoids. Pertinent negatives include no  palpable rectal lesions.                           The colon (entire examined portion) revealed                            significantly excessive looping.                           A 5 mm polyp was found in the descending colon. The                            polyp was sessile. The polyp was removed with a                            cold snare. Resection and retrieval were complete.                           Normal mucosa was found in the entire colon                            otherwise.                           Non-bleeding non-thrombosed internal hemorrhoids                            were found during retroflexion, during perianal                            exam and during digital exam. The hemorrhoids were                            Grade II (internal hemorrhoids that prolapse but                            reduce  spontaneously). Complications:            No immediate complications. Estimated Blood Loss:     Estimated blood loss was minimal. Impression:               - Hemorrhoids found on digital rectal exam.                           - There was significant looping of the colon.                           - One 5 mm polyp in the descending colon, removed                            with a cold snare. Resected and retrieved.                           - Normal mucosa in the entire examined colon  otherwise.                           - Non-bleeding non-thrombosed internal hemorrhoids. Recommendation:           - The patient will be observed post-procedure,                            until all discharge criteria are met.                           - Discharge patient to home.                           - Patient has a contact number available for                            emergencies. The signs and symptoms of potential                            delayed complications were discussed with the                            patient. Return to normal activities tomorrow.                            Written discharge instructions were provided to the                            patient.                           - High fiber diet.                           - Use FiberCon 1-2 tablets PO daily.                           - Continue present medications.                           - Await pathology results.                           - Repeat colonoscopy in 5-7 years for surveillance                            based on pathology results and history of previous                            precancerous polyps. Consider abdominal binder for                            next procedure.                           - The findings and recommendations were discussed  with the patient.                           - The findings and recommendations were discussed                             with the patient's family. Corliss Parish, MD 10/10/2023 2:28:53 PM

## 2023-10-10 NOTE — Telephone Encounter (Signed)
Called patient on home and cell number to confirm if he was able to assist procedure today. No answer, left message of my call.

## 2023-10-10 NOTE — Progress Notes (Signed)
Pt's states no medical or surgical changes since previsit or office visit.   Broken front tooth upper front and freesyle glu sensor left upper arm reported t C Wall,CRNA

## 2023-10-10 NOTE — Patient Instructions (Signed)

## 2023-10-10 NOTE — Progress Notes (Signed)
GASTROENTEROLOGY PROCEDURE H&P NOTE   Primary Care Physician: Fleet Contras, MD  HPI: Albert Reed is a 73 y.o. male who presents for Colonoscopy for surveillance of previous adenomas.  Past Medical History:  Diagnosis Date   COLONIC POLYPS, HX OF 04/01/2007   DIVERTICULOSIS, COLON 04/01/2007   DM 11/24/2008   HYPERCHOLESTEROLEMIA 07/28/2008   HYPERTENSION 04/01/2007   HYPERTHYROIDISM 11/09/2009   LEUKOPENIA, CHRONIC 07/28/2008   Past Surgical History:  Procedure Laterality Date   BUNIONECTOMY     COLONOSCOPY     Current Outpatient Medications  Medication Sig Dispense Refill   aspirin 81 MG tablet Take 81 mg by mouth daily.     atorvastatin (LIPITOR) 10 MG tablet Take 1 tablet (10 mg total) by mouth daily. 90 tablet 3   Blood Glucose Monitoring Suppl (ONETOUCH VERIO FLEX SYSTEM) w/Device KIT 1 each by Does not apply route 2 (two) times daily. E11.9     brimonidine (ALPHAGAN) 0.2 % ophthalmic solution 1 drop 2 (two) times daily.     Continuous Glucose Sensor (FREESTYLE LIBRE 2 SENSOR) MISC ONE device by mot APPLY route EVERY 14 DAYS 6 each 1   dapagliflozin propanediol (FARXIGA) 10 MG TABS tablet Take 1 tablet (10 mg total) by mouth daily before breakfast. 90 tablet 3   dorzolamide-timolol (COSOPT) 2-0.5 % ophthalmic solution SMARTSIG:In Eye(s)     Insulin Lispro-aabc (LYUMJEV KWIKPEN) 200 UNIT/ML KwikPen Inject under skin 24-30 units in am nd 8-12 units before dinner 30 mL 3   Insulin Pen Needle 32G X 4 MM MISC Use 2x a day 200 each 3   insulin regular (NOVOLIN R) 100 units/mL injection Inject under skin 30 units with breakfast, and 8-12 units with supper. (Patient not taking: Reported on 09/26/2023) 70 mL 3   Insulin Syringe-Needle U-100 (BD INSULIN SYRINGE ULTRAFINE) 31G X 5/16" 0.5 ML MISC 1 each by Does not apply route daily. 100 each 1   Lancets (ONETOUCH DELICA PLUS LANCET33G) MISC 1 each by Other route 2 (two) times daily. E11.9 200 each 0   latanoprost (XALATAN) 0.005 %  ophthalmic solution SMARTSIG:1 Drop(s) In Eye(s) Every Evening     methocarbamol (ROBAXIN) 500 MG tablet Take 1 tablet (500 mg total) by mouth 2 (two) times daily as needed for muscle spasms. 20 tablet 0   Multiple Vitamins-Minerals (CENTRUM SILVER ULTRA MENS) TABS Take 1 tablet by mouth daily.     ondansetron (ZOFRAN ODT) 4 MG disintegrating tablet Take 1 tablet (4 mg total) by mouth every 8 (eight) hours as needed for nausea or vomiting. 21 tablet 0   ONETOUCH VERIO test strip 1 each by Other route in the morning and at bedtime. And lancets 2/day 200 strip 2   Semaglutide, 1 MG/DOSE, (OZEMPIC, 1 MG/DOSE,) 4 MG/3ML SOPN Inject 1 mg into the skin once a week. 9 mL 3   Simethicone 80 MG TABS Take 1 tablet (80 mg total) by mouth 3 (three) times daily as needed. 14 tablet 0   No current facility-administered medications for this visit.    Current Outpatient Medications:    aspirin 81 MG tablet, Take 81 mg by mouth daily., Disp: , Rfl:    atorvastatin (LIPITOR) 10 MG tablet, Take 1 tablet (10 mg total) by mouth daily., Disp: 90 tablet, Rfl: 3   Blood Glucose Monitoring Suppl (ONETOUCH VERIO FLEX SYSTEM) w/Device KIT, 1 each by Does not apply route 2 (two) times daily. E11.9, Disp: , Rfl:    brimonidine (ALPHAGAN) 0.2 % ophthalmic solution,  1 drop 2 (two) times daily., Disp: , Rfl:    Continuous Glucose Sensor (FREESTYLE LIBRE 2 SENSOR) MISC, ONE device by mot APPLY route EVERY 14 DAYS, Disp: 6 each, Rfl: 1   dapagliflozin propanediol (FARXIGA) 10 MG TABS tablet, Take 1 tablet (10 mg total) by mouth daily before breakfast., Disp: 90 tablet, Rfl: 3   dorzolamide-timolol (COSOPT) 2-0.5 % ophthalmic solution, SMARTSIG:In Eye(s), Disp: , Rfl:    Insulin Lispro-aabc (LYUMJEV KWIKPEN) 200 UNIT/ML KwikPen, Inject under skin 24-30 units in am nd 8-12 units before dinner, Disp: 30 mL, Rfl: 3   Insulin Pen Needle 32G X 4 MM MISC, Use 2x a day, Disp: 200 each, Rfl: 3   insulin regular (NOVOLIN R) 100 units/mL  injection, Inject under skin 30 units with breakfast, and 8-12 units with supper. (Patient not taking: Reported on 09/26/2023), Disp: 70 mL, Rfl: 3   Insulin Syringe-Needle U-100 (BD INSULIN SYRINGE ULTRAFINE) 31G X 5/16" 0.5 ML MISC, 1 each by Does not apply route daily., Disp: 100 each, Rfl: 1   Lancets (ONETOUCH DELICA PLUS LANCET33G) MISC, 1 each by Other route 2 (two) times daily. E11.9, Disp: 200 each, Rfl: 0   latanoprost (XALATAN) 0.005 % ophthalmic solution, SMARTSIG:1 Drop(s) In Eye(s) Every Evening, Disp: , Rfl:    methocarbamol (ROBAXIN) 500 MG tablet, Take 1 tablet (500 mg total) by mouth 2 (two) times daily as needed for muscle spasms., Disp: 20 tablet, Rfl: 0   Multiple Vitamins-Minerals (CENTRUM SILVER ULTRA MENS) TABS, Take 1 tablet by mouth daily., Disp: , Rfl:    ondansetron (ZOFRAN ODT) 4 MG disintegrating tablet, Take 1 tablet (4 mg total) by mouth every 8 (eight) hours as needed for nausea or vomiting., Disp: 21 tablet, Rfl: 0   ONETOUCH VERIO test strip, 1 each by Other route in the morning and at bedtime. And lancets 2/day, Disp: 200 strip, Rfl: 2   Semaglutide, 1 MG/DOSE, (OZEMPIC, 1 MG/DOSE,) 4 MG/3ML SOPN, Inject 1 mg into the skin once a week., Disp: 9 mL, Rfl: 3   Simethicone 80 MG TABS, Take 1 tablet (80 mg total) by mouth 3 (three) times daily as needed., Disp: 14 tablet, Rfl: 0 No Known Allergies Family History  Problem Relation Age of Onset   Diabetes Brother        Type 2   Esophageal cancer Neg Hx    Stomach cancer Neg Hx    Rectal cancer Neg Hx    Colon polyps Neg Hx    Colon cancer Neg Hx        patient dose not know much about family   Social History   Socioeconomic History   Marital status: Married    Spouse name: Not on file   Number of children: Not on file   Years of education: Not on file   Highest education level: Not on file  Occupational History   Occupation: WORKS SECOND SHIFT    Employer: [employer]    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  Tobacco Use    Smoking status: Never   Smokeless tobacco: Never  Substance and Sexual Activity   Alcohol use: No    Alcohol/week: 0.0 standard drinks of alcohol   Drug use: No   Sexual activity: Not on file  Other Topics Concern   Not on file  Social History Narrative   Originally from [place] (in [place] since 1996)   Social Drivers of Corporate investment banker Strain: Not on [employer] Insecurity: Not on file  Transportation Needs: Not on file  Physical Activity: Not on file  Stress: Not on file  Social Connections: Not on file  Intimate Partner Violence: Not on file    Physical Exam: There were no vitals filed for this visit. There is no height or weight on file to calculate BMI. GEN: NAD EYE: Sclerae anicteric ENT: MMM CV: Non-tachycardic GI: Soft, NT/ND NEURO:  Alert & Oriented x 3  Lab Results: No results for input(s): "WBC", "HGB", "HCT", "PLT" in the last 72 hours. BMET No results for input(s): "NA", "K", "CL", "CO2", "GLUCOSE", "BUN", "CREATININE", "CALCIUM" in the last 72 hours. LFT No results for input(s): "PROT", "ALBUMIN", "AST", "ALT", "ALKPHOS", "BILITOT", "BILIDIR", "IBILI" in the last 72 hours. PT/INR No results for input(s): "LABPROT", "INR" in the last 72 hours.   Impression / Plan: This is a 73 y.o.male who presents for Colonoscopy for surveillance of previous adenomas.  The risks and benefits of endoscopic evaluation/treatment were discussed with the patient and/or family; these include but are not limited to the risk of perforation, infection, bleeding, missed lesions, lack of diagnosis, severe illness requiring hospitalization, as well as anesthesia and sedation related illnesses.  The patient's history has been reviewed, patient examined, no change in status, and deemed stable for procedure.  The patient and/or family is agreeable to proceed.    Corliss Parish, MD [facility] Office # [phone]

## 2023-10-10 NOTE — Progress Notes (Signed)
 Report given to PACU, vss

## 2023-10-10 NOTE — Progress Notes (Signed)
 Called to room to assist during endoscopic procedure.  Patient ID and intended procedure confirmed with present staff. Received instructions for my participation in the procedure from the performing physician.

## 2023-10-13 ENCOUNTER — Telehealth: Payer: Self-pay | Admitting: *Deleted

## 2023-10-13 NOTE — Telephone Encounter (Signed)
  Follow up Call-     10/10/2023    1:45 PM  Call back number  Post procedure Call Back phone  # [phone]  Permission to leave phone message Yes     Patient questions:  Do you have a fever, pain , or abdominal swelling? No. Pain Score  0 *  Have you tolerated food without any problems? Yes.    Have you been able to return to your normal activities? Yes.    Do you have any questions about your discharge instructions: Diet   No. Medications  No. Follow up visit  No.  Do you have questions or concerns about your Care? No.  Actions: * If pain score is 4 or above: No action needed, pain <4.

## 2023-10-15 LAB — SURGICAL PATHOLOGY

## 2023-10-16 ENCOUNTER — Encounter: Payer: Self-pay | Admitting: Gastroenterology

## 2023-10-24 ENCOUNTER — Other Ambulatory Visit: Payer: Self-pay | Admitting: Internal Medicine

## 2023-10-24 DIAGNOSIS — Z794 Long term (current) use of insulin: Secondary | ICD-10-CM

## 2023-11-12 ENCOUNTER — Ambulatory Visit: Payer: PPO | Admitting: Internal Medicine

## 2023-11-12 ENCOUNTER — Encounter: Payer: Self-pay | Admitting: Internal Medicine

## 2023-11-12 VITALS — BP 122/70 | HR 80 | Ht 64.0 in | Wt 172.8 lb

## 2023-11-12 DIAGNOSIS — E785 Hyperlipidemia, unspecified: Secondary | ICD-10-CM

## 2023-11-12 DIAGNOSIS — E113512 Type 2 diabetes mellitus with proliferative diabetic retinopathy with macular edema, left eye: Secondary | ICD-10-CM

## 2023-11-12 DIAGNOSIS — Z794 Long term (current) use of insulin: Secondary | ICD-10-CM

## 2023-11-12 DIAGNOSIS — E1169 Type 2 diabetes mellitus with other specified complication: Secondary | ICD-10-CM | POA: Diagnosis not present

## 2023-11-12 LAB — POCT GLYCOSYLATED HEMOGLOBIN (HGB A1C): Hemoglobin A1C: 8.4 % — AB (ref 4.0–5.6)

## 2023-11-12 NOTE — Patient Instructions (Addendum)
 Please continue: - Farxiga 10 mg daily in am - Ozempic 2 mg weekly  Change: - Lyumjev: 14-18 units before b'fast  14-18 units before dinner  Please return in 3-4 months.

## 2023-11-12 NOTE — Progress Notes (Signed)
 Patient ID: Albert Reed, male   DOB: 1951/03/25, 73 y.o.   MRN: [id number]  HPI: Albert Reed is a 73 y.o.-year-old male, returning for follow-up for DM2, dx in 1997, insulin-dependent since 2013, uncontrolled, with complications (DR). Pt. previously saw Dr. Everardo All, but last visit with me 73 months ago.  Interim history: No increased urination, blurry vision, nausea, chest pain. He has pain in L knee at waking up >> resolves after walking.  Reviewed HbA1c: Lab Results  Component Value Date   HGBA1C 7.4 (A) 06/25/2023   HGBA1C 8.1 02/19/2023   HGBA1C 7.4 (A) 10/02/2022   HGBA1C 7.3 (A) 07/01/2022   HGBA1C 7.8 (H) 04/24/2022   HGBA1C 7.8 (A) 11/21/2021   HGBA1C 7.0 (A) 07/18/2021   HGBA1C 8.0 (A) 12/13/2020   HGBA1C 7.5 (A) 09/13/2020   HGBA1C 8.5 (A) 07/05/2020   Pt is on a regimen of: - Farxiga 10 mg in am - 1.5 >> 3 >> 4.5 mg weekly >> Ozempic 0.25 >> 0.5 >> 1 mg >> 2 mg weekly  -   30 minutes before meals >> Lyumjev insulin: 24-30 >> 25 units before b'fast (in the last month he has been taking this before dinner) 8-12 >> 8-10 units before dinner (in the last month he has been taking his before breakfast) She had nausea and vomiting with higher doses of Trulicity. Prev. On Invokana.  Pt checks his sugars 4x a day and they are (with receiver):  Previously:  Previously:   Lowest sugar was  50s >> 69 >> 62 >> 60s; he has hypoglycemia awareness at 70.  Highest sugar was  300 >> 200s >> 300s.  Glucometer: One Touch Verio flex  - no CKD, last BUN/creatinine:  Lab Results  Component Value Date   BUN 19 04/09/2023   BUN 23 12/10/2022   CREATININE 1.17 04/09/2023   CREATININE 1.50 (H) 12/10/2022   Lab Results  Component Value Date   MICRALBCREAT 1.7 06/25/2023   MICRALBCREAT 0.8 04/24/2017   MICRALBCREAT 1.2 03/08/2016   MICRALBCREAT 0.7 12/08/2015   MICRALBCREAT 1.3 03/30/2014   MICRALBCREAT 1.5 04/21/2013   MICRALBCREAT 0.4 04/23/2012   MICRALBCREAT 14.7  11/09/2009   MICRALBCREAT 18.8 07/28/2008   MICRALBCREAT 6.9 10/22/2006  On Farxiga.  -+ HL; last set of lipids: Lab Results  Component Value Date   CHOL 88 04/09/2023   HDL 34 (L) 04/09/2023   LDLCALC 27 04/09/2023   LDLDIRECT 76.0 04/24/2017   TRIG 207 (H) 04/09/2023   CHOLHDL 2.6 04/09/2023  On Lipitor 10 mg daily.  - last eye exam was 01/2023: + DR - Dr Albert Reed Prev. 01/23/2022-Dr. 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  Last foot exam 06/25/2023.  She also has a h/o HTN, diverticulosis.    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    Employer: [employer]    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  Tobacco Use   Smoking status: Never   Smokeless tobacco: Never  Substance and Sexual Activity   Alcohol use: No    Alcohol/week: 0.0 standard drinks of alcohol   Drug use: No   Sexual activity: Not on file  Other Topics Concern   Not on file  Social History Narrative   Originally from [place] (in [place] since 1996)   Social Drivers of Corporate investment banker Strain: Not on [employer]  Insecurity: Not on file  Transportation Needs: Not on file  Physical Activity: Not on file  Stress: Not on file  Social Connections: Not on file  Intimate Partner Violence: Not on file   Current Outpatient Medications on File Prior to Visit  Medication Sig Dispense Refill   aspirin 81 MG tablet Take 81 mg by mouth daily.     atorvastatin (LIPITOR) 10 MG tablet Take 1 tablet (10 mg total) by mouth daily. 90 tablet 3   Blood Glucose Monitoring Suppl (ONETOUCH VERIO FLEX SYSTEM) w/Device KIT 1 each by Does not apply route 2 (two) times daily. E11.9     brimonidine (ALPHAGAN) 0.2 % ophthalmic solution 1 drop 2 (two) times daily.     Continuous Glucose Sensor (FREESTYLE LIBRE 2 SENSOR) MISC one device by not APPLY route EVERY 14 DAYS 6 each 3   dapagliflozin propanediol (FARXIGA) 10 MG TABS tablet Take 1 tablet (10 mg total) by mouth daily before breakfast. 90 tablet 3   dorzolamide-timolol (COSOPT) 2-0.5 % ophthalmic solution SMARTSIG:In Eye(s)     Insulin Lispro-aabc (LYUMJEV KWIKPEN) 200 UNIT/ML KwikPen Inject under skin 24-30 units in am nd 8-12 units before dinner 30 mL 3   Insulin Pen Needle 32G X 4 MM MISC Use 2x a day 200 each 3   insulin regular (NOVOLIN R) 100 units/mL injection Inject under skin 30 units with breakfast, and 8-12 units with supper. (Patient not taking: Reported on 09/26/2023) 70 mL 3   Insulin Syringe-Needle U-100 (BD INSULIN SYRINGE ULTRAFINE) 31G X 5/16" 0.5 ML MISC 1 each by Does not apply route daily. 100 each 1   Lancets (ONETOUCH DELICA PLUS LANCET33G) MISC 1 each by Other route 2 (two) times daily. E11.9 200 each 0   latanoprost (XALATAN) 0.005 % ophthalmic solution SMARTSIG:1 Drop(s) In Eye(s) Every Evening     losartan (COZAAR) 25 MG tablet Take 25 mg by mouth daily.     methocarbamol (ROBAXIN) 500 MG tablet Take 1 tablet (500 mg total) by mouth 2 (two) times daily as needed for muscle spasms. 20 tablet 0   Multiple Vitamins-Minerals (CENTRUM SILVER ULTRA MENS)  TABS Take 1 tablet by mouth daily.     ondansetron (ZOFRAN ODT) 4 MG disintegrating tablet Take 1 tablet (4 mg total) by mouth every 8 (eight) hours as needed for nausea or vomiting. 21 tablet 0   ONETOUCH VERIO test strip 1 each by Other route in the morning and at bedtime. And lancets 2/day 200 strip 2   Semaglutide, 1 MG/DOSE, (OZEMPIC, 1 MG/DOSE,) 4 MG/3ML SOPN Inject 1 mg into the skin once a week. 9 mL 3   Simethicone 80 MG TABS Take 1 tablet (80 mg total) by mouth 3 (three)  times daily as needed. 14 tablet 0   Current Facility-Administered Medications on File Prior to Visit  Medication Dose Route Frequency Provider Last Rate Last Admin   0.9 %  sodium chloride infusion  500 mL Intravenous Once Mansouraty, Netty Starring., MD       No Known Allergies Family History  Problem Relation Age of Onset   Diabetes Brother        Type 2   Esophageal cancer Neg Hx    Stomach cancer Neg Hx    Rectal cancer Neg Hx    Colon polyps Neg Hx    Colon cancer Neg Hx        patient dose not know much about family   PE: BP 122/70   Pulse 80   Ht 5\' 4"  (1.626 m)   Wt 172 lb 12.8 oz (78.4 kg)   SpO2 96%   BMI 29.66 kg/m  Wt Readings from Last 3 Encounters:  11/12/23 172 lb 12.8 oz (78.4 kg)  10/10/23 180 lb (81.6 kg)  09/26/23 180 lb (81.6 kg)   Constitutional: Slightly overweight, in NAD Eyes:  EOMI, no exophthalmos ENT: no neck masses, no cervical lymphadenopathy Cardiovascular: RRR, No MRG Respiratory: CTA B Musculoskeletal: no deformities Skin:no rashes Neurological: no tremor with outstretched hands  ASSESSMENT: 1. DM2, insulin-dependent, uncontrolled, with complications - DR  2. HL  PLAN:  1. Patient with longstanding, uncontrolled, type 2 diabetes, on oral antidiabetic regimen with SGLT2 inhibitor and also weekly GLP-1 receptor agonist and regular insulin with slightly better control at last visit.  At that time, HbA1c decreased from 8.1% to 7.4%.  Reviewing the CGM trends,  sugars appears to be improved from before, fluctuating within the target range but significantly higher after breakfast and with a milder increase after lunch.  Upon questioning, he was occasionally forgetting to take his insulin dose 30 minutes before meals and was taking this when the sugars were already high after meals.  We discussed that this was conducive to low blood sugars which he was seeing between 12 PM and 4 PM.  I advised him to try to switch from regular insulin vials to Lyumjev pens, injected right before meals.  We did discuss about using a lower dose of insulin for smaller meals and to use a lower dose of insulin if he has to take the insulin after he already ate. -After changing from Trulicity to Ozempic, he felt better and lost weight CGM interpretation: -At today's visit, we reviewed his CGM downloads: It appears that 42% of values are in target range (goal >70%), while 57% are higher than 180 (goal <25%), and 1% are lower than 70 (goal <4%).  The calculated average blood sugar is 190.  The projected HbA1c for the next 3 months (GMI) is 7.9%. -Reviewing the CGM trends, sugars appear to be much worse compared to before.  I reviewed the CGM tracings with the patient and explained that the sugars are increasing abruptly after dinner and then decreased precipitously with a nadir around 3 AM.  Upon questioning, he is now observing a religious fast for the month of March and he is taking the highest dose of Lyumjev at night approximately 25 units, but after, rather than before dinner.  He is taking only 8 to 10 units for breakfast.  He is fasting during the rest of the day. -Asked about the absolute importance of taking Lyumjev before the meals or if he remembers to take it while eating.  However, if he  remembers it only after the meal, I advised him either to skip it completely or to take a lower dose to avoid a precipitous dose in blood sugars afterwards.  Will also go ahead and decrease the  dose of insulin before his dinner and increasing slightly before his breakfast.  I still do not feel that he needs long-acting insulin for now.  We continued the Ozempic and Farxiga. - I suggested to:  Patient Instructions  Please continue: - Farxiga 10 mg daily in am - Ozempic 2 mg weekly  Change: - Lyumjev: 14-18 units before b'fast  14-18 units before dinner  Please return in 3-4 months.  - we checked his HbA1c: 8.4% (higher) - advised to check sugars at different times of the day - 4x a day, rotating check times - advised for yearly eye exams >> he is UTD - return to clinic in 3-4 months  2. HL -Latest lipid panel was reviewed from 2024: LDL at goal, HDL slightly low, triglycerides elevated: Lab Results  Component Value Date   CHOL 88 04/09/2023   HDL 34 (L) 04/09/2023   LDLCALC 27 04/09/2023   LDLDIRECT 76.0 04/24/2017   TRIG 207 (H) 04/09/2023   CHOLHDL 2.6 04/09/2023  -He continues Lipitor 10 mg daily without side effects  Carlus Pavlov, MD PhD [facility] Endocrinology

## 2024-02-18 ENCOUNTER — Ambulatory Visit: Admitting: Internal Medicine

## 2024-02-18 ENCOUNTER — Encounter: Payer: Self-pay | Admitting: Internal Medicine

## 2024-02-18 VITALS — BP 120/70 | HR 96 | Ht 64.0 in | Wt 169.2 lb

## 2024-02-18 DIAGNOSIS — E113512 Type 2 diabetes mellitus with proliferative diabetic retinopathy with macular edema, left eye: Secondary | ICD-10-CM

## 2024-02-18 DIAGNOSIS — E785 Hyperlipidemia, unspecified: Secondary | ICD-10-CM | POA: Diagnosis not present

## 2024-02-18 DIAGNOSIS — Z794 Long term (current) use of insulin: Secondary | ICD-10-CM | POA: Diagnosis not present

## 2024-02-18 DIAGNOSIS — E1169 Type 2 diabetes mellitus with other specified complication: Secondary | ICD-10-CM

## 2024-02-18 LAB — POCT GLYCOSYLATED HEMOGLOBIN (HGB A1C): Hemoglobin A1C: 8.3 % — AB (ref 4.0–5.6)

## 2024-02-18 NOTE — Progress Notes (Signed)
 Patient ID: Albert Reed, male   DOB: 1951-01-14, 73 y.o.   MRN: [id number]  HPI: Albert Reed is a 73 y.o.-year-old male, returning for follow-up for DM2, dx in 1997, insulin -dependent since 2013, uncontrolled, with complications (DR). Pt. previously saw Dr. Kassie, but last visit with me 4 months ago.  Interim history: No increased urination, blurry vision, nausea, chest pain.  He had left knee pain at last visit but this resolved.  Reviewed HbA1c: Lab Results  Component Value Date   HGBA1C 8.4 (A) 11/12/2023   HGBA1C 7.4 (A) 06/25/2023   HGBA1C 8.1 02/19/2023   HGBA1C 7.4 (A) 10/02/2022   HGBA1C 7.3 (A) 07/01/2022   HGBA1C 7.8 (H) 04/24/2022   HGBA1C 7.8 (A) 11/21/2021   HGBA1C 7.0 (A) 07/18/2021   HGBA1C 8.0 (A) 12/13/2020   HGBA1C 7.5 (A) 09/13/2020   Pt is on a regimen of: - Farxiga  10 mg in am - 1.5 >> 3 >> 4.5 mg weekly >> Ozempic  0.25 >> 0.5 >> 1 mg >> 2 mg weekly  -   30 minutes before meals >> Lyumjev  insulin : 24-30 >> 25 units before b'fast >> 14-18 units before b'fast  8-12 >> 8-10 units before dinner >> (was taking it after dinner) >> 14-18 units before dinner She had nausea and vomiting with higher doses of Trulicity . Prev. On Invokana .  Pt checks his sugars 4x a day and they are (with receiver):  Previously:  Previously:   Lowest sugar was  50s >> 69 >> 62 >> 60s >>> 68; he has hypoglycemia awareness at 70.  Highest sugar was  300 >> 200s >> 300s >> 350s  Glucometer: One Touch Verio flex  - no CKD, last BUN/creatinine:  Lab Results  Component Value Date   BUN 19 04/09/2023   BUN 23 12/10/2022   CREATININE 1.17 04/09/2023   CREATININE 1.50 (H) 12/10/2022   Lab Results  Component Value Date   MICRALBCREAT 14.7 11/09/2009   MICRALBCREAT 18.8 07/28/2008   MICRALBCREAT 6.9 10/22/2006  On Farxiga .  -+ HL; last set of lipids: Lab Results  Component Value Date   CHOL 88 04/09/2023   HDL 34 (L) 04/09/2023   LDLCALC 27 04/09/2023   LDLDIRECT  76.0 04/24/2017   TRIG 207 (H) 04/09/2023   CHOLHDL 2.6 04/09/2023  On Lipitor 10 mg daily.  - last eye exam was 01/2023: + DR - Dr Eileen Coming up in 03/2024. Prev. 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  Last foot exam 06/25/2023.  She also has a h/o HTN, diverticulosis.    ROS: + see HPI  Past Medical History:  Diagnosis Date   COLONIC POLYPS, HX OF 04/01/2007   DIVERTICULOSIS, COLON 04/01/2007   DM 11/24/2008   HYPERCHOLESTEROLEMIA 07/28/2008   HYPERTENSION 04/01/2007   HYPERTHYROIDISM 11/09/2009   LEUKOPENIA, CHRONIC 07/28/2008   Past Surgical History:  Procedure Laterality Date   BUNIONECTOMY     cataract surgery     COLONOSCOPY     Social History   Socioeconomic History   Marital status: Married    Spouse name: Not on file   Number of children: Not on file   Years of education: Not on file   Highest education level: Not on file  Occupational  History   Occupation: WORKS SECOND SHIFT    Employer: [employer]    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  Tobacco Use   Smoking status: Never   Smokeless tobacco: Never  Substance and Sexual Activity   Alcohol use: No    Alcohol/week: 0.0 standard drinks of alcohol   Drug use: No   Sexual activity: Not on file  Other Topics Concern   Not on file  Social History Narrative   Originally from [place]   Social Drivers of Corporate investment banker Strain: Not on [employer] Insecurity: Not on file  Transportation Needs: Not on file  Physical Activity: Not on file  Stress: Not on file  Social Connections: Not on file  Intimate Partner Violence: Not on file   Current Outpatient Medications  on File Prior to Visit  Medication Sig Dispense Refill   aspirin 81 MG tablet Take 81 mg by mouth daily.     atorvastatin  (LIPITOR) 10 MG tablet Take 1 tablet (10 mg total) by mouth daily. 90 tablet 3   Blood Glucose Monitoring Suppl (ONETOUCH VERIO FLEX SYSTEM) w/Device KIT 1 each by Does not apply route 2 (two) times daily. E11.9     brimonidine (ALPHAGAN) 0.2 % ophthalmic solution 1 drop 2 (two) times daily.     Continuous Glucose Sensor (FREESTYLE LIBRE 2 SENSOR) MISC one device by not APPLY route EVERY 14 DAYS 6 each 3   dapagliflozin  propanediol (FARXIGA ) 10 MG TABS tablet Take 1 tablet (10 mg total) by mouth daily before breakfast. 90 tablet 3   dorzolamide-timolol (COSOPT) 2-0.5 % ophthalmic solution SMARTSIG:In Eye(s)     Insulin  Lispro-aabc (LYUMJEV  KWIKPEN) 200 UNIT/ML KwikPen Inject under skin 24-30 units in am nd 8-12 units before dinner 30 mL 3   Insulin  Pen Needle 32G X 4 MM MISC Use 2x a day 200 each 3   Insulin  Syringe-Needle U-100 (BD INSULIN  SYRINGE ULTRAFINE) 31G X 5/16 0.5 ML MISC 1 each by Does not apply route daily. 100 each 1   Lancets (ONETOUCH DELICA PLUS LANCET33G) MISC 1 each by Other route 2 (two) times daily. E11.9 200 each 0   latanoprost (XALATAN) 0.005 % ophthalmic solution SMARTSIG:1 Drop(s) In Eye(s) Every Evening     losartan  (COZAAR ) 25 MG tablet Take 25 mg by mouth daily.     methocarbamol  (ROBAXIN ) 500 MG tablet Take 1 tablet (500 mg total) by mouth 2 (two) times daily as needed for muscle spasms. 20 tablet 0   Multiple Vitamins-Minerals (CENTRUM SILVER ULTRA MENS) TABS Take 1 tablet by mouth daily.     ondansetron  (ZOFRAN  ODT) 4 MG disintegrating tablet Take 1 tablet (4 mg total) by mouth every 8 (eight) hours as needed for nausea or vomiting. 21 tablet 0   ONETOUCH VERIO test strip 1 each by Other route in the morning and at bedtime. And lancets 2/day 200 strip 2   Semaglutide , 2 MG/DOSE, (OZEMPIC , 2 MG/DOSE,) 8 MG/3ML SOPN Inject 2 mg into the skin.      Simethicone  80 MG TABS Take 1 tablet (80 mg total) by mouth 3 (three) times daily as needed. 14 tablet 0   Current Facility-Administered Medications on File Prior to Visit  Medication Dose Route Frequency Provider Last Rate Last Admin   0.9 %  sodium chloride  infusion  500 mL Intravenous Once Mansouraty, Gabriel Jr., MD       No Known Allergies Family History  Problem Relation Age of Onset   Diabetes Brother  Type 2   Esophageal cancer Neg Hx    Stomach cancer Neg Hx    Rectal cancer Neg Hx    Colon polyps Neg Hx    Colon cancer Neg Hx        patient dose not know much about family   PE: BP 120/70   Pulse 96   Ht 5' 4 (1.626 m)   Wt 169 lb 3.2 oz (76.7 kg)   SpO2 99%   BMI 29.04 kg/m  Wt Readings from Last 3 Encounters:  02/18/24 169 lb 3.2 oz (76.7 kg)  11/12/23 172 lb 12.8 oz (78.4 kg)  10/10/23 180 lb (81.6 kg)   Constitutional: Slightly overweight, in NAD Eyes:  EOMI, no exophthalmos ENT: no neck masses, no cervical lymphadenopathy Cardiovascular: RRR, No MRG Respiratory: CTA B Musculoskeletal: no deformities Skin:no rashes Neurological: no tremor with outstretched hands  ASSESSMENT: 1. DM2, insulin -dependent, uncontrolled, with complications - DR  2. HL  PLAN:  1. Patient with longstanding, uncontrolled, type 2 diabetes, on oral antidiabetic regimen with SGLT2 inhibitor, also weekly GLP-1 receptor agonist and ultra rapid acting insulin , which we adjusted at last visit.  He felt better after switching from Trulicity  to Ozempic , and also lost weight.  At last visit, however, sugars were much worse, increasing abruptly after dinner and then decreasing precipitously with a nadir around 3 AM.  Upon questioning, he was observing a religious fast for the month of March and he was taking the highest dose of Lyumjev  at night, approximately 25 units, but after, rather than before dinner.  He was only taking 8 to 10 units for breakfast.  He was fasting during the rest  of the day.  We discussed about the absolute importance of taking Lyumjev  only before meals or, if he remembers to take it, while eating.  If he remembered only after the meal, I advised him to either skip it completely or to take a lower dose to avoid a precipitous dosing blood sugars afterwards.  We did decrease his Lyumjev  dose with dinner and increase the dose in the morning.  We continued Ozempic  and Farxiga .  HbA1c was higher, at 8.4%. CGM interpretation: -At today's visit, we reviewed his CGM downloads: It appears that 68% of values are in target range (goal >70%), while 32% are higher than 180 (goal <25%), and 0% are lower than 70 (goal <4%).  The calculated average blood sugar is 164.  The projected HbA1c for the next 3 months (GMI) is 7.2%. -Reviewing the CGM trends, sugars appear to have improved since last visit, with less lows, and less variability.  He does have high blood sugars after breakfast and upon questioning, some of his breakfast are actually cereals, which I advised him to replace with healthier options (for example yogurt with nuts and berries).  Later in the day, he also may have high blood sugars after dinner, but the blood sugars in the evening improved in the last week. -At today's visit, I did not suggest a change in regimen particularly since the blood sugars in the last week have improved significantly. - I suggested to:  Patient Instructions  Please continue: - Farxiga  10 mg daily in am - Ozempic  2 mg weekly - Lyumjev : 14-18 units before b'fast  14-18 units before dinner  Please return in 3-4 months.  - we checked his HbA1c: 8.3% (only slightly lower) - advised to check sugars at different times of the day - 4x a day, rotating check times - advised for  yearly eye exams >> he is UTD - wil check an ACR todayl - return to clinic in 3-4 months  2. HL - Latest lipid panel was reviewed from last year: LDL at goal, HDL slightly low, triglycerides elevated: Lab  Results  Component Value Date   CHOL 88 04/09/2023   HDL 34 (L) 04/09/2023   LDLCALC 27 04/09/2023   LDLDIRECT 76.0 04/24/2017   TRIG 207 (H) 04/09/2023   CHOLHDL 2.6 04/09/2023  -He continues Lipitor 10 mg daily without side effects  Lela Fendt, MD PhD [facility]

## 2024-02-18 NOTE — Addendum Note (Signed)
 Addended by: CLEOTILDE ROLIN RAMAN on: 02/18/2024 11:53 AM   Modules accepted: Orders

## 2024-02-18 NOTE — Patient Instructions (Addendum)
 Please continue: - Farxiga  10 mg daily in am - Ozempic  2 mg weekly - Lyumjev : 14-18 units before b'fast  14-18 units before dinner  Please return in 3-4 months (before 06/25/2023).

## 2024-02-19 ENCOUNTER — Ambulatory Visit: Payer: Self-pay | Admitting: Internal Medicine

## 2024-02-19 LAB — MICROALBUMIN / CREATININE URINE RATIO
Creatinine, Urine: 40 mg/dL (ref 20–320)
Microalb Creat Ratio: 5 mg/g{creat} (ref <30)
Microalb, Ur: 0.2 mg/dL

## 2024-05-12 ENCOUNTER — Other Ambulatory Visit: Payer: Self-pay

## 2024-06-23 ENCOUNTER — Encounter: Payer: Self-pay | Admitting: Internal Medicine

## 2024-06-23 ENCOUNTER — Ambulatory Visit: Admitting: Internal Medicine

## 2024-06-23 VITALS — BP 120/70 | HR 87 | Ht 64.0 in | Wt 168.4 lb

## 2024-06-23 DIAGNOSIS — E785 Hyperlipidemia, unspecified: Secondary | ICD-10-CM

## 2024-06-23 DIAGNOSIS — Z794 Long term (current) use of insulin: Secondary | ICD-10-CM

## 2024-06-23 DIAGNOSIS — E113512 Type 2 diabetes mellitus with proliferative diabetic retinopathy with macular edema, left eye: Secondary | ICD-10-CM

## 2024-06-23 DIAGNOSIS — E1169 Type 2 diabetes mellitus with other specified complication: Secondary | ICD-10-CM

## 2024-06-23 LAB — POCT GLYCOSYLATED HEMOGLOBIN (HGB A1C): Hemoglobin A1C: 7.2 % — AB (ref 4.0–5.6)

## 2024-06-23 MED ORDER — LYUMJEV KWIKPEN 200 UNIT/ML ~~LOC~~ SOPN: PEN_INJECTOR | SUBCUTANEOUS | 3 refills | Status: AC

## 2024-06-23 MED ORDER — FREESTYLE LIBRE 3 PLUS SENSOR MISC: 1.0000 | 3 refills | Status: AC

## 2024-06-23 MED ORDER — OZEMPIC (2 MG/DOSE) 8 MG/3ML ~~LOC~~ SOPN: 2.0000 mg | PEN_INJECTOR | SUBCUTANEOUS | 3 refills | Status: AC

## 2024-06-23 NOTE — Progress Notes (Signed)
 Patient ID: Albert Reed, male   DOB: 1950/11/17, 73 y.o.   MRN: [id number]  HPI: Albert Reed is a 73 y.o.-year-old male, returning for follow-up for DM2, dx in 1997, insulin -dependent since 2013, uncontrolled, with complications (Albert). Pt. previously saw Albert. Kassie, but last visit with me 4 months ago.  Interim history: No increased urination, blurry vision, chest pain. He had nausea after being off Ozempic  in [place] and he restarted it, so he reduced the dose afterwards.  He is now feeling well, without GI side effects. He recently had labs with PCP Albert Reed) in 04/2024.  Reviewed HbA1c: Lab Results  Component Value Date   HGBA1C 8.3 (A) 02/18/2024   HGBA1C 8.4 (A) 11/12/2023   HGBA1C 7.4 (A) 06/25/2023   HGBA1C 8.1 02/19/2023   HGBA1C 7.4 (A) 10/02/2022   HGBA1C 7.3 (A) 07/01/2022   HGBA1C 7.8 (H) 04/24/2022   HGBA1C 7.8 (A) 11/21/2021   HGBA1C 7.0 (A) 07/18/2021   HGBA1C 8.0 (A) 12/13/2020   Pt is on a regimen of: - Farxiga  10 mg in am - 1.5 >> 3 >> 4.5 mg weekly >> Ozempic  0.25 >> 0.5 >> 1 mg >> 2 mg weekly >> off >> 1 mg  -   30 minutes before meals >> Lyumjev  insulin : 24-30 >> 25 units before b'fast >> 14-18 >> 20 units before b'fast  8-12 >> 8-10 units before dinner >> (was taking it after dinner) >> 14-18 >> 20 units before dinner She had nausea and vomiting with higher doses of Trulicity . Prev. On Invokana .  Pt checks his sugars occasionally - off the sensor now, as he had several defective sensors: - am: 105-171 - dinner: 121-166, 180 - 2h after dinner: 195  Previously:  Previously:  Lowest sugar was  50s >> 60 .Albert Reed.. 68 >> 100; he has hypoglycemia awareness at 70.  Highest sugar was  300s >> 350s >> 195  Glucometer: One Touch Verio flex  - no CKD, last BUN/creatinine:  Lab Results  Component Value Date   BUN 19 04/09/2023   BUN 23 12/10/2022   CREATININE 1.17 04/09/2023   CREATININE 1.50 (H) 12/10/2022   Lab Results  Component Value Date    MICRALBCREAT 5 02/18/2024   MICRALBCREAT 14.7 11/09/2009   MICRALBCREAT 18.8 07/28/2008   MICRALBCREAT 6.9 10/22/2006  On Farxiga .  -+ HL; last set of lipids: Lab Results  Component Value Date   CHOL 88 04/09/2023   HDL 34 (L) 04/09/2023   LDLCALC 27 04/09/2023   LDLDIRECT 76.0 04/24/2017   TRIG 207 (H) 04/09/2023   CHOLHDL 2.6 04/09/2023  On Lipitor 10 mg daily.  - last eye exam was 04/2024: + Albert reportedly - Albert Reed. Prev. 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  Last foot exam 06/25/2023.  She also has a h/o HTN, diverticulosis.    ROS: + see HPI  Past Reed History:  Diagnosis Date   COLONIC POLYPS, HX OF 04/01/2007   DIVERTICULOSIS, COLON 04/01/2007   DM 11/24/2008   HYPERCHOLESTEROLEMIA 07/28/2008   HYPERTENSION 04/01/2007   HYPERTHYROIDISM 11/09/2009   LEUKOPENIA, CHRONIC 07/28/2008   Past Surgical History:  Procedure Laterality Date   BUNIONECTOMY     cataract surgery     COLONOSCOPY  Social History   Socioeconomic History   Marital status: Married    Spouse name: Not on file   Number of children: Not on file   Years of education: Not on file   Highest education level: Not on file  Occupational History   Occupation: WORKS SECOND SHIFT    Employer: [employer]    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  Tobacco Use   Smoking status: Never   Smokeless tobacco: Never  Substance and Sexual Activity   Alcohol use: No    Alcohol/week: 0.0 standard drinks of alcohol   Drug use: No   Sexual activity: Not on file  Other Topics Concern   Not on file  Social History Narrative   Originally from [place]   Social Drivers of Manufacturing Engineer Strain: Not on [employer] Insecurity: Not on file  Transportation Needs: Not on file  Physical Activity: Not on file  Stress: Not on file  Social Connections: Not on file  Intimate Partner Violence: Not on file   Current Outpatient Medications on File Prior to Visit  Medication Sig Dispense Refill   aspirin 81 MG tablet Take 81 mg by mouth daily.     atorvastatin  (LIPITOR) 10 MG tablet Take 1 tablet (10 mg total) by mouth daily. 90 tablet 3   Blood Glucose Monitoring Suppl (ONETOUCH VERIO FLEX SYSTEM) w/Device KIT 1 each by Does not apply route 2 (two) times daily. E11.9     brimonidine (ALPHAGAN) 0.2 % ophthalmic solution 1 drop 2 (two) times daily.     Continuous Glucose Sensor (FREESTYLE LIBRE 2 SENSOR) MISC one device by not APPLY route EVERY 14 DAYS 6 each 3   dapagliflozin  propanediol (FARXIGA ) 10 MG TABS tablet Take 1 tablet (10 mg total) by mouth daily before breakfast. 90 tablet 3   dorzolamide-timolol (COSOPT) 2-0.5 % ophthalmic solution SMARTSIG:In Eye(s)     Insulin  Lispro-aabc (LYUMJEV  KWIKPEN) 200 UNIT/ML KwikPen Inject under skin 24-30 units in am nd 8-12 units before dinner 30 mL 3   Insulin  Pen Needle 32G X 4 MM MISC Use 2x a day 200 each 3   Insulin  Syringe-Needle U-100 (BD INSULIN  SYRINGE ULTRAFINE) 31G X 5/16 0.5 ML MISC 1 each by Does not apply route daily. 100 each 1   Lancets (ONETOUCH DELICA PLUS LANCET33G) MISC 1 each by Other route 2 (two) times daily. E11.9 200 each 0   latanoprost (XALATAN) 0.005 % ophthalmic solution SMARTSIG:1 Drop(s) In Eye(s) Every Evening     losartan  (COZAAR ) 25 MG tablet Take 25 mg by mouth daily.     methocarbamol  (ROBAXIN ) 500 MG tablet Take 1 tablet (500 mg total) by mouth 2 (two) times daily as needed for muscle spasms. 20 tablet 0   Multiple Vitamins-Minerals (CENTRUM SILVER ULTRA MENS) TABS Take 1 tablet by mouth daily.     ondansetron  (ZOFRAN  ODT) 4 MG disintegrating tablet Take 1 tablet (4 mg total) by mouth  every 8 (eight) hours as needed for nausea or vomiting. 21 tablet 0   ONETOUCH VERIO test strip 1 each by Other route in the morning and at bedtime. And lancets 2/day 200 strip 2   Semaglutide , 2 MG/DOSE, (OZEMPIC , 2 MG/DOSE,) 8 MG/3ML SOPN Inject 2 mg into the skin.     Simethicone  80 MG TABS Take 1 tablet (80 mg total) by mouth 3 (three) times daily as needed. 14 tablet 0   Current Facility-Administered Medications on File Prior to Visit  Medication Dose  Route Frequency Provider Last Rate Last Admin   0.9 %  sodium chloride  infusion  500 mL Intravenous Once Mansouraty, Aloha Raddle., MD       No Known Allergies Family History  Problem Relation Age of Onset   Diabetes Brother        Type 2   Esophageal cancer Neg Hx    Stomach cancer Neg Hx    Rectal cancer Neg Hx    Colon polyps Neg Hx    Colon cancer Neg Hx        patient dose not know much about family   PE: BP 120/70   Pulse 87   Ht 5' 4 (1.626 m)   Wt 168 lb 6.4 oz (76.4 kg)   SpO2 97%   BMI 28.91 kg/m  Wt Readings from Last 3 Encounters:  06/23/24 168 lb 6.4 oz (76.4 kg)  02/18/24 169 lb 3.2 oz (76.7 kg)  11/12/23 172 lb 12.8 oz (78.4 kg)   Constitutional: Slightly overweight, in NAD Eyes:  EOMI, no exophthalmos ENT: no neck masses, no cervical lymphadenopathy Cardiovascular: RRR, No MRG Respiratory: CTA B Musculoskeletal: no deformities Skin:no rashes Neurological: no tremor with outstretched hands Diabetic Foot Exam - Simple   Simple Foot Form Diabetic Foot exam was performed with the following findings: Yes 06/23/2024 10:51 AM  Visual Inspection No deformities, no ulcerations, no other skin breakdown bilaterally: Yes Sensation Testing Intact to touch and monofilament testing bilaterally: Yes Pulse Check Posterior Tibialis and Dorsalis pulse intact bilaterally: Yes Comments    ASSESSMENT: 1. DM2, insulin -dependent, uncontrolled, with complications - Albert  2. HL  PLAN:  1. Patient with longstanding,  uncontrolled, type 2 diabetes, on oral antidiabetic regimen with SGLT2 inhibitor, also on weekly GLP-1 receptor agonist and mealtime insulin , with still poor control.  At last visit, HbA1c was 8.3%, only slightly lower than before.  Sugars appear to be improved, with less lows, and less variability.  He did have high blood sugars after breakfast especially after eating cereals, which I advised him to replace with healthier options (for example yogurt with nuts and berries).  Later in the day, he occasionally had higher blood sugars after dinner but overall, the sugars in the evening improved in the week prior to the appointment.  We did not change his regimen at last visit. -Since last visit, he went to [place] and was off Ozempic  for more than a month.  When he restarted it, he had nausea and we decreased the dose to only 1 mg weekly.  He is now feeling well on this and ready to increase the dose back to 2 mg weekly.  He is using higher doses of Lyumjev  for now and we discussed that we can reduce these when increasing the Ozempic  dose.  His HbA1c today is much better (see below), so I did not suggest that the changes in his regimen for now -He is off the sensor as he had several defective ones, but I suspect that this was because he was on an older version of the freestyle [place], the [place] 2.  At today's visit I called in the 3+ sensor for him.  I also downloaded the right application on his phone so he can start checking his blood sugars with his phone. - I suggested to:  Patient Instructions  Please continue: - Farxiga  10 mg daily in am  Please increase: - Ozempic  2 mg weekly  Decrease: - Lyumjev : 14-18 units before b'fast  14-18 units before dinner  Please return in 3-4 months.  - we checked his HbA1c: 7.2% (lower) - advised to check sugars at different times of the day - 4x a day, rotating check times - advised for yearly eye exams >> he is UTD - return to clinic in 3-4 months  2.  HL -Latest lipid panel was reviewed from 03/2023: LDL at goal, triglycerides elevated, HDL low Lab Results  Component Value Date   CHOL 88 04/09/2023   HDL 34 (L) 04/09/2023   LDLCALC 27 04/09/2023   LDLDIRECT 76.0 04/24/2017   TRIG 207 (H) 04/09/2023   CHOLHDL 2.6 04/09/2023  - He continues on Lipitor 10 mg daily without side effects - he had another lipid panel with PCP in 04/2024-will try to get the records  Lela Fendt, MD PhD [facility]

## 2024-06-23 NOTE — Patient Instructions (Addendum)
 Please continue: - Farxiga  10 mg daily in am  Please increase: - Ozempic  2 mg weekly  Decrease: - Lyumjev : 14-18 units before b'fast  14-18 units before dinner  Please return in 3-4 months.

## 2024-08-17 ENCOUNTER — Other Ambulatory Visit: Payer: Self-pay | Admitting: Internal Medicine

## 2024-08-29 ENCOUNTER — Encounter (HOSPITAL_COMMUNITY): Payer: Self-pay

## 2024-08-29 ENCOUNTER — Ambulatory Visit (HOSPITAL_COMMUNITY): Admission: EM | Admit: 2024-08-29 | Discharge: 2024-08-29 | Disposition: A | Discharge status: Home / Self Care

## 2024-08-29 DIAGNOSIS — Z711 Person with feared health complaint in whom no diagnosis is made: Secondary | ICD-10-CM
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
## 2024-08-29 NOTE — Discharge Instructions (Addendum)
 I am glad that you are not having any pain or symptoms.  If anything changes and you develop pain in 1 part of your body, headache, dizziness, nausea, vomiting, numbness or tingling in your hands, weakness you need to go to the emergency room immediately as we discussed.  Follow-up with your primary care next week.
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
## 2024-10-20 ENCOUNTER — Ambulatory Visit: Admitting: Internal Medicine
